# Patient Record
Sex: Male | Born: 1955 | Race: White | Hispanic: No | Marital: Single
Health system: Southern US, Academic
[De-identification: ages and names within clinical notes are randomized; demographics above are authoritative.]

## PROBLEM LIST (undated history)

## (undated) ENCOUNTER — Ambulatory Visit

## (undated) ENCOUNTER — Encounter: Attending: Family | Primary: Family

## (undated) ENCOUNTER — Encounter

## (undated) ENCOUNTER — Encounter
Attending: Student in an Organized Health Care Education/Training Program | Primary: Student in an Organized Health Care Education/Training Program

## (undated) ENCOUNTER — Ambulatory Visit
Payer: MEDICARE | Attending: Student in an Organized Health Care Education/Training Program | Primary: Student in an Organized Health Care Education/Training Program

## (undated) ENCOUNTER — Ambulatory Visit: Payer: MEDICARE

## (undated) ENCOUNTER — Telehealth

## (undated) ENCOUNTER — Encounter: Attending: Infectious Disease | Primary: Infectious Disease

## (undated) ENCOUNTER — Encounter: Payer: MEDICARE | Attending: Family | Primary: Family

## (undated) ENCOUNTER — Ambulatory Visit: Payer: MEDICARE | Attending: Psychiatry | Primary: Psychiatry

## (undated) ENCOUNTER — Ambulatory Visit: Payer: MEDICARE | Attending: Infectious Disease | Primary: Infectious Disease

## (undated) ENCOUNTER — Ambulatory Visit: Payer: MEDICARE | Attending: Family | Primary: Family

## (undated) ENCOUNTER — Encounter: Attending: Psychiatry | Primary: Psychiatry

## (undated) ENCOUNTER — Telehealth: Attending: Family | Primary: Family

## (undated) ENCOUNTER — Encounter: Payer: MEDICARE | Attending: Infectious Disease | Primary: Infectious Disease

## (undated) ENCOUNTER — Encounter
Payer: MEDICARE | Attending: Student in an Organized Health Care Education/Training Program | Primary: Student in an Organized Health Care Education/Training Program

## (undated) ENCOUNTER — Telehealth: Attending: Registered" | Primary: Registered"

## (undated) ENCOUNTER — Non-Acute Institutional Stay: Payer: MEDICARE

## (undated) ENCOUNTER — Encounter: Attending: Registered" | Primary: Registered"

## (undated) DIAGNOSIS — F32A Depression, unspecified: Secondary | ICD-10-CM

## (undated) DIAGNOSIS — B20 Human immunodeficiency virus [HIV] disease: Secondary | ICD-10-CM

## (undated) DIAGNOSIS — E785 Hyperlipidemia, unspecified: Secondary | ICD-10-CM

## (undated) DIAGNOSIS — K769 Liver disease, unspecified: Secondary | ICD-10-CM

## (undated) DIAGNOSIS — F319 Bipolar disorder, unspecified: Secondary | ICD-10-CM

## (undated) DIAGNOSIS — B182 Chronic viral hepatitis C: Secondary | ICD-10-CM

## (undated) DIAGNOSIS — J449 Chronic obstructive pulmonary disease, unspecified: Secondary | ICD-10-CM

## (undated) DIAGNOSIS — F329 Major depressive disorder, single episode, unspecified: Secondary | ICD-10-CM

## (undated) DIAGNOSIS — Z21 Asymptomatic human immunodeficiency virus [HIV] infection status: Secondary | ICD-10-CM

## (undated) HISTORY — PX: BACK SURGERY: SHX140

---
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
## 2005-03-19 ENCOUNTER — Other Ambulatory Visit: Payer: Self-pay

## 2005-03-19 ENCOUNTER — Inpatient Hospital Stay: Payer: Self-pay | Admitting: Internal Medicine

## 2011-12-06 ENCOUNTER — Emergency Department: Payer: Self-pay | Admitting: *Deleted

## 2011-12-06 LAB — COMPREHENSIVE METABOLIC PANEL
Bilirubin,Total: 0.6 mg/dL (ref 0.2–1.0)
Calcium, Total: 9.3 mg/dL (ref 8.5–10.1)
Chloride: 103 mmol/L (ref 98–107)
EGFR (Non-African Amer.): 55 — ABNORMAL LOW
Osmolality: 278 (ref 275–301)
Potassium: 3.7 mmol/L (ref 3.5–5.1)
SGOT(AST): 96 U/L — ABNORMAL HIGH (ref 15–37)
Total Protein: 8.5 g/dL — ABNORMAL HIGH (ref 6.4–8.2)

## 2011-12-06 LAB — CBC
HCT: 42.4 % (ref 40.0–52.0)
HGB: 14.2 g/dL (ref 13.0–18.0)
MCH: 32.7 pg (ref 26.0–34.0)
MCHC: 33.6 g/dL (ref 32.0–36.0)
Platelet: 158 10*3/uL (ref 150–440)
RBC: 4.36 10*6/uL — ABNORMAL LOW (ref 4.40–5.90)
RDW: 13.6 % (ref 11.5–14.5)

## 2011-12-11 LAB — CULTURE, BLOOD (SINGLE)

## 2012-11-27 ENCOUNTER — Emergency Department: Payer: Self-pay | Admitting: Emergency Medicine

## 2012-11-27 LAB — CBC
MCH: 32.6 pg (ref 26.0–34.0)
MCV: 99 fL (ref 80–100)
Platelet: 162 10*3/uL (ref 150–440)
RDW: 14 % (ref 11.5–14.5)
WBC: 5.9 10*3/uL (ref 3.8–10.6)

## 2012-11-27 LAB — BASIC METABOLIC PANEL
Co2: 24 mmol/L (ref 21–32)
Creatinine: 1.23 mg/dL (ref 0.60–1.30)
Glucose: 105 mg/dL — ABNORMAL HIGH (ref 65–99)
Osmolality: 274 (ref 275–301)

## 2013-11-29 ENCOUNTER — Emergency Department: Payer: Self-pay | Admitting: Emergency Medicine

## 2013-11-29 LAB — COMPREHENSIVE METABOLIC PANEL
ALT: 101 U/L — AB (ref 12–78)
Albumin: 3.6 g/dL (ref 3.4–5.0)
Alkaline Phosphatase: 112 U/L
Anion Gap: 5 — ABNORMAL LOW (ref 7–16)
BILIRUBIN TOTAL: 0.7 mg/dL (ref 0.2–1.0)
BUN: 9 mg/dL (ref 7–18)
CALCIUM: 9.5 mg/dL (ref 8.5–10.1)
Chloride: 102 mmol/L (ref 98–107)
Co2: 29 mmol/L (ref 21–32)
Creatinine: 1.43 mg/dL — ABNORMAL HIGH (ref 0.60–1.30)
EGFR (African American): >60
GFR CALC NON AF AMER: 53 — AB
Glucose: 105 mg/dL — ABNORMAL HIGH (ref 65–99)
Osmolality: 271 (ref 275–301)
POTASSIUM: 4.3 mmol/L (ref 3.5–5.1)
SGOT(AST): 134 U/L — ABNORMAL HIGH (ref 15–37)
Sodium: 136 mmol/L (ref 136–145)
Total Protein: 8.2 g/dL (ref 6.4–8.2)

## 2013-11-29 LAB — CBC
HCT: 44.8 % (ref 40.0–52.0)
HGB: 15.3 g/dL (ref 13.0–18.0)
MCH: 33.9 pg (ref 26.0–34.0)
MCHC: 34.1 g/dL (ref 32.0–36.0)
MCV: 99 fL (ref 80–100)
PLATELETS: 120 10*3/uL — AB (ref 150–440)
RBC: 4.5 10*6/uL (ref 4.40–5.90)
RDW: 13.7 % (ref 11.5–14.5)
WBC: 4.2 10*3/uL (ref 3.8–10.6)

## 2013-11-29 LAB — TROPONIN I

## 2013-11-29 LAB — PRO B NATRIURETIC PEPTIDE: B-TYPE NATIURETIC PEPTID: 109 pg/mL (ref 0–125)

## 2015-01-10 ENCOUNTER — Emergency Department: Payer: Self-pay | Admitting: Internal Medicine

## 2015-01-10 LAB — CBC WITH DIFFERENTIAL/PLATELET
BASOS ABS: 0.1 10*3/uL (ref 0.0–0.1)
Basophil %: 1 %
Eosinophil #: 0.3 10*3/uL (ref 0.0–0.7)
Eosinophil %: 3.5 %
HCT: 42 % (ref 40.0–52.0)
HGB: 14.1 g/dL (ref 13.0–18.0)
LYMPHS ABS: 1.7 10*3/uL (ref 1.0–3.6)
Lymphocyte %: 21 %
MCH: 34.8 pg — AB (ref 26.0–34.0)
MCHC: 33.7 g/dL (ref 32.0–36.0)
MCV: 103 fL — ABNORMAL HIGH (ref 80–100)
MONO ABS: 0.7 x10 3/mm (ref 0.2–1.0)
Monocyte %: 8.6 %
NEUTROS ABS: 5.3 10*3/uL (ref 1.4–6.5)
Neutrophil %: 65.9 %
PLATELETS: 120 10*3/uL — AB (ref 150–440)
RBC: 4.06 10*6/uL — ABNORMAL LOW (ref 4.40–5.90)
RDW: 13.8 % (ref 11.5–14.5)
WBC: 8.1 10*3/uL (ref 3.8–10.6)

## 2015-01-10 LAB — BASIC METABOLIC PANEL
Anion Gap: 7 (ref 7–16)
BUN: 13 mg/dL
CO2: 24 mmol/L
Calcium, Total: 9.1 mg/dL
Chloride: 104 mmol/L
Creatinine: 1.13 mg/dL
EGFR (African American): >60
Glucose: 104 mg/dL — ABNORMAL HIGH
Potassium: 4.1 mmol/L
Sodium: 135 mmol/L

## 2015-01-10 LAB — PROTIME-INR
INR: 1.2
Prothrombin Time: 15 secs

## 2015-01-10 LAB — APTT: Activated PTT: 31.5 secs (ref 23.6–35.9)

## 2016-01-11 ENCOUNTER — Emergency Department: Payer: Medicare Other

## 2016-01-11 ENCOUNTER — Encounter: Payer: Self-pay | Admitting: Internal Medicine

## 2016-01-11 ENCOUNTER — Inpatient Hospital Stay
Admission: EM | Admit: 2016-01-11 | Discharge: 2016-01-13 | DRG: 190 | Disposition: A | Discharge status: Home / Self Care | Payer: Medicare Other | Attending: Internal Medicine | Admitting: Internal Medicine

## 2016-01-11 DIAGNOSIS — B20 Human immunodeficiency virus [HIV] disease: Secondary | ICD-10-CM | POA: Diagnosis present

## 2016-01-11 DIAGNOSIS — A084 Viral intestinal infection, unspecified: Secondary | ICD-10-CM | POA: Diagnosis present

## 2016-01-11 DIAGNOSIS — Z716 Tobacco abuse counseling: Secondary | ICD-10-CM

## 2016-01-11 DIAGNOSIS — B182 Chronic viral hepatitis C: Secondary | ICD-10-CM | POA: Diagnosis present

## 2016-01-11 DIAGNOSIS — E785 Hyperlipidemia, unspecified: Secondary | ICD-10-CM | POA: Diagnosis present

## 2016-01-11 DIAGNOSIS — Z23 Encounter for immunization: Secondary | ICD-10-CM | POA: Diagnosis not present

## 2016-01-11 DIAGNOSIS — E871 Hypo-osmolality and hyponatremia: Secondary | ICD-10-CM | POA: Diagnosis present

## 2016-01-11 DIAGNOSIS — F17210 Nicotine dependence, cigarettes, uncomplicated: Secondary | ICD-10-CM | POA: Diagnosis present

## 2016-01-11 DIAGNOSIS — R0902 Hypoxemia: Secondary | ICD-10-CM | POA: Diagnosis present

## 2016-01-11 DIAGNOSIS — Z882 Allergy status to sulfonamides status: Secondary | ICD-10-CM

## 2016-01-11 DIAGNOSIS — J441 Chronic obstructive pulmonary disease with (acute) exacerbation: Principal | ICD-10-CM | POA: Diagnosis present

## 2016-01-11 DIAGNOSIS — Z9889 Other specified postprocedural states: Secondary | ICD-10-CM

## 2016-01-11 DIAGNOSIS — G9340 Encephalopathy, unspecified: Secondary | ICD-10-CM | POA: Diagnosis present

## 2016-01-11 DIAGNOSIS — F1010 Alcohol abuse, uncomplicated: Secondary | ICD-10-CM | POA: Diagnosis present

## 2016-01-11 DIAGNOSIS — Z888 Allergy status to other drugs, medicaments and biological substances status: Secondary | ICD-10-CM

## 2016-01-11 DIAGNOSIS — J449 Chronic obstructive pulmonary disease, unspecified: Secondary | ICD-10-CM

## 2016-01-11 DIAGNOSIS — A0811 Acute gastroenteropathy due to Norwalk agent: Secondary | ICD-10-CM

## 2016-01-11 HISTORY — DX: Human immunodeficiency virus (HIV) disease: B20

## 2016-01-11 HISTORY — DX: Bipolar disorder, unspecified: F31.9

## 2016-01-11 HISTORY — DX: Asymptomatic human immunodeficiency virus (hiv) infection status: Z21

## 2016-01-11 HISTORY — DX: Chronic obstructive pulmonary disease, unspecified: J44.9

## 2016-01-11 HISTORY — DX: Depression, unspecified: F32.A

## 2016-01-11 HISTORY — DX: Liver disease, unspecified: K76.9

## 2016-01-11 HISTORY — DX: Major depressive disorder, single episode, unspecified: F32.9

## 2016-01-11 HISTORY — DX: Chronic viral hepatitis C: B18.2

## 2016-01-11 HISTORY — DX: Hyperlipidemia, unspecified: E78.5

## 2016-01-11 LAB — URINE DRUG SCREEN, QUALITATIVE (ARMC ONLY)
Amphetamines, Ur Screen: NOT DETECTED
BARBITURATES, UR SCREEN: NOT DETECTED
BENZODIAZEPINE, UR SCRN: NOT DETECTED
COCAINE METABOLITE, UR ~~LOC~~: NOT DETECTED
Cannabinoid 50 Ng, Ur ~~LOC~~: POSITIVE — AB
MDMA (Ecstasy)Ur Screen: NOT DETECTED
METHADONE SCREEN, URINE: POSITIVE — AB
Opiate, Ur Screen: NOT DETECTED
Phencyclidine (PCP) Ur S: NOT DETECTED
TRICYCLIC, UR SCREEN: NOT DETECTED

## 2016-01-11 LAB — CBC WITH DIFFERENTIAL/PLATELET
BASOS ABS: 0 10*3/uL (ref 0–0.1)
Basophils Relative: 0 %
EOS PCT: 1 %
Eosinophils Absolute: 0.1 10*3/uL (ref 0–0.7)
HCT: 39.5 % — ABNORMAL LOW (ref 40.0–52.0)
Hemoglobin: 13.3 g/dL (ref 13.0–18.0)
LYMPHS PCT: 11 %
Lymphs Abs: 1.1 10*3/uL (ref 1.0–3.6)
MCH: 34.1 pg — AB (ref 26.0–34.0)
MCHC: 33.5 g/dL (ref 32.0–36.0)
MCV: 101.6 fL — AB (ref 80.0–100.0)
MONO ABS: 1 10*3/uL (ref 0.2–1.0)
MONOS PCT: 10 %
Neutro Abs: 7.9 10*3/uL — ABNORMAL HIGH (ref 1.4–6.5)
Neutrophils Relative %: 78 %
PLATELETS: 119 10*3/uL — AB (ref 150–440)
RBC: 3.89 MIL/uL — ABNORMAL LOW (ref 4.40–5.90)
RDW: 12.9 % (ref 11.5–14.5)
WBC: 10.2 10*3/uL (ref 3.8–10.6)

## 2016-01-11 LAB — BASIC METABOLIC PANEL
ANION GAP: 8 (ref 5–15)
BUN: 14 mg/dL (ref 6–20)
CALCIUM: 9.4 mg/dL (ref 8.9–10.3)
CO2: 24 mmol/L (ref 22–32)
CREATININE: 0.98 mg/dL (ref 0.61–1.24)
Chloride: 102 mmol/L (ref 101–111)
GFR calc Af Amer: >60 mL/min (ref >60)
GLUCOSE: 147 mg/dL — AB (ref 65–99)
Potassium: 4.1 mmol/L (ref 3.5–5.1)
Sodium: 134 mmol/L — ABNORMAL LOW (ref 135–145)

## 2016-01-11 LAB — TROPONIN I: Troponin I: 0.04 ng/mL — ABNORMAL HIGH (ref <0.031)

## 2016-01-11 LAB — ETHANOL

## 2016-01-11 LAB — BLOOD GAS, VENOUS
ACID-BASE EXCESS: 2.7 mmol/L (ref 0.0–3.0)
BICARBONATE: 29.7 meq/L — AB (ref 21.0–28.0)
FIO2: 0.21
O2 SAT: 87.3 %
PATIENT TEMPERATURE: 37
PCO2 VEN: 55 mmHg (ref 44.0–60.0)
PO2 VEN: 57 mmHg — AB (ref 31.0–45.0)
pH, Ven: 7.34 (ref 7.320–7.430)

## 2016-01-11 LAB — BRAIN NATRIURETIC PEPTIDE: B Natriuretic Peptide: 247 pg/mL — ABNORMAL HIGH (ref 0.0–100.0)

## 2016-01-11 MED ORDER — SODIUM CHLORIDE 0.9% FLUSH: 3.0000 mL | INTRAVENOUS | Status: DC | PRN

## 2016-01-11 MED ORDER — SODIUM CHLORIDE 0.9 % IV SOLN: 250.0000 mL | INTRAVENOUS | Status: DC | PRN

## 2016-01-11 MED ORDER — THIAMINE HCL 100 MG/ML IJ SOLN: 100.0000 mg | Freq: Every day | INTRAMUSCULAR | Status: DC

## 2016-01-11 MED ORDER — ACETAMINOPHEN 325 MG PO TABS: 650.0000 mg | ORAL_TABLET | Freq: Four times a day (QID) | ORAL | Status: DC | PRN

## 2016-01-11 MED ORDER — ONDANSETRON HCL 4 MG/2ML IJ SOLN
4.0000 mg | Freq: Once | INTRAMUSCULAR | Status: AC
  Administered 2016-01-11: 4 mg via INTRAVENOUS
  Filled 2016-01-11: qty 2

## 2016-01-11 MED ORDER — IPRATROPIUM-ALBUTEROL 0.5-2.5 (3) MG/3ML IN SOLN
3.0000 mL | Freq: Four times a day (QID) | RESPIRATORY_TRACT | Status: DC
Start: 2016-01-11 — End: 2016-01-11

## 2016-01-11 MED ORDER — SODIUM CHLORIDE 0.9 % IV SOLN
Freq: Once | INTRAVENOUS | Status: AC
  Administered 2016-01-11: 17:00:00 via INTRAVENOUS

## 2016-01-11 MED ORDER — TIOTROPIUM BROMIDE MONOHYDRATE 18 MCG IN CAPS
18.0000 ug | ORAL_CAPSULE | Freq: Every day | RESPIRATORY_TRACT | Status: DC
  Administered 2016-01-12 – 2016-01-13 (×2): 18 ug via RESPIRATORY_TRACT
  Filled 2016-01-11: qty 5

## 2016-01-11 MED ORDER — ENOXAPARIN SODIUM 40 MG/0.4ML ~~LOC~~ SOLN
40.0000 mg | Freq: Every day | SUBCUTANEOUS | Status: DC
  Administered 2016-01-11 – 2016-01-12 (×2): 40 mg via SUBCUTANEOUS
  Filled 2016-01-11 (×2): qty 0.4

## 2016-01-11 MED ORDER — METHYLPREDNISOLONE SODIUM SUCC 125 MG IJ SOLR
125.0000 mg | Freq: Once | INTRAMUSCULAR | Status: AC
  Administered 2016-01-11: 125 mg via INTRAVENOUS
  Filled 2016-01-11: qty 2

## 2016-01-11 MED ORDER — IPRATROPIUM-ALBUTEROL 0.5-2.5 (3) MG/3ML IN SOLN
3.0000 mL | Freq: Four times a day (QID) | RESPIRATORY_TRACT | Status: DC
  Administered 2016-01-12 (×4): 3 mL via RESPIRATORY_TRACT
  Filled 2016-01-11 (×4): qty 3

## 2016-01-11 MED ORDER — VITAMIN B-1 100 MG PO TABS
100.0000 mg | ORAL_TABLET | Freq: Every day | ORAL | Status: DC
  Administered 2016-01-11 – 2016-01-13 (×3): 100 mg via ORAL
  Filled 2016-01-11 (×3): qty 1

## 2016-01-11 MED ORDER — ALBUTEROL SULFATE (2.5 MG/3ML) 0.083% IN NEBU: 2.5000 mg | INHALATION_SOLUTION | RESPIRATORY_TRACT | Status: DC | PRN

## 2016-01-11 MED ORDER — LORAZEPAM 2 MG/ML IJ SOLN: 1.0000 mg | Freq: Four times a day (QID) | INTRAMUSCULAR | Status: DC | PRN

## 2016-01-11 MED ORDER — GUAIFENESIN-DM 100-10 MG/5ML PO SYRP: 5.0000 mL | ORAL_SOLUTION | ORAL | Status: DC | PRN

## 2016-01-11 MED ORDER — LORAZEPAM 1 MG PO TABS: 1.0000 mg | ORAL_TABLET | Freq: Four times a day (QID) | ORAL | Status: DC | PRN

## 2016-01-11 MED ORDER — ONDANSETRON HCL 4 MG/2ML IJ SOLN: 4.0000 mg | Freq: Four times a day (QID) | INTRAMUSCULAR | Status: DC | PRN

## 2016-01-11 MED ORDER — ADULT MULTIVITAMIN W/MINERALS CH
1.0000 | ORAL_TABLET | Freq: Every day | ORAL | Status: DC
  Administered 2016-01-11 – 2016-01-13 (×3): 1 via ORAL
  Filled 2016-01-11 (×3): qty 1

## 2016-01-11 MED ORDER — MOMETASONE FURO-FORMOTEROL FUM 100-5 MCG/ACT IN AERO
2.0000 | INHALATION_SPRAY | Freq: Two times a day (BID) | RESPIRATORY_TRACT | Status: DC
  Administered 2016-01-12 – 2016-01-13 (×4): 2 via RESPIRATORY_TRACT
  Filled 2016-01-11: qty 8.8

## 2016-01-11 MED ORDER — FOLIC ACID 1 MG PO TABS
1.0000 mg | ORAL_TABLET | Freq: Every day | ORAL | Status: DC
  Administered 2016-01-11 – 2016-01-13 (×3): 1 mg via ORAL
  Filled 2016-01-11 (×3): qty 1

## 2016-01-11 MED ORDER — IPRATROPIUM-ALBUTEROL 0.5-2.5 (3) MG/3ML IN SOLN
3.0000 mL | Freq: Once | RESPIRATORY_TRACT | Status: AC
  Administered 2016-01-11: 3 mL via RESPIRATORY_TRACT
  Filled 2016-01-11: qty 3

## 2016-01-11 MED ORDER — ONDANSETRON HCL 4 MG PO TABS: 4.0000 mg | ORAL_TABLET | Freq: Four times a day (QID) | ORAL | Status: DC | PRN

## 2016-01-11 MED ORDER — PNEUMOCOCCAL VAC POLYVALENT 25 MCG/0.5ML IJ INJ
0.5000 mL | INJECTION | INTRAMUSCULAR | Status: AC
  Administered 2016-01-12: 0.5 mL via INTRAMUSCULAR
  Filled 2016-01-11: qty 0.5

## 2016-01-11 MED ORDER — SODIUM CHLORIDE 0.9% FLUSH
3.0000 mL | Freq: Two times a day (BID) | INTRAVENOUS | Status: DC
  Administered 2016-01-11 – 2016-01-13 (×4): 3 mL via INTRAVENOUS

## 2016-01-11 MED ORDER — METHYLPREDNISOLONE SODIUM SUCC 125 MG IJ SOLR
60.0000 mg | Freq: Four times a day (QID) | INTRAMUSCULAR | Status: DC
  Administered 2016-01-12 (×2): 60 mg via INTRAVENOUS
  Filled 2016-01-11 (×3): qty 2

## 2016-01-11 MED ORDER — ACETAMINOPHEN 650 MG RE SUPP: 650.0000 mg | Freq: Four times a day (QID) | RECTAL | Status: DC | PRN

## 2016-01-11 NOTE — H&P (Signed)
[facility] [place] at [facility]   PATIENT NAME: Gregory Robinson Corsetti    MR#:  [id number]  DATE OF BIRTH:  04/15/1956  DATE OF ADMISSION:  01/11/2016  PRIMARY CARE PHYSICIAN: No primary care provider on file.   REQUESTING/REFERRING PHYSICIAN: Emily FilbertJonathan E Williams, MD  CHIEF COMPLAINT:   Chief Complaint  Patient presents with  . Diarrhea  . Emesis   Nausea, vomiting, diarrhea and shortness of breath 3 days. HISTORY OF PRESENT ILLNESS:  Gregory Robinson Doughten  is a 60 y.o. male with a known history of COPD, actually liver disease and hyperlipidemia. The patient came to the ED due to nausea, vomiting and diarrhea. He also complains of cough and shortness of breath. But he denies any fever or chills. He was noticed hypoxic with oxygen level at 80s. He is treated with nebulizer and oxygen in ED. Chest x-ray didn't show any infiltrate. He denies any melena or bloody stool. The patient looks confused. His sister said that it is not his baseline.  PAST MEDICAL HISTORY:   Past Medical History  Diagnosis Date  . COPD (chronic obstructive pulmonary disease) (HCC)   . HIV (human immunodeficiency virus infection) (HCC)   . Liver disease   . Hyperlipidemia   . Depression   . Bipolar affective disorder (HCC)   . Chronic hepatitis C (HCC)     PAST SURGICAL HISTORY:   Past Surgical History  Procedure Laterality Date  . Back surgery      SOCIAL HISTORY:   Social History  Substance Use Topics  . Smoking status: Current Every Day Smoker -- 1.00 packs/day for 30 years  . Smokeless tobacco: Not on file  . Alcohol Use: Yes    FAMILY HISTORY:   Family History  Problem Relation Age of Onset  . Adopted: Yes    DRUG ALLERGIES:   Allergies  Allergen Reactions  . Sulfa Antibiotics     Reaction: unknown    REVIEW OF SYSTEMS:  CONSTITUTIONAL: No fever, fatigue or weakness.  EYES: No blurred or double vision.  EARS, NOSE, AND THROAT: No tinnitus or ear pain.  RESPIRATORY:  No cough, but has shortness of breath, wheezing but no hemoptysis.  CARDIOVASCULAR: No chest pain, orthopnea, edema.  GASTROINTESTINAL: has nausea, vomiting, diarrhea but no abdominal pain.  GENITOURINARY: No dysuria, hematuria.  ENDOCRINE: No polyuria, nocturia,  HEMATOLOGY: No anemia, easy bruising or bleeding SKIN: No rash or lesion. MUSCULOSKELETAL: No joint pain or arthritis.   NEUROLOGIC: No tingling, numbness, weakness.  PSYCHIATRY: No anxiety or depression.   MEDICATIONS AT HOME:   Prior to Admission medications   Not on File      VITAL SIGNS:  Blood pressure 168/79, pulse 97, temperature 99.1 F (37.3 C), temperature source Oral, resp. rate 20, height 6\' 5"  (1.956 m), weight 111.131 kg (245 lb), SpO2 93 %.  PHYSICAL EXAMINATION:  GENERAL:  60 y.o.-year-old patient lying in the bed with no acute distress.  EYES: Pupils equal, round, reactive to light and accommodation. No scleral icterus. Extraocular muscles intact.  HEENT: Head atraumatic, normocephalic. Oropharynx and nasopharynx clear.  NECK:  Supple, no jugular venous distention. No thyroid enlargement, no tenderness.  LUNGS: Normal breath sounds bilaterally, no wheezing, rales,rhonchi or crepitation. No use of accessory muscles of respiration.  CARDIOVASCULAR: S1, S2 normal. No murmurs, rubs, or gallops.  ABDOMEN: Soft, nontender, nondistended. Bowel sounds present. No organomegaly or mass.  EXTREMITIES: No pedal edema, cyanosis, or clubbing.  NEUROLOGIC: Cranial nerves II through XII are intact.  Muscle strength 5/5 in all extremities. Sensation intact. Gait not checked.  PSYCHIATRIC: The patient is alert and oriented x 2.  SKIN: No obvious rash, lesion, or ulcer.   LABORATORY PANEL:   CBC  Recent Labs Lab 01/11/16 1627  WBC 10.2  HGB 13.3  HCT 39.5*  PLT 119*   ------------------------------------------------------------------------------------------------------------------  Chemistries   Recent  Labs Lab 01/11/16 1627  NA 134*  K 4.1  CL 102  CO2 24  GLUCOSE 147*  BUN 14  CREATININE 0.98  CALCIUM 9.4   ------------------------------------------------------------------------------------------------------------------  Cardiac Enzymes  Recent Labs Lab 01/11/16 1627  TROPONINI 0.04*   ------------------------------------------------------------------------------------------------------------------  RADIOLOGY:  Dg Chest 2 View  01/11/2016  CLINICAL DATA:  Dyspnea. EXAM: CHEST  2 VIEW COMPARISON:  November 29, 2013. FINDINGS: The heart size and mediastinal contours are within normal limits. No pneumothorax or pleural effusion is noted. Stable granuloma is noted in right upper lobe. No acute pulmonary disease is noted. The visualized skeletal structures are unremarkable. IMPRESSION: No active cardiopulmonary disease. Electronically Signed   By: Lupita Raider, M.D.   On: 01/11/2016 17:44    EKG:   Orders placed or performed during the hospital encounter of 01/11/16  . ED EKG  . ED EKG    IMPRESSION AND PLAN:   COPD exacerbation with hypoxia. Start Medrol, DuoNeb, continue oxygen by cannula, Spiriva and dulera.  Nausea, vomiting and diarrhea, possible acute gastroenteritis. Give IV fluid support and check stool test.  Altered mental status, acute encephalopathy. Unclear etiology. Aspiration and fall precaution. Check drug screen and alcohol level.  Hyponatremia. Continue normal saline, follow-up BMP.  Alcohol abuse. Start CIWA protocol. Tobacco abuse. Smoking cessation was counseled for 3 minutes, give nicotine patch.   All the records are reviewed and case discussed with ED provider. Management plans discussed with the patient, family and they are in agreement.  CODE STATUS: Full code  TOTAL TIME TAKING CARE OF THIS PATIENT: 57 minutes.    Shaune Pollack M.D on 01/11/2016 at 8:16 PM  Between 7am to 6pm - Pager - [phone]  After 6pm go to [email or web address]  - password [email or web address]  [facility]  Office  [phone]  CC: Primary care physician; No primary care provider on file.

## 2016-01-11 NOTE — ED Provider Notes (Signed)
[facility] Emergency Department Provider Note     Time seen: ----------------------------------------- 4:39 PM on 01/11/2016 -----------------------------------------    I have reviewed the triage vital signs and the nursing notes.   HISTORY  Chief Complaint Diarrhea and Emesis    HPI Gregory Robinson is a 60 y.o. male who arrives by EMS from home for complaints of nausea vomiting and diarrhea. He was noted to be hypoxic on arrival was placed on oxygen. Patient has not noted any significant shortness of breath, states he may have had respiratory problems in the past from HIV. He denies any fevers, chills, chest pain or other complaints at this time.   No past medical history on file.  There are no active problems to display for this patient.   No past surgical history on file.  Allergies Review of patient's allergies indicates not on file.  Social History Social History  Substance Use Topics  . Smoking status: Not on file  . Smokeless tobacco: Not on file  . Alcohol Use: Not on file    Review of Systems Constitutional: Negative for fever. Eyes: Negative for visual changes. ENT: Negative for sore throat. Cardiovascular: Negative for chest pain. Respiratory: Negative for shortness of breath. Gastrointestinal: Negative for abdominal pain, positive for nausea vomiting and diarrhea Genitourinary: Negative for dysuria. Musculoskeletal: Negative for back pain. Skin: Negative for rash. Neurological: Negative for headaches, focal weakness or numbness.  10-point ROS otherwise negative.  ____________________________________________   PHYSICAL EXAM:  VITAL SIGNS: ED Triage Vitals  Enc Vitals Group     BP 01/11/16 1624 110/89 mmHg     Pulse Rate 01/11/16 1624 101     Resp 01/11/16 1624 18     Temp 01/11/16 1624 99.1 F (37.3 C)     Temp Source 01/11/16 1624 Oral     SpO2 01/11/16 1624 90 %     Weight 01/11/16 1624 245 lb (111.131 kg)   Height 01/11/16 1624  (1.956 m)     Head Cir --      Peak Flow --      Pain Score --      Pain Loc --      Pain Edu? --      Excl. in GC? --     Constitutional: Alert and oriented. Well appearing and in no distress. Eyes: Conjunctivae are normal. PERRL. Normal extraocular movements. ENT   Head: Normocephalic and atraumatic.   Nose: No congestion/rhinnorhea.   Mouth/Throat: Mucous membranes are moist.   Neck: No stridor. Cardiovascular: Normal rate, regular rhythm. Normal and symmetric distal pulses are present in all extremities. No murmurs, rubs, or gallops. Respiratory: Normal respiratory effort without tachypnea nor retractions. Breath sounds are clear and equal bilaterally. No wheezes/rales/rhonchi. Gastrointestinal: Soft and nontender. No distention. No abdominal bruits.  Musculoskeletal: Nontender with normal range of motion in all extremities. No joint effusions.  No lower extremity tenderness nor edema. Neurologic:  Normal speech and language. No gross focal neurologic deficits are appreciated.  Skin:  Skin is warm, dry and intact. No rash noted. Psychiatric: Mood and affect are normal. Speech and behavior are normal. Patient exhibits appropriate insight and judgment. ____________________________________________  ED COURSE:  Pertinent labs & imaging results that were available during my care of the patient were reviewed by me and considered in my medical decision making (see chart for details). Patient is in no acute distress, will check basic labs and reevaluate. Patient received fluids antiemetics.  EKG: Interpreted by me, normal sinus rhythm  rate of 91 bpm, normal PR interval, normal QRS, normal QT interval. Left axis deviation, left anterior fascicular block ____________________________________________    LABS (pertinent positives/negatives)  Labs Reviewed  CBC WITH DIFFERENTIAL/PLATELET - Abnormal; Notable for the following:    RBC 3.89 (*)    HCT  39.5 (*)    MCV 101.6 (*)    MCH 34.1 (*)    Platelets 119 (*)    Neutro Abs 7.9 (*)    All other components within normal limits  BASIC METABOLIC PANEL - Abnormal; Notable for the following:    Sodium 134 (*)    Glucose, Bld 147 (*)    All other components within normal limits  BRAIN NATRIURETIC PEPTIDE - Abnormal; Notable for the following:    B Natriuretic Peptide 247.0 (*)    All other components within normal limits  TROPONIN I - Abnormal; Notable for the following:    Troponin I 0.04 (*)    All other components within normal limits  BLOOD GAS, VENOUS - Abnormal; Notable for the following:    pO2, Ven 57.0 (*)    Bicarbonate 29.7 (*)    All other components within normal limits    RADIOLOGY Images were viewed by me  Chest x-ray Is unremarkable ____________________________________________  FINAL ASSESSMENT AND PLAN  Norovirus, hypoxia likely secondary to COPD, elevated troponin  Plan: Patient with labs and imaging as dictated above. Patient with persistent hypoxemia off oxygen which is new for him. Unclear etiology. I don't think he has PCP pneumonia, he's not had any respiratory symptoms. We'll give him do an abscess and Solu-Medrol. His troponin is slightly elevated and this will need to be rechecked. Possible demand related. He has received IV fluids and antiemetics. Currently he is stable for admission.   Emily FilbertWilliams, Jonathan E, MD   Emily FilbertJonathan E Williams, MD 01/11/16 [phone]

## 2016-01-11 NOTE — ED Notes (Signed)
Troponin 0.04. MD notified.  

## 2016-01-11 NOTE — ED Notes (Signed)
Pt arrived via EMS from home for complaints of nausea, vomiting and diarrhea. EMS reports 83% on RA. EMS applied 2L O2, SpO2 up to 90%.

## 2016-01-12 DIAGNOSIS — J441 Chronic obstructive pulmonary disease with (acute) exacerbation: Secondary | ICD-10-CM | POA: Diagnosis not present

## 2016-01-12 LAB — BASIC METABOLIC PANEL
Anion gap: 4 — ABNORMAL LOW (ref 5–15)
BUN: 15 mg/dL (ref 6–20)
CO2: 29 mmol/L (ref 22–32)
CREATININE: 0.98 mg/dL (ref 0.61–1.24)
Calcium: 9 mg/dL (ref 8.9–10.3)
Chloride: 101 mmol/L (ref 101–111)
GFR calc non Af Amer: >60 mL/min (ref >60)
Glucose, Bld: 177 mg/dL — ABNORMAL HIGH (ref 65–99)
Potassium: 4.1 mmol/L (ref 3.5–5.1)
Sodium: 134 mmol/L — ABNORMAL LOW (ref 135–145)

## 2016-01-12 LAB — CBC
HCT: 36.2 % — ABNORMAL LOW (ref 40.0–52.0)
Hemoglobin: 12.3 g/dL — ABNORMAL LOW (ref 13.0–18.0)
MCH: 34.2 pg — AB (ref 26.0–34.0)
MCHC: 34.1 g/dL (ref 32.0–36.0)
MCV: 100.4 fL — ABNORMAL HIGH (ref 80.0–100.0)
PLATELETS: 98 10*3/uL — AB (ref 150–440)
RBC: 3.61 MIL/uL — AB (ref 4.40–5.90)
RDW: 13.2 % (ref 11.5–14.5)
WBC: 5.8 10*3/uL (ref 3.8–10.6)

## 2016-01-12 MED ORDER — PREDNISONE 10 MG (21) PO TBPK: 10.0000 mg | ORAL_TABLET | Freq: Every day | ORAL | Status: DC

## 2016-01-12 MED ORDER — IPRATROPIUM-ALBUTEROL 0.5-2.5 (3) MG/3ML IN SOLN
3.0000 mL | Freq: Three times a day (TID) | RESPIRATORY_TRACT | Status: DC
  Administered 2016-01-13: 3 mL via RESPIRATORY_TRACT
  Filled 2016-01-12: qty 3

## 2016-01-12 MED ORDER — PREDNISONE 20 MG PO TABS
40.0000 mg | ORAL_TABLET | Freq: Every day | ORAL | Status: DC
  Administered 2016-01-13: 08:00:00 40 mg via ORAL
  Filled 2016-01-12: qty 2

## 2016-01-12 MED ORDER — TIOTROPIUM BROMIDE MONOHYDRATE 18 MCG IN CAPS: 18.0000 ug | ORAL_CAPSULE | Freq: Every day | RESPIRATORY_TRACT | Status: DC

## 2016-01-12 MED ORDER — ZOLPIDEM TARTRATE 5 MG PO TABS
10.0000 mg | ORAL_TABLET | Freq: Every evening | ORAL | Status: DC | PRN
  Administered 2016-01-12: 23:00:00 10 mg via ORAL
  Filled 2016-01-12: qty 2

## 2016-01-12 MED ORDER — MOMETASONE FURO-FORMOTEROL FUM 100-5 MCG/ACT IN AERO: 2.0000 | INHALATION_SPRAY | Freq: Two times a day (BID) | RESPIRATORY_TRACT | Status: DC

## 2016-01-12 NOTE — Discharge Summary (Signed)
[facility] [place] at [facility]   PATIENT NAME: Gregory Robinson    MR#:  [id number]  DATE OF BIRTH:  11-Oct-1956  DATE OF ADMISSION:  01/11/2016 ADMITTING PHYSICIAN: Shaune Pollack, MD  DATE OF DISCHARGE: No discharge date for patient encounter.  PRIMARY CARE PHYSICIAN: No primary care provider on file.    ADMISSION DIAGNOSIS:  COPD exacerbation Nausea vomiting diarrhea  DISCHARGE DIAGNOSIS:  Principal Problem:   COPD exacerbation (HCC)  viral gastroenteritis  SECONDARY DIAGNOSIS:   Past Medical History  Diagnosis Date  . COPD (chronic obstructive pulmonary disease) (HCC)   . HIV (human immunodeficiency virus infection) (HCC)   . Liver disease   . Hyperlipidemia   . Depression   . Bipolar affective disorder (HCC)   . Chronic hepatitis C [facility])     HOSPITAL COURSE:  Gregory Robinson  is a 60 y.o. male admitted 01/11/2016 with chief complaint nausea vomiting diarrhea and shortness of breath. Please see H&P performed by Dr. Imogene Burn for further information. Originally patient required oxygen on arrival. However after receiving breathing treatments steroids since symptoms have remarkably improved. He is currently ambulating around the room without oxygen. Has had no further symptoms including nausea vomiting diarrhea or breathing issues. Stable and improved  DISCHARGE CONDITIONS:   Improved/stable   CONSULTS OBTAINED:     DRUG ALLERGIES:   Allergies  Allergen Reactions  . Sulfa Antibiotics     Reaction: unknown    DISCHARGE MEDICATIONS:   Current Discharge Medication List    START taking these medications   Details  mometasone-formoterol (DULERA) 100-5 MCG/ACT AERO Inhale 2 puffs into the lungs 2 (two) times daily. Qty: 8.8 g, Refills: 0    predniSONE (STERAPRED UNI-PAK 21 TAB) 10 MG (21) TBPK tablet Take 1 tablet (10 mg total) by mouth daily.  oral 1 day, then  oral for 2 days, then  oral 2 days, then stop Qty: 10 tablet, Refills: 0     tiotropium (SPIRIVA) 18 MCG inhalation capsule Place 1 capsule (18 mcg total) into inhaler and inhale daily. Qty: 30 capsule, Refills: 12         DISCHARGE INSTRUCTIONS:    DIET:  Regular diet  DISCHARGE CONDITION:  Stable  ACTIVITY:  Activity as tolerated  OXYGEN:  Home Oxygen: No.   Oxygen Delivery: room air  DISCHARGE LOCATION:  home   If you experience worsening of your admission symptoms, develop shortness of breath, life threatening emergency, suicidal or homicidal thoughts you must seek medical attention immediately by calling 911 or calling your MD immediately  if symptoms less severe.  You Must read complete instructions/literature along with all the possible adverse reactions/side effects for all the Medicines you take and that have been prescribed to you. Take any new Medicines after you have completely understood and accpet all the possible adverse reactions/side effects.   Please note  You were cared for by a hospitalist during your hospital stay. If you have any questions about your discharge medications or the care you received while you were in the hospital after you are discharged, you can call the unit and asked to speak with the hospitalist on call if the hospitalist that took care of you is not available. Once you are discharged, your primary care physician will handle any further medical issues. Please note that NO REFILLS for any discharge medications will be authorized once you are discharged, as it is imperative that you return to your primary care physician (or establish a relationship  with a primary care physician if you do not have one) for your aftercare needs so that they can reassess your need for medications and monitor your lab values.    On the day of Discharge:   VITAL SIGNS:  Blood pressure 139/69, pulse 89, temperature 98.8 F (37.1 C), temperature source Oral, resp. rate 18, height 6\' 5"  (1.956 m), weight 112.311 kg (247 lb 9.6 oz),  SpO2 94 %.  I/O:   Intake/Output Summary (Last 24 hours) at 01/12/16 1004 Last data filed at 01/11/16 2300  Gross per 24 hour  Intake    120 ml  Output    400 ml  Net   -280 ml    PHYSICAL EXAMINATION:  GENERAL:  60 y.o.-year-old patient lying in the bed with no acute distress.  EYES: Pupils equal, round, reactive to light and accommodation. No scleral icterus. Extraocular muscles intact.  HEENT: Head atraumatic, normocephalic. Oropharynx and nasopharynx clear.  NECK:  Supple, no jugular venous distention. No thyroid enlargement, no tenderness.  LUNGS: Normal breath sounds bilaterally, no wheezing, rales,rhonchi or crepitation. No use of accessory muscles of respiration.  CARDIOVASCULAR: S1, S2 normal. No murmurs, rubs, or gallops.  ABDOMEN: Soft, non-tender, non-distended. Bowel sounds present. No organomegaly or mass.  EXTREMITIES: No pedal edema, cyanosis, or clubbing.  NEUROLOGIC: Cranial nerves II through XII are intact. Muscle strength 5/5 in all extremities. Sensation intact. Gait not checked.  PSYCHIATRIC: The patient is alert and oriented x 3.  SKIN: No obvious rash, lesion, or ulcer.   DATA REVIEW:   CBC  Recent Labs Lab 01/12/16 0444  WBC 5.8  HGB 12.3*  HCT 36.2*  PLT 98*    Chemistries   Recent Labs Lab 01/12/16 0444  NA 134*  K 4.1  CL 101  CO2 29  GLUCOSE 177*  BUN 15  CREATININE 0.98  CALCIUM 9.0    Cardiac Enzymes  Recent Labs Lab 01/11/16 1627  TROPONINI 0.04*    Microbiology Results  Results for orders placed or performed in visit on 12/06/11  Culture, blood (single)     Status: None   Collection Time: 12/06/11 12:57 AM  Result Value Ref Range Status   Micro Text Report   Final       COMMENT                   NO GROWTH AEROBICALLY/ANAEROBICALLY IN 5 DAYS   ANTIBIOTIC                                                      Culture, blood (single)     Status: None   Collection Time: 12/06/11 12:57 AM  Result Value Ref Range  Status   Micro Text Report   Final       COMMENT                   NO GROWTH AEROBICALLY/ANAEROBICALLY IN 5 DAYS   ANTIBIOTIC                                                        RADIOLOGY:  Dg Chest 2 View  01/11/2016  CLINICAL DATA:  Dyspnea. EXAM: CHEST  2 VIEW COMPARISON:  November 29, 2013. FINDINGS: The heart size and mediastinal contours are within normal limits. No pneumothorax or pleural effusion is noted. Stable granuloma is noted in right upper lobe. No acute pulmonary disease is noted. The visualized skeletal structures are unremarkable. IMPRESSION: No active cardiopulmonary disease. Electronically Signed   By: Lupita Raider, M.D.   On: 01/11/2016 17:44     Management plans discussed with the patient, family and they are in agreement.  CODE STATUS:     Code Status Orders        Start     Ordered   01/11/16 2131  Full code   Continuous     01/11/16 2130    Code Status History    Date Active Date Inactive Code Status Order ID Comments User Context   This patient has a current code status but no historical code status.      TOTAL TIME TAKING CARE OF THIS PATIENT: 28 minutes.    Hower,  Mardi Mainland.D on 01/12/2016 at 10:04 AM  Between 7am to 6pm - Pager - [phone]  After 6pm go to [email or web address] - password [email or web address] [facility]  [place] [facility]  Office  [phone]  CC: Primary care physician; No primary care provider on file.

## 2016-01-12 NOTE — Progress Notes (Signed)
Tobacco abuse counseling: Time spent greater than 10 minutes, Patient smokes approximately one pack daily but about quitting before we talked about various options including nicotine supplementation, medications, at this time patient prefers to attempt using electronic cigarettes-stated that this method is not officially proven however it does provide decrease exposure to toxins it is better than his current situation but not as good as complete abstinence

## 2016-01-12 NOTE — Progress Notes (Signed)
Patient desaturate to 85% room air at rest... Will hold on discharge today

## 2016-01-12 NOTE — Care Management Important Message (Signed)
Important Message  Patient Details  Name: Gregory Rochesterric Adriance MRN: [id number] Date of Birth: 08/18/1956   Medicare Important Message Given:  Yes    Gwenette GreetBrenda S Kurstyn Larios, RN 01/12/2016, 11:20 AM

## 2016-01-12 NOTE — Plan of Care (Signed)
Problem: Discharge Progression Outcomes Goal: O2 sats > or equal 90% or at baseline Outcome: Not Progressing Around noon O2 sats in the mid 80's on room air. 3L O2 per [place] reapplied, O2 sats up to 93%. MD aware.

## 2016-01-12 NOTE — Care Management (Signed)
Admitted to [facility] with the diagnosis of COPD. Lives with friend, Gregory Robinson [phone])Takes care of all basic and instrumental activities of daily living himself, drives. Home oxygen through [employer] 20 years ago. Uses no aids for ambulation. Retired from [employer] 2003. No falls. No seizure activity. Good appetite. Either friend, Gregory Robinson or sister will transport. Gwenette GreetBrenda S Kaile Bixler RN MSN CCM Care Management [phone]

## 2016-01-12 NOTE — Progress Notes (Signed)
[facility] - [place] at [facility]   PATIENT NAME: Gregory Robinson    MR#:  [id number]  DATE OF BIRTH:  05/24/1956  SUBJECTIVE:  No complaints this morning no further episodes of diarrhea  REVIEW OF SYSTEMS:  CONSTITUTIONAL: No fever, fatigue or weakness.  EYES: No blurred or double vision.  EARS, NOSE, AND THROAT: No tinnitus or ear pain.  RESPIRATORY: No cough, shortness of breath, wheezing or hemoptysis.  CARDIOVASCULAR: No chest pain, orthopnea, edema.  GASTROINTESTINAL: No nausea, vomiting, diarrhea or abdominal pain.  GENITOURINARY: No dysuria, hematuria.  ENDOCRINE: No polyuria, nocturia,  HEMATOLOGY: No anemia, easy bruising or bleeding SKIN: No rash or lesion. MUSCULOSKELETAL: No joint pain or arthritis.   NEUROLOGIC: No tingling, numbness, weakness.  PSYCHIATRY: No anxiety or depression.   DRUG ALLERGIES:   Allergies  Allergen Reactions  . Sulfa Antibiotics     Reaction: unknown    VITALS:  Blood pressure 155/63, pulse 85, temperature 99.1 F (37.3 C), temperature source Oral, resp. rate 18, height 6\' 5"  (1.956 m), weight 112.311 kg (247 lb 9.6 oz), SpO2 88 %.  PHYSICAL EXAMINATION:  VITAL SIGNS: Filed Vitals:   01/12/16 0441 01/12/16 1326  BP: 139/69 155/63  Pulse: 89 85  Temp: 98.8 F (37.1 C) 99.1 F (37.3 C)  Resp: 18 18   GENERAL:59 y.o.male currently in no acute distress.  HEAD: Normocephalic, atraumatic.  EYES: Pupils equal, round, reactive to light. Extraocular muscles intact. No scleral icterus.  MOUTH: Moist mucosal membrane. Dentition intact. No abscess noted.  EAR, NOSE, THROAT: Clear without exudates. No external lesions.  NECK: Supple. No thyromegaly. No nodules. No JVD.  PULMONARY: Clear to ascultation, without wheeze rails or rhonci. No use of accessory muscles, Good respiratory effort. good air entry bilaterally CHEST: Nontender to palpation.  CARDIOVASCULAR: S1 and S2. Regular rate and rhythm. No murmurs, rubs, or  gallops. No edema. Pedal pulses 2+ bilaterally.  GASTROINTESTINAL: Soft, nontender, nondistended. No masses. Positive bowel sounds. No hepatosplenomegaly.  MUSCULOSKELETAL: No swelling, clubbing, or edema. Range of motion full in all extremities.  NEUROLOGIC: Cranial nerves II through XII are intact. No gross focal neurological deficits. Sensation intact. Reflexes intact.  SKIN: No ulceration, lesions, rashes, or cyanosis. Skin warm and dry. Turgor intact.  PSYCHIATRIC: Mood, affect within normal limits. The patient is awake, alert and oriented x 3. Insight, judgment intact.      LABORATORY PANEL:   CBC  Recent Labs Lab 01/12/16 0444  WBC 5.8  HGB 12.3*  HCT 36.2*  PLT 98*   ------------------------------------------------------------------------------------------------------------------  Chemistries   Recent Labs Lab 01/12/16 0444  NA 134*  K 4.1  CL 101  CO2 29  GLUCOSE 177*  BUN 15  CREATININE 0.98  CALCIUM 9.0   ------------------------------------------------------------------------------------------------------------------  Cardiac Enzymes  Recent Labs Lab 01/11/16 1627  TROPONINI 0.04*   ------------------------------------------------------------------------------------------------------------------  RADIOLOGY:  Dg Chest 2 View  01/11/2016  CLINICAL DATA:  Dyspnea. EXAM: CHEST  2 VIEW COMPARISON:  November 29, 2013. FINDINGS: The heart size and mediastinal contours are within normal limits. No pneumothorax or pleural effusion is noted. Stable granuloma is noted in right upper lobe. No acute pulmonary disease is noted. The visualized skeletal structures are unremarkable. IMPRESSION: No active cardiopulmonary disease. Electronically Signed   By: Lupita RaiderJames  Green Jr, M.D.   On: 01/11/2016 17:44    EKG:   Orders placed or performed during the hospital encounter of 01/11/16  . ED EKG  . ED EKG    ASSESSMENT AND  PLAN:   60 year old Caucasian gentleman  admitted with nausea vomiting diarrhea shortness of breath  1. COPD exacerbation: Continue with steroids and supplemental oxygen as required patient doing remarkably well this morning however on a recheck of oxygen desaturated to mid 80s on room air will continue with steroids breathing treatments 2. Nausea vomiting diarrhea: Symptoms resolved 3. Hyponatremia: IV fluid hydration     All the records are reviewed and case discussed with Care Management/Social Workerr. Management plans discussed with the patient, family and they are in agreement.  CODE STATUS: Full  TOTAL TIME TAKING CARE OF THIS PATIENT: 28 minutes.   POSSIBLE D/C IN 1 DAYS, DEPENDING ON CLINICAL CONDITION.   Hazelynn Mckenny,  Mardi Mainland.D on 01/12/2016 at 3:42 PM  Between 7am to 6pm - Pager - [phone]  After 6pm: House Pager: - [phone]  [facility]  Office  [phone]  CC: Primary care physician; No primary care provider on file.

## 2016-01-13 NOTE — Progress Notes (Signed)
Received MD order to discharge patient to home, reviewed home medications, prescriptions and discharge instructions with patient and patient verbalized understanding discharged to home in wheelchair with sister by nursing

## 2016-01-13 NOTE — Discharge Summary (Signed)
[facility] - [place] at [facility]   PATIENT NAME: Gregory Robinson    MR#:  [id number]  DATE OF BIRTH:  1956-02-15  DATE OF ADMISSION:  01/11/2016 ADMITTING PHYSICIAN: Shaune Pollack, MD  DATE OF DISCHARGE: No discharge date for patient encounter.  PRIMARY CARE PHYSICIAN: No primary care provider on file.    ADMISSION DIAGNOSIS:  COPD exacerbation Nausea vomiting diarrhea  DISCHARGE DIAGNOSIS:  Principal Problem:   COPD exacerbation (HCC)  viral gastroenteritis  SECONDARY DIAGNOSIS:   Past Medical History  Diagnosis Date  . COPD (chronic obstructive pulmonary disease) (HCC)   . HIV (human immunodeficiency virus infection) (HCC)   . Liver disease   . Hyperlipidemia   . Depression   . Bipolar affective disorder (HCC)   . Chronic hepatitis C [facility])     HOSPITAL COURSE:  Gregory Robinson  is a 60 y.o. male admitted 01/11/2016 with chief complaint nausea vomiting diarrhea and shortness of breath. Please see H&P performed by Dr. Imogene Burn for further information. Originally patient required oxygen on arrival. However after receiving breathing treatments steroids since symptoms have remarkably improved. He is currently ambulating around the room without oxygen. Has had no further symptoms including nausea vomiting diarrhea or breathing issues. Stable and improved  DISCHARGE CONDITIONS:   Improved/stable   CONSULTS OBTAINED:     DRUG ALLERGIES:   Allergies  Allergen Reactions  . Sulfa Antibiotics     Reaction: unknown    DISCHARGE MEDICATIONS:   Current Discharge Medication List    START taking these medications   Details  mometasone-formoterol (DULERA) 100-5 MCG/ACT AERO Inhale 2 puffs into the lungs 2 (two) times daily. Qty: 8.8 g, Refills: 0    predniSONE (STERAPRED UNI-PAK 21 TAB) 10 MG (21) TBPK tablet Take 1 tablet (10 mg total) by mouth daily.  oral 1 day, then  oral for 2 days, then  oral 2 days, then stop Qty: 10 tablet, Refills: 0     tiotropium (SPIRIVA) 18 MCG inhalation capsule Place 1 capsule (18 mcg total) into inhaler and inhale daily. Qty: 30 capsule, Refills: 12         DISCHARGE INSTRUCTIONS:    DIET:  Regular diet  DISCHARGE CONDITION:  Stable  ACTIVITY:  Activity as tolerated  OXYGEN:  Home Oxygen: No.   Oxygen Delivery: room air  DISCHARGE LOCATION:  home   If you experience worsening of your admission symptoms, develop shortness of breath, life threatening emergency, suicidal or homicidal thoughts you must seek medical attention immediately by calling 911 or calling your MD immediately  if symptoms less severe.  You Must read complete instructions/literature along with all the possible adverse reactions/side effects for all the Medicines you take and that have been prescribed to you. Take any new Medicines after you have completely understood and accpet all the possible adverse reactions/side effects.   Please note  You were cared for by a hospitalist during your hospital stay. If you have any questions about your discharge medications or the care you received while you were in the hospital after you are discharged, you can call the unit and asked to speak with the hospitalist on call if the hospitalist that took care of you is not available. Once you are discharged, your primary care physician will handle any further medical issues. Please note that NO REFILLS for any discharge medications will be authorized once you are discharged, as it is imperative that you return to your primary care physician (or establish a relationship  with a primary care physician if you do not have one) for your aftercare needs so that they can reassess your need for medications and monitor your lab values.    On the day of Discharge:   VITAL SIGNS:  Blood pressure 151/61, pulse 100, temperature 98.4 F (36.9 C), temperature source Oral, resp. rate 20, height 6\' 5"  (1.956 m), weight 112.311 kg (247 lb 9.6 oz),  SpO2 94 %.  I/O:    Intake/Output Summary (Last 24 hours) at 01/13/16 1146 Last data filed at 01/13/16 0116  Gross per 24 hour  Intake      0 ml  Output   1300 ml  Net  -1300 ml    PHYSICAL EXAMINATION:  GENERAL:  60 y.o.-year-old patient lying in the bed with no acute distress.  EYES: Pupils equal, round, reactive to light and accommodation. No scleral icterus. Extraocular muscles intact.  HEENT: Head atraumatic, normocephalic. Oropharynx and nasopharynx clear.  NECK:  Supple, no jugular venous distention. No thyroid enlargement, no tenderness.  LUNGS: Normal breath sounds bilaterally, no wheezing, rales,rhonchi or crepitation. No use of accessory muscles of respiration.  CARDIOVASCULAR: S1, S2 normal. No murmurs, rubs, or gallops.  ABDOMEN: Soft, non-tender, non-distended. Bowel sounds present. No organomegaly or mass.  EXTREMITIES: No pedal edema, cyanosis, or clubbing.  NEUROLOGIC: Cranial nerves II through XII are intact. Muscle strength 5/5 in all extremities. Sensation intact. Gait not checked.  PSYCHIATRIC: The patient is alert and oriented x 3.  SKIN: No obvious rash, lesion, or ulcer.   DATA REVIEW:   CBC  Recent Labs Lab 01/12/16 0444  WBC 5.8  HGB 12.3*  HCT 36.2*  PLT 98*    Chemistries   Recent Labs Lab 01/12/16 0444  NA 134*  K 4.1  CL 101  CO2 29  GLUCOSE 177*  BUN 15  CREATININE 0.98  CALCIUM 9.0    Cardiac Enzymes  Recent Labs Lab 01/11/16 1627  TROPONINI 0.04*    Microbiology Results  Results for orders placed or performed in visit on 12/06/11  Culture, blood (single)     Status: None   Collection Time: 12/06/11 12:57 AM  Result Value Ref Range Status   Micro Text Report   Final       COMMENT                   NO GROWTH AEROBICALLY/ANAEROBICALLY IN 5 DAYS   ANTIBIOTIC                                                      Culture, blood (single)     Status: None   Collection Time: 12/06/11 12:57 AM  Result Value Ref Range  Status   Micro Text Report   Final       COMMENT                   NO GROWTH AEROBICALLY/ANAEROBICALLY IN 5 DAYS   ANTIBIOTIC                                                        RADIOLOGY:  Dg Chest 2 View  01/11/2016  CLINICAL DATA:  Dyspnea. EXAM: CHEST  2 VIEW COMPARISON:  November 29, 2013. FINDINGS: The heart size and mediastinal contours are within normal limits. No pneumothorax or pleural effusion is noted. Stable granuloma is noted in right upper lobe. No acute pulmonary disease is noted. The visualized skeletal structures are unremarkable. IMPRESSION: No active cardiopulmonary disease. Electronically Signed   By: Lupita Raider, M.D.   On: 01/11/2016 17:44     Management plans discussed with the patient, family and they are in agreement.  CODE STATUS:     Code Status Orders        Start     Ordered   01/11/16 2131  Full code   Continuous     01/11/16 2130    Code Status History    Date Active Date Inactive Code Status Order ID Comments User Context   This patient has a current code status but no historical code status.      TOTAL TIME TAKING CARE OF THIS PATIENT: 28 minutes.    Angel Hobdy,  Mardi Mainland.D on 01/13/2016 at 11:46 AM  Between 7am to 6pm - Pager - [phone]  After 6pm go to [email or web address] - password [email or web address] [facility]  [place] [facility]  Office  [phone]  CC: Primary care physician; No primary care provider on file.

## 2016-03-16 NOTE — Unmapped (Signed)
Received two notifications from [facility] regarding dolutegravir and lamotrigine. Notifications stated that there was concern that the patient was non-adherent to these medications based on prescription refill history.     Lamotrigine was last filled on 02/26/16 and this has been filled regularly per [facility] pharmacy.    Patient recently changed from integrase inhibitors on 01/22/16 from [facility]ent[facility] to Tivicay to simplify regimen to daily administration. Prescription refill history has been accurate with fill dates and patient reports adherence. HIV VL will be checked at next office visit on 03/30/16.    No acute issues in regards to non-adherence to these medications.     Andrew Oconnell. Junius Creamer, PharmD, BCPS, AAHIVP, CPP  PGY2 Infectious Diseases Specialty Pharmacy Resident  Pager: [phone]

## 2017-05-02 ENCOUNTER — Ambulatory Visit
Admission: RE | Admit: 2017-05-02 | Discharge: 2017-05-02 | Disposition: A | Discharge status: Home / Self Care | Payer: MEDICARE | Admitting: Urology

## 2017-05-02 DIAGNOSIS — N401 Enlarged prostate with lower urinary tract symptoms: Principal | ICD-10-CM

## 2017-05-02 DIAGNOSIS — B20 Human immunodeficiency virus [HIV] disease: Secondary | ICD-10-CM

## 2017-05-02 DIAGNOSIS — Z125 Encounter for screening for malignant neoplasm of prostate: Secondary | ICD-10-CM

## 2017-05-02 MED ORDER — TAMSULOSIN 0.4 MG CAPSULE
ORAL_CAPSULE | Freq: Every day | ORAL | 3 refills | 0 days | Status: CP
Start: 2017-05-02 — End: 2018-05-02

## 2017-07-10 ENCOUNTER — Ambulatory Visit
Admission: RE | Admit: 2017-07-10 | Discharge: 2017-07-10 | Disposition: A | Discharge status: Home / Self Care | Payer: MEDICARE

## 2017-07-10 ENCOUNTER — Ambulatory Visit
Admission: RE | Admit: 2017-07-10 | Discharge: 2017-07-10 | Disposition: A | Discharge status: Home / Self Care | Payer: MEDICARE | Attending: Family | Admitting: Family

## 2017-07-10 DIAGNOSIS — R1310 Dysphagia, unspecified: Secondary | ICD-10-CM

## 2017-07-10 DIAGNOSIS — Z129 Encounter for screening for malignant neoplasm, site unspecified: Secondary | ICD-10-CM

## 2017-07-10 DIAGNOSIS — B20 Human immunodeficiency virus [HIV] disease: Secondary | ICD-10-CM

## 2017-07-10 DIAGNOSIS — N401 Enlarged prostate with lower urinary tract symptoms: Secondary | ICD-10-CM

## 2017-07-10 DIAGNOSIS — H53029 Refractive amblyopia, unspecified eye: Secondary | ICD-10-CM

## 2017-07-10 DIAGNOSIS — E7800 Pure hypercholesterolemia, unspecified: Secondary | ICD-10-CM

## 2017-07-10 DIAGNOSIS — B182 Chronic viral hepatitis C: Secondary | ICD-10-CM

## 2017-07-10 DIAGNOSIS — L853 Xerosis cutis: Secondary | ICD-10-CM

## 2017-07-10 DIAGNOSIS — H44119 Panuveitis, unspecified eye: Secondary | ICD-10-CM

## 2017-07-10 DIAGNOSIS — K7460 Unspecified cirrhosis of liver: Principal | ICD-10-CM

## 2017-07-10 DIAGNOSIS — N189 Chronic kidney disease, unspecified: Secondary | ICD-10-CM

## 2017-07-10 DIAGNOSIS — M25512 Pain in left shoulder: Secondary | ICD-10-CM

## 2017-07-10 DIAGNOSIS — Z720 Tobacco use: Secondary | ICD-10-CM

## 2017-07-10 DIAGNOSIS — F3170 Bipolar disorder, currently in remission, most recent episode unspecified: Secondary | ICD-10-CM

## 2017-07-10 DIAGNOSIS — R0602 Shortness of breath: Secondary | ICD-10-CM

## 2017-07-10 DIAGNOSIS — G8929 Other chronic pain: Secondary | ICD-10-CM

## 2017-07-10 DIAGNOSIS — N521 Erectile dysfunction due to diseases classified elsewhere: Secondary | ICD-10-CM

## 2017-07-10 DIAGNOSIS — J42 Unspecified chronic bronchitis: Secondary | ICD-10-CM

## 2017-07-10 DIAGNOSIS — F5101 Primary insomnia: Secondary | ICD-10-CM

## 2017-07-10 DIAGNOSIS — I77810 Thoracic aortic ectasia: Secondary | ICD-10-CM

## 2017-07-18 ENCOUNTER — Ambulatory Visit
Admission: RE | Admit: 2017-07-18 | Discharge: 2017-07-18 | Disposition: A | Discharge status: Home / Self Care | Payer: MEDICARE | Attending: Family | Admitting: Family

## 2017-07-18 DIAGNOSIS — B182 Chronic viral hepatitis C: Principal | ICD-10-CM

## 2017-07-18 DIAGNOSIS — K7460 Unspecified cirrhosis of liver: Secondary | ICD-10-CM

## 2017-07-18 DIAGNOSIS — F329 Major depressive disorder, single episode, unspecified: Secondary | ICD-10-CM

## 2017-08-02 ENCOUNTER — Ambulatory Visit: Admission: RE | Admit: 2017-08-02 | Discharge: 2017-08-02 | Payer: MEDICARE | Attending: Clinical | Admitting: Clinical

## 2017-08-02 ENCOUNTER — Ambulatory Visit: Admission: RE | Admit: 2017-08-02 | Discharge: 2017-08-02 | Payer: MEDICARE

## 2017-08-02 DIAGNOSIS — F4320 Adjustment disorder, unspecified: Secondary | ICD-10-CM

## 2017-08-02 DIAGNOSIS — Z634 Disappearance and death of family member: Secondary | ICD-10-CM

## 2017-08-02 DIAGNOSIS — F329 Major depressive disorder, single episode, unspecified: Principal | ICD-10-CM

## 2017-08-24 ENCOUNTER — Ambulatory Visit: Admission: RE | Admit: 2017-08-24 | Discharge: 2017-08-24

## 2017-08-24 DIAGNOSIS — Z006 Encounter for examination for normal comparison and control in clinical research program: Principal | ICD-10-CM

## 2017-08-30 MED ORDER — TRAZODONE 150 MG TABLET
ORAL_TABLET | 6 refills | 0 days | Status: CP
Start: 2017-08-30 — End: 2018-02-13

## 2017-09-19 MED ORDER — LAMOTRIGINE 200 MG TABLET
ORAL_TABLET | 5 refills | 0 days | Status: CP
Start: 2017-09-19 — End: 2018-02-13

## 2017-10-24 ENCOUNTER — Encounter
Admit: 2017-10-24 | Discharge: 2017-10-25 | Payer: MEDICARE | Attending: Infectious Disease | Primary: Infectious Disease

## 2017-10-24 DIAGNOSIS — B182 Chronic viral hepatitis C: Secondary | ICD-10-CM

## 2017-10-24 DIAGNOSIS — B20 Human immunodeficiency virus [HIV] disease: Principal | ICD-10-CM

## 2017-11-23 MED ORDER — LOSARTAN 25 MG TABLET
ORAL_TABLET | 11 refills | 0 days | Status: CP
Start: 2017-11-23 — End: 2018-12-12

## 2017-12-11 MED ORDER — EMTRICITABINE 200 MG-TENOFOVIR ALAFENAMIDE FUMARATE 25 MG TABLET
ORAL_TABLET | Freq: Every day | ORAL | 11 refills | 0.00000 days | Status: CP
Start: 2017-12-11 — End: 2018-12-12

## 2017-12-11 MED ORDER — DOLUTEGRAVIR 50 MG TABLET
ORAL_TABLET | Freq: Every day | ORAL | 11 refills | 0.00000 days | Status: CP
Start: 2017-12-11 — End: 2018-12-12

## 2017-12-26 MED ORDER — ATORVASTATIN 10 MG TABLET
ORAL_TABLET | 6 refills | 0 days | Status: CP
Start: 2017-12-26 — End: 2018-08-29

## 2018-01-23 ENCOUNTER — Encounter: Admit: 2018-01-23 | Discharge: 2018-01-23 | Payer: MEDICARE

## 2018-01-23 ENCOUNTER — Encounter: Admit: 2018-01-23 | Discharge: 2018-01-23 | Payer: MEDICARE | Attending: Family | Primary: Family

## 2018-01-23 DIAGNOSIS — K7460 Unspecified cirrhosis of liver: Principal | ICD-10-CM

## 2018-01-23 DIAGNOSIS — Z13820 Encounter for screening for osteoporosis: Secondary | ICD-10-CM

## 2018-01-23 DIAGNOSIS — M899 Disorder of bone, unspecified: Secondary | ICD-10-CM

## 2018-01-24 MED ORDER — ERGOCALCIFEROL (VITAMIN D2) 1,250 MCG (50,000 UNIT) CAPSULE
ORAL_CAPSULE | ORAL | 2 refills | 0 days | Status: CP
Start: 2018-01-24 — End: 2019-03-18

## 2018-02-13 MED ORDER — LAMOTRIGINE 200 MG TABLET
ORAL_TABLET | 5 refills | 0 days | Status: CP
Start: 2018-02-13 — End: 2018-08-15

## 2018-02-13 MED ORDER — TRAZODONE 150 MG TABLET
ORAL_TABLET | 6 refills | 0 days | Status: CP
Start: 2018-02-13 — End: 2018-09-03

## 2018-03-13 ENCOUNTER — Encounter
Admit: 2018-03-13 | Discharge: 2018-03-13 | Payer: MEDICARE | Attending: Infectious Disease | Primary: Infectious Disease

## 2018-03-13 DIAGNOSIS — J42 Unspecified chronic bronchitis: Secondary | ICD-10-CM

## 2018-03-13 DIAGNOSIS — B20 Human immunodeficiency virus [HIV] disease: Principal | ICD-10-CM

## 2018-03-13 DIAGNOSIS — B182 Chronic viral hepatitis C: Secondary | ICD-10-CM

## 2018-06-25 ENCOUNTER — Encounter: Admit: 2018-06-25 | Discharge: 2018-06-26 | Payer: MEDICARE

## 2018-06-25 DIAGNOSIS — J069 Acute upper respiratory infection, unspecified: Principal | ICD-10-CM

## 2018-06-25 DIAGNOSIS — J0100 Acute maxillary sinusitis, unspecified: Secondary | ICD-10-CM

## 2018-06-25 DIAGNOSIS — B20 Human immunodeficiency virus [HIV] disease: Secondary | ICD-10-CM

## 2018-06-25 MED ORDER — AMOXICILLIN 875 MG-POTASSIUM CLAVULANATE 125 MG TABLET
ORAL_TABLET | Freq: Two times a day (BID) | ORAL | 0 refills | 0 days | Status: CP
Start: 2018-06-25 — End: 2018-07-02

## 2018-07-31 ENCOUNTER — Ambulatory Visit: Admit: 2018-07-31 | Discharge: 2018-07-31 | Payer: MEDICARE

## 2018-07-31 ENCOUNTER — Encounter: Admit: 2018-07-31 | Discharge: 2018-07-31 | Payer: MEDICARE

## 2018-07-31 ENCOUNTER — Ambulatory Visit: Admit: 2018-07-31 | Discharge: 2018-07-31 | Payer: MEDICARE | Attending: Family | Primary: Family

## 2018-07-31 DIAGNOSIS — Z13820 Encounter for screening for osteoporosis: Secondary | ICD-10-CM

## 2018-07-31 DIAGNOSIS — R1310 Dysphagia, unspecified: Secondary | ICD-10-CM

## 2018-07-31 DIAGNOSIS — M899 Disorder of bone, unspecified: Principal | ICD-10-CM

## 2018-07-31 DIAGNOSIS — K222 Esophageal obstruction: Principal | ICD-10-CM

## 2018-07-31 DIAGNOSIS — Z23 Encounter for immunization: Secondary | ICD-10-CM

## 2018-07-31 DIAGNOSIS — K7460 Unspecified cirrhosis of liver: Secondary | ICD-10-CM

## 2018-07-31 DIAGNOSIS — Z8619 Personal history of other infectious and parasitic diseases: Secondary | ICD-10-CM

## 2018-08-16 MED ORDER — LAMOTRIGINE 200 MG TABLET
5 refills | 0 days | Status: CP
Start: 2018-08-16 — End: 2018-12-12

## 2018-08-29 MED ORDER — ATORVASTATIN 10 MG TABLET
5 refills | 0 days | Status: CP
Start: 2018-08-29 — End: 2019-01-31

## 2018-09-04 MED ORDER — TRAZODONE 150 MG TABLET
ORAL_TABLET | 6 refills | 0 days | Status: CP
Start: 2018-09-04 — End: 2019-03-17

## 2018-10-12 ENCOUNTER — Ambulatory Visit: Admit: 2018-10-12 | Discharge: 2018-10-12 | Payer: MEDICARE

## 2018-10-12 ENCOUNTER — Encounter
Admit: 2018-10-12 | Discharge: 2018-10-12 | Payer: MEDICARE | Attending: Certified Registered" | Primary: Certified Registered"

## 2018-10-12 DIAGNOSIS — R1310 Dysphagia, unspecified: Principal | ICD-10-CM

## 2018-10-23 ENCOUNTER — Ambulatory Visit
Admit: 2018-10-23 | Discharge: 2018-10-24 | Payer: MEDICARE | Attending: Infectious Disease | Primary: Infectious Disease

## 2018-10-23 DIAGNOSIS — B182 Chronic viral hepatitis C: Secondary | ICD-10-CM

## 2018-10-23 DIAGNOSIS — J209 Acute bronchitis, unspecified: Secondary | ICD-10-CM

## 2018-10-23 DIAGNOSIS — F5101 Primary insomnia: Secondary | ICD-10-CM

## 2018-10-23 DIAGNOSIS — Z720 Tobacco use: Secondary | ICD-10-CM

## 2018-10-23 DIAGNOSIS — B20 Human immunodeficiency virus [HIV] disease: Secondary | ICD-10-CM

## 2018-10-23 MED ORDER — AZITHROMYCIN 250 MG TABLET
ORAL_TABLET | Freq: Every day | ORAL | 0 refills | 0 days | Status: CP
Start: 2018-10-23 — End: 2019-03-18

## 2018-12-12 MED ORDER — DESCOVY 200 MG-25 MG TABLET: tablet | 11 refills | 0 days | Status: AC

## 2018-12-12 MED ORDER — LOSARTAN 25 MG TABLET
ORAL_TABLET | Freq: Every day | ORAL | 11 refills | 0 days | Status: CP
Start: 2018-12-12 — End: 2019-06-10

## 2018-12-12 MED ORDER — LAMOTRIGINE 200 MG TABLET
Freq: Every day | ORAL | 5 refills | 0 days | Status: CP
Start: 2018-12-12 — End: 2019-05-15

## 2018-12-12 MED ORDER — TIVICAY 50 MG TABLET
Freq: Every day | ORAL | 0 refills | 0.00000 days | Status: CP
Start: 2018-12-12 — End: 2019-03-18

## 2018-12-12 MED ORDER — TIVICAY 50 MG TABLET: 50 mg | tablet | Freq: Every day | 11 refills | 0 days | Status: AC

## 2018-12-12 MED ORDER — DESCOVY 200 MG-25 MG TABLET
ORAL_TABLET | Freq: Every day | ORAL | 11 refills | 0.00000 days | Status: CP
Start: 2018-12-12 — End: 2019-03-18

## 2019-01-31 MED ORDER — ATORVASTATIN 10 MG TABLET
5 refills | 0 days | Status: CP
Start: 2019-01-31 — End: 2020-01-31

## 2019-02-18 ENCOUNTER — Telehealth: Admit: 2019-02-18 | Discharge: 2019-02-18 | Payer: MEDICARE | Attending: Family | Primary: Family

## 2019-02-18 ENCOUNTER — Ambulatory Visit: Admit: 2019-02-18 | Discharge: 2019-02-18 | Payer: MEDICARE

## 2019-02-18 DIAGNOSIS — K7460 Unspecified cirrhosis of liver: Principal | ICD-10-CM

## 2019-03-17 MED ORDER — TRAZODONE 150 MG TABLET
ORAL_TABLET | 6 refills | 0 days | Status: CP
Start: 2019-03-17 — End: ?

## 2019-03-18 ENCOUNTER — Encounter: Admit: 2019-03-18 | Discharge: 2019-03-19 | Payer: MEDICARE | Attending: Family | Primary: Family

## 2019-03-18 DIAGNOSIS — Z8619 Personal history of other infectious and parasitic diseases: Secondary | ICD-10-CM

## 2019-03-18 DIAGNOSIS — K7460 Unspecified cirrhosis of liver: Principal | ICD-10-CM

## 2019-03-18 DIAGNOSIS — E559 Vitamin D deficiency, unspecified: Secondary | ICD-10-CM

## 2019-03-19 ENCOUNTER — Encounter: Admit: 2019-03-19 | Discharge: 2019-03-19 | Payer: MEDICARE

## 2019-03-19 DIAGNOSIS — M549 Dorsalgia, unspecified: Principal | ICD-10-CM

## 2019-03-19 DIAGNOSIS — Z8619 Personal history of other infectious and parasitic diseases: Secondary | ICD-10-CM

## 2019-03-19 DIAGNOSIS — K7460 Unspecified cirrhosis of liver: Principal | ICD-10-CM

## 2019-03-19 DIAGNOSIS — E559 Vitamin D deficiency, unspecified: Secondary | ICD-10-CM

## 2019-03-19 MED ORDER — DICLOFENAC 1 % TOPICAL GEL
Freq: Four times a day (QID) | TOPICAL | 0 refills | 0 days | Status: CP | PRN
Start: 2019-03-19 — End: 2019-03-26

## 2019-04-11 ENCOUNTER — Ambulatory Visit: Admit: 2019-04-11 | Discharge: 2019-04-12 | Payer: MEDICARE

## 2019-04-11 ENCOUNTER — Encounter: Admit: 2019-04-11 | Discharge: 2019-04-12 | Payer: MEDICARE

## 2019-04-11 DIAGNOSIS — S22080S Wedge compression fracture of T11-T12 vertebra, sequela: Secondary | ICD-10-CM

## 2019-04-11 DIAGNOSIS — M549 Dorsalgia, unspecified: Principal | ICD-10-CM

## 2019-04-11 DIAGNOSIS — M5136 Other intervertebral disc degeneration, lumbar region: Secondary | ICD-10-CM

## 2019-04-11 MED ORDER — METHYLPREDNISOLONE 4 MG TABLETS IN A DOSE PACK
PACK | 0 refills | 0 days | Status: CP
Start: 2019-04-11 — End: ?

## 2019-05-15 MED ORDER — LAMOTRIGINE 200 MG TABLET
5 refills | 0 days | Status: CP
Start: 2019-05-15 — End: ?

## 2019-09-09 DIAGNOSIS — E7800 Pure hypercholesterolemia, unspecified: Principal | ICD-10-CM

## 2019-09-17 MED ORDER — ATORVASTATIN 10 MG TABLET
5 refills | 0 days | Status: CP
Start: 2019-09-17 — End: ?

## 2019-09-23 DIAGNOSIS — B20 Human immunodeficiency virus [HIV] disease: Principal | ICD-10-CM

## 2019-09-23 DIAGNOSIS — E7800 Pure hypercholesterolemia, unspecified: Principal | ICD-10-CM

## 2019-09-24 MED ORDER — LOSARTAN 25 MG TABLET
ORAL_TABLET | Freq: Every day | ORAL | 11 refills | 90 days | Status: CP
Start: 2019-09-24 — End: 2020-03-22

## 2019-09-24 MED ORDER — TRAZODONE 150 MG TABLET
ORAL_TABLET | 6 refills | 0 days | Status: CP
Start: 2019-09-24 — End: ?

## 2019-09-24 MED ORDER — ATORVASTATIN 10 MG TABLET
Freq: Every day | ORAL | 5 refills | 30.00000 days | Status: CP
Start: 2019-09-24 — End: ?

## 2019-09-24 MED ORDER — DESCOVY 200 MG-25 MG TABLET
ORAL_TABLET | Freq: Every day | ORAL | 11 refills | 30.00000 days | Status: CP
Start: 2019-09-24 — End: ?

## 2019-09-24 MED ORDER — TIVICAY 50 MG TABLET
ORAL_TABLET | Freq: Every day | ORAL | 11 refills | 30 days | Status: CP
Start: 2019-09-24 — End: ?

## 2019-10-13 ENCOUNTER — Other Ambulatory Visit: Payer: Self-pay

## 2019-10-13 ENCOUNTER — Emergency Department: Payer: Medicare Other

## 2019-10-13 ENCOUNTER — Emergency Department
Admission: EM | Admit: 2019-10-13 | Discharge: 2019-10-13 | Disposition: A | Discharge status: Home / Self Care | Payer: Medicare Other | Attending: Emergency Medicine | Admitting: Emergency Medicine

## 2019-10-13 ENCOUNTER — Encounter: Payer: Self-pay | Admitting: Emergency Medicine

## 2019-10-13 DIAGNOSIS — J069 Acute upper respiratory infection, unspecified: Secondary | ICD-10-CM | POA: Insufficient documentation

## 2019-10-13 DIAGNOSIS — B20 Human immunodeficiency virus [HIV] disease: Secondary | ICD-10-CM | POA: Diagnosis not present

## 2019-10-13 DIAGNOSIS — F17210 Nicotine dependence, cigarettes, uncomplicated: Secondary | ICD-10-CM | POA: Diagnosis not present

## 2019-10-13 DIAGNOSIS — J449 Chronic obstructive pulmonary disease, unspecified: Secondary | ICD-10-CM | POA: Insufficient documentation

## 2019-10-13 DIAGNOSIS — Z20822 Contact with and (suspected) exposure to covid-19: Secondary | ICD-10-CM

## 2019-10-13 DIAGNOSIS — Z20828 Contact with and (suspected) exposure to other viral communicable diseases: Secondary | ICD-10-CM | POA: Diagnosis not present

## 2019-10-13 DIAGNOSIS — R05 Cough: Secondary | ICD-10-CM | POA: Insufficient documentation

## 2019-10-13 DIAGNOSIS — R0981 Nasal congestion: Secondary | ICD-10-CM | POA: Diagnosis present

## 2019-10-13 LAB — BASIC METABOLIC PANEL
Anion gap: 9 (ref 5–15)
BUN: 13 mg/dL (ref 8–23)
CO2: 22 mmol/L (ref 22–32)
Calcium: 8.6 mg/dL — ABNORMAL LOW (ref 8.9–10.3)
Chloride: 108 mmol/L (ref 98–111)
Creatinine, Ser: 0.93 mg/dL (ref 0.61–1.24)
GFR calc Af Amer: >60 mL/min (ref >60)
GFR calc non Af Amer: >60 mL/min (ref >60)
Glucose, Bld: 119 mg/dL — ABNORMAL HIGH (ref 70–99)
Potassium: 3.3 mmol/L — ABNORMAL LOW (ref 3.5–5.1)
Sodium: 139 mmol/L (ref 135–145)

## 2019-10-13 LAB — CBC
HCT: 39.2 % (ref 39.0–52.0)
Hemoglobin: 13.8 g/dL (ref 13.0–17.0)
MCH: 34.9 pg — ABNORMAL HIGH (ref 26.0–34.0)
MCHC: 35.2 g/dL (ref 30.0–36.0)
MCV: 99.2 fL (ref 80.0–100.0)
Platelets: 152 10*3/uL (ref 150–400)
RBC: 3.95 MIL/uL — ABNORMAL LOW (ref 4.22–5.81)
RDW: 12 % (ref 11.5–15.5)
WBC: 5 10*3/uL (ref 4.0–10.5)
nRBC: 0 % (ref 0.0–0.2)

## 2019-10-13 LAB — SARS CORONAVIRUS 2 (TAT 6-24 HRS): SARS Coronavirus 2: NEGATIVE

## 2019-10-13 LAB — POC SARS CORONAVIRUS 2 AG: SARS Coronavirus 2 Ag: NEGATIVE

## 2019-10-13 MED ORDER — POTASSIUM CHLORIDE CRYS ER 20 MEQ PO TBCR
40.0000 meq | EXTENDED_RELEASE_TABLET | Freq: Once | ORAL | Status: AC
  Administered 2019-10-13: 14:00:00 40 meq via ORAL
  Filled 2019-10-13: qty 2

## 2019-10-13 MED ORDER — ACETAMINOPHEN-CODEINE 120-12 MG/5ML PO SUSP: 5.0000 mL | Freq: Four times a day (QID) | ORAL | 0 refills | Status: AC | PRN

## 2019-10-13 MED ORDER — IPRATROPIUM-ALBUTEROL 0.5-2.5 (3) MG/3ML IN SOLN
3.0000 mL | Freq: Once | RESPIRATORY_TRACT | Status: AC
  Administered 2019-10-13: 3 mL via RESPIRATORY_TRACT
  Filled 2019-10-13: qty 3

## 2019-10-13 MED ORDER — PREDNISONE 10 MG PO TABS: ORAL_TABLET | ORAL | 0 refills | Status: DC

## 2019-10-13 MED ORDER — SODIUM CHLORIDE 0.9 % IV SOLN
1.0000 g | Freq: Once | INTRAVENOUS | Status: AC
  Administered 2019-10-13: 1 g via INTRAVENOUS
  Filled 2019-10-13: qty 10

## 2019-10-13 MED ORDER — METHYLPREDNISOLONE SODIUM SUCC 125 MG IJ SOLR
80.0000 mg | Freq: Once | INTRAMUSCULAR | Status: AC
Start: 2019-10-13 — End: 2019-10-13
  Administered 2019-10-13: 12:00:00 80 mg via INTRAVENOUS
  Filled 2019-10-13: qty 2

## 2019-10-13 MED ORDER — AZITHROMYCIN 250 MG PO TABS: ORAL_TABLET | ORAL | 0 refills | Status: DC

## 2019-10-13 NOTE — ED Provider Notes (Signed)
[facility] Emergency Department Provider Note  ____________________________________________  Time seen: Approximately 11:46 AM  I have reviewed the triage vital signs and the nursing notes.   HISTORY  Chief Complaint URI    HPI Gregory Robinson is a 63 y.o. male that presents to the emergency department for evaluation of nasal congestion and productive cough with yellow sputum for 5 days.  Patient states that symptoms started as nasal congestion and is now moving into his chest.  Patient has a history of COPD and does have an Advair inhaler.  Patient has a history of HIV and is on medications.  He has a follow-up appointment with his infectious disease doctor week.  He has had chills but no definite fever.  No shortness of breath, chest pain.   Past Medical History:  Diagnosis Date  . Bipolar affective disorder (HCC)   . Chronic hepatitis C (HCC)   . COPD (chronic obstructive pulmonary disease) (HCC)   . Depression   . HIV (human immunodeficiency virus infection) (HCC)   . Hyperlipidemia   . Liver disease     Patient Active Problem List   Diagnosis Date Noted  . COPD exacerbation (HCC) 01/11/2016    Past Surgical History:  Procedure Laterality Date  . BACK SURGERY      Prior to Admission medications   Medication Sig Start Date End Date Taking? Authorizing Provider  acetaminophen-codeine 120-12 MG/5ML suspension Take 5 mLs by mouth every 6 (six) hours as needed for pain. 10/13/19 10/12/20  [place] DerryWagner, Cimone Fahey, PA-C  azithromycin (ZITHROMAX Z-PAK) 250 MG tablet Take 2 tablets (500 mg) on  Day 1,  followed by 1 tablet (250 mg) once daily on Days 2 through 5. 10/13/19   [place] DerryWagner, Elleigh Cassetta, PA-C  mometasone-formoterol (DULERA) 100-5 MCG/ACT AERO Inhale 2 puffs into the lungs 2 (two) times daily. 01/12/16   Hower, Cletis Athensavid K, MD  predniSONE (DELTASONE) 10 MG tablet Take 6 tablets on day 1, take 5 tablets on day 2, take 4 tablets on day 3, take 3 tablets on day 4, take 2  tablets on day 5, take 1 tablet on day 6 10/13/19   [place] DerryWagner, Jacoby Ritsema, PA-C  tiotropium (SPIRIVA) 18 MCG inhalation capsule Place 1 capsule (18 mcg total) into inhaler and inhale daily. 01/12/16   Hower, Cletis Athensavid K, MD    Allergies Sulfa antibiotics  Family History  Adopted: Yes    Social History Social History   Tobacco Use  . Smoking status: Current Every Day Smoker    Packs/day: 1.00    Years: 30.00    Pack years: 30.00  . Smokeless tobacco: Never Used  Substance Use Topics  . Alcohol use: Yes    Alcohol/week: 2.0 standard drinks    Types: 2 Shots of liquor per week  . Drug use: No     Review of Systems  Constitutional: No fever Eyes: No visual changes. No discharge. ENT: Positive for congestion and rhinorrhea. Cardiovascular: No chest pain. Respiratory: Positive for cough. No SOB. Gastrointestinal: No abdominal pain.  No nausea, no vomiting.  No diarrhea.  No constipation. Musculoskeletal: Negative for musculoskeletal pain. Skin: Negative for rash, abrasions, lacerations, ecchymosis. Neurological: Negative for headaches.   ____________________________________________   PHYSICAL EXAM:  VITAL SIGNS: ED Triage Vitals  Enc Vitals Group     BP 10/13/19 1131 (!) 166/75     Pulse Rate 10/13/19 1131 99     Resp 10/13/19 1131 18     Temp 10/13/19 1131 98.4 F (36.9 C)  Temp Source 10/13/19 1131 Oral     SpO2 10/13/19 1131 95 %     Weight 10/13/19 1100 247 lb 9.2 oz (112.3 kg)     Height --      Head Circumference --      Peak Flow --      Pain Score 10/13/19 1100 0     Pain Loc --      Pain Edu? --      Excl. in GC? --      Constitutional: Alert and oriented. Well appearing and in no acute distress. Eyes: Conjunctivae are normal. PERRL. EOMI. No discharge. Head: Atraumatic. ENT: No frontal and maxillary sinus tenderness.      Ears: Tympanic membranes pearly gray with good landmarks. No discharge.      Nose: Mild congestion/rhinnorhea.      Mouth/Throat:  Mucous membranes are moist. Oropharynx non-erythematous. Tonsils not enlarged. No exudates. Uvula midline. Neck: No stridor.   Hematological/Lymphatic/Immunilogical: No cervical lymphadenopathy. Cardiovascular: Normal rate, regular rhythm.  Good peripheral circulation. Respiratory: Normal respiratory effort without tachypnea or retractions. Lungs CTAB. Good air entry to the bases with no decreased or absent breath sounds. Gastrointestinal: Bowel sounds 4 quadrants. Soft and nontender to palpation. No guarding or rigidity. No palpable masses. No distention. Musculoskeletal: Full range of motion to all extremities. No gross deformities appreciated. Neurologic:  Normal speech and language. No gross focal neurologic deficits are appreciated.  Skin:  Skin is warm, dry and intact. No rash noted. Psychiatric: Mood and affect are normal. Speech and behavior are normal. Patient exhibits appropriate insight and judgement.   ____________________________________________   LABS (all labs ordered are listed, but only abnormal results are displayed)  Labs Reviewed  CBC - Abnormal; Notable for the following components:      Result Value   RBC 3.95 (*)    MCH 34.9 (*)    All other components within normal limits  BASIC METABOLIC PANEL - Abnormal; Notable for the following components:   Potassium 3.3 (*)    Glucose, Bld 119 (*)    Calcium 8.6 (*)    All other components within normal limits  SARS CORONAVIRUS 2 (TAT 6-24 HRS)  POC SARS CORONAVIRUS 2 AG -  ED  POC SARS CORONAVIRUS 2 AG   ____________________________________________  EKG   ____________________________________________  RADIOLOGY Lexine Baton, personally viewed and evaluated these images (plain radiographs) as part of my medical decision making, as well as reviewing the written report by the radiologist.  DG Chest Portable 1 View  Result Date: 10/13/2019 CLINICAL DATA:  Cough, COPD, HIV EXAM: PORTABLE CHEST 1 VIEW  COMPARISON:  Portable exam 1134 hours compared to 01/11/2016 FINDINGS: Normal heart size, mediastinal contours, and pulmonary vascularity. Atherosclerotic calcification aorta. Emphysematous and bronchitic changes consistent with COPD. Scarring RIGHT base. No definite infiltrate, pleural effusion or pneumothorax. Calcified granuloma RIGHT lung. Question vague nodular density 10 mm LEFT upper lobe on one view is not seen on second view likely a summation artifact. IMPRESSION: COPD changes without acute infiltrate. Electronically Signed   By: Ulyses Southward M.D.   On: 10/13/2019 11:51    ____________________________________________    PROCEDURES  Procedure(s) performed:    Procedures    Medications  methylPREDNISolone sodium succinate (SOLU-MEDROL) 125 mg/2 mL injection 80 mg (80 mg Intravenous Given 10/13/19 1219)  cefTRIAXone (ROCEPHIN) 1 g in sodium chloride 0.9 % 100 mL IVPB (0 g Intravenous Stopped 10/13/19 1340)  ipratropium-albuterol (DUONEB) 0.5-2.5 (3) MG/3ML nebulizer solution 3 mL (3 mLs  Nebulization Given 10/13/19 1225)  ipratropium-albuterol (DUONEB) 0.5-2.5 (3) MG/3ML nebulizer solution 3 mL (3 mLs Nebulization Given 10/13/19 1343)  potassium chloride SA (KLOR-CON) CR tablet 40 mEq (40 mEq Oral Given 10/13/19 1343)     ____________________________________________   INITIAL IMPRESSION / ASSESSMENT AND PLAN / ED COURSE  Pertinent labs & imaging results that were available during my care of the patient were reviewed by me and considered in my medical decision making (see chart for details).  Review of the [place] CSRS was performed in accordance of the [place] prior to dispensing any controlled drugs.     Patient's diagnosis is consistent with URI with cough and congestion. Vital signs and exam are reassuring.  Chest x-ray negative for acute cardiopulmonary processes.  POC Covid test is negative.  PCR Covid test is pending.  Patient was given a dose of potassium for mildly low  potassium of 3.3.  CBC and CMP are reassuring.  Patient overall feels well.  He does not wish to stay in the hospital.  He feels good and would like to go home.  Patient appears well and is staying well hydrated. Patient feels comfortable going home. Patient will be discharged home with prescriptions for azithromycin and prednisone. Patient is to follow up with primary care and infectious disease as needed or otherwise directed. Patient is given ED precautions to return to the ED for any worsening or new symptoms.  Gregory Robinson was evaluated in Emergency Department on 10/13/2019 for the symptoms described in the history of present illness. He was evaluated in the context of the global COVID-19 pandemic, which necessitated consideration that the patient might be at risk for infection with the SARS-CoV-2 virus that causes COVID-19. Institutional protocols and algorithms that pertain to the evaluation of patients at risk for COVID-19 are in a state of rapid change based on information released by regulatory bodies including the CDC and federal and state organizations. These policies and algorithms were followed during the patient's care in the ED.   __________________________________  FINAL CLINICAL IMPRESSION(S) / ED DIAGNOSES  Final diagnoses:  URI with cough and congestion  Encounter for screening laboratory testing for COVID-19 virus      NEW MEDICATIONS STARTED DURING THIS VISIT:  ED Discharge Orders         Ordered    azithromycin (ZITHROMAX Z-PAK) 250 MG tablet     10/13/19 1409    predniSONE (DELTASONE) 10 MG tablet     10/13/19 1409    acetaminophen-codeine 120-12 MG/5ML suspension  Every 6 hours PRN     10/13/19 1409              This chart was dictated using voice recognition software/Dragon. Despite best efforts to proofread, errors can occur which can change the meaning. Any change was purely unintentional.    Laban Emperor, PA-C 10/13/19 1551    Duffy Bruce,  MD 10/15/19 1036

## 2019-10-13 NOTE — ED Triage Notes (Signed)
C/O cough and sinus congestion x 3 days.  AAOx3.  Skin warm and dry.  No SOB/ DOE.  NAD

## 2019-10-13 NOTE — Discharge Instructions (Addendum)
There is no pneumonia on your chest xray. Your covid results will be available in 1-2 days. You can begin antibiotics tonight. Begin steroids tomorrow. Continue using your inhalers. Call primary care or infectious disease for a followup appointment in 2 days. Return to the emergency department for worsening symptoms.

## 2019-10-22 ENCOUNTER — Ambulatory Visit
Admit: 2019-10-22 | Discharge: 2019-10-23 | Payer: MEDICARE | Attending: Infectious Disease | Primary: Infectious Disease

## 2019-10-22 DIAGNOSIS — B20 Human immunodeficiency virus [HIV] disease: Principal | ICD-10-CM

## 2019-10-22 DIAGNOSIS — B182 Chronic viral hepatitis C: Principal | ICD-10-CM

## 2019-11-05 MED ORDER — TRAZODONE 150 MG TABLET
ORAL_TABLET | Freq: Every evening | ORAL | 6 refills | 30.00000 days | Status: CP
Start: 2019-11-05 — End: ?

## 2019-11-05 MED ORDER — LAMOTRIGINE 200 MG TABLET
Freq: Every day | ORAL | 5 refills | 30.00000 days | Status: CP
Start: 2019-11-05 — End: ?

## 2019-11-25 ENCOUNTER — Encounter: Admit: 2019-11-25 | Discharge: 2019-11-26 | Payer: MEDICARE | Attending: Family | Primary: Family

## 2019-11-25 DIAGNOSIS — Z9189 Other specified personal risk factors, not elsewhere classified: Secondary | ICD-10-CM

## 2019-11-25 DIAGNOSIS — I8500 Esophageal varices without bleeding: Principal | ICD-10-CM

## 2019-11-25 DIAGNOSIS — Z8619 Personal history of other infectious and parasitic diseases: Principal | ICD-10-CM

## 2019-11-25 DIAGNOSIS — Z122 Encounter for screening for malignant neoplasm of respiratory organs: Principal | ICD-10-CM

## 2019-11-25 DIAGNOSIS — R911 Solitary pulmonary nodule: Principal | ICD-10-CM

## 2019-11-25 DIAGNOSIS — Z1321 Encounter for screening for nutritional disorder: Principal | ICD-10-CM

## 2019-11-25 DIAGNOSIS — Z720 Tobacco use: Principal | ICD-10-CM

## 2019-11-25 DIAGNOSIS — K7460 Unspecified cirrhosis of liver: Principal | ICD-10-CM

## 2019-12-02 ENCOUNTER — Encounter: Admit: 2019-12-02 | Discharge: 2019-12-03 | Payer: MEDICARE

## 2019-12-12 ENCOUNTER — Encounter: Admit: 2019-12-12 | Discharge: 2019-12-13 | Payer: MEDICARE

## 2020-01-06 DIAGNOSIS — B20 Human immunodeficiency virus [HIV] disease: Principal | ICD-10-CM

## 2020-01-06 MED ORDER — DESCOVY 200 MG-25 MG TABLET
ORAL_TABLET | 0 refills | 0 days | Status: CP
Start: 2020-01-06 — End: ?

## 2020-01-07 ENCOUNTER — Encounter: Admit: 2020-01-07 | Discharge: 2020-01-08 | Payer: MEDICARE

## 2020-01-29 ENCOUNTER — Encounter: Admit: 2020-01-29 | Discharge: 2020-01-30 | Payer: MEDICARE | Attending: Family | Primary: Family

## 2020-01-31 ENCOUNTER — Encounter: Admit: 2020-01-31 | Discharge: 2020-01-31 | Payer: MEDICARE

## 2020-02-10 DIAGNOSIS — B20 Human immunodeficiency virus [HIV] disease: Principal | ICD-10-CM

## 2020-02-14 MED ORDER — DESCOVY 200 MG-25 MG TABLET
ORAL_TABLET | 0 refills | 0 days | Status: CP
Start: 2020-02-14 — End: ?

## 2020-02-25 DIAGNOSIS — E7800 Pure hypercholesterolemia, unspecified: Principal | ICD-10-CM

## 2020-02-25 MED ORDER — ATORVASTATIN 10 MG TABLET
ORAL_TABLET | 3 refills | 0 days | Status: CP
Start: 2020-02-25 — End: ?

## 2020-03-03 MED ORDER — TRAZODONE 150 MG TABLET
ORAL_TABLET | 11 refills | 0 days | Status: CP
Start: 2020-03-03 — End: ?

## 2020-03-16 MED ORDER — LAMOTRIGINE 200 MG TABLET
ORAL_TABLET | 3 refills | 0 days | Status: CP
Start: 2020-03-16 — End: ?

## 2020-03-23 DIAGNOSIS — B20 Human immunodeficiency virus [HIV] disease: Principal | ICD-10-CM

## 2020-03-25 ENCOUNTER — Encounter: Admit: 2020-03-25 | Discharge: 2020-03-26 | Payer: MEDICARE

## 2020-03-25 DIAGNOSIS — Z006 Encounter for examination for normal comparison and control in clinical research program: Principal | ICD-10-CM

## 2020-03-25 MED ORDER — DESCOVY 200 MG-25 MG TABLET
ORAL_TABLET | 0 refills | 0 days | Status: CP
Start: 2020-03-25 — End: ?

## 2020-04-22 DIAGNOSIS — B20 Human immunodeficiency virus [HIV] disease: Principal | ICD-10-CM

## 2020-04-23 MED ORDER — DESCOVY 200 MG-25 MG TABLET
ORAL_TABLET | 0 refills | 0 days | Status: CP
Start: 2020-04-23 — End: ?

## 2020-05-26 ENCOUNTER — Encounter
Admit: 2020-05-26 | Discharge: 2020-05-27 | Payer: MEDICARE | Attending: Infectious Disease | Primary: Infectious Disease

## 2020-05-26 DIAGNOSIS — B20 Human immunodeficiency virus [HIV] disease: Principal | ICD-10-CM

## 2020-05-26 DIAGNOSIS — J310 Chronic rhinitis: Principal | ICD-10-CM

## 2020-05-26 DIAGNOSIS — B182 Chronic viral hepatitis C: Principal | ICD-10-CM

## 2020-05-26 MED ORDER — DESCOVY 200 MG-25 MG TABLET
ORAL_TABLET | Freq: Every day | ORAL | 11 refills | 30 days | Status: CP
Start: 2020-05-26 — End: ?

## 2020-05-26 MED ORDER — TIVICAY 50 MG TABLET
ORAL_TABLET | Freq: Every day | ORAL | 11 refills | 30.00000 days | Status: CP
Start: 2020-05-26 — End: ?

## 2020-06-23 ENCOUNTER — Ambulatory Visit: Admit: 2020-06-23 | Discharge: 2020-06-24 | Payer: MEDICARE

## 2020-06-23 ENCOUNTER — Ambulatory Visit
Admit: 2020-06-23 | Discharge: 2020-06-24 | Payer: MEDICARE | Attending: Student in an Organized Health Care Education/Training Program | Primary: Student in an Organized Health Care Education/Training Program

## 2020-06-23 DIAGNOSIS — J310 Chronic rhinitis: Principal | ICD-10-CM

## 2020-06-23 DIAGNOSIS — B20 Human immunodeficiency virus [HIV] disease: Principal | ICD-10-CM

## 2020-07-06 ENCOUNTER — Encounter: Admit: 2020-07-06 | Discharge: 2020-07-07 | Payer: MEDICARE

## 2020-07-06 DIAGNOSIS — Z720 Tobacco use: Principal | ICD-10-CM

## 2020-07-06 DIAGNOSIS — Z87891 Personal history of nicotine dependence: Principal | ICD-10-CM

## 2020-07-06 DIAGNOSIS — J449 Chronic obstructive pulmonary disease, unspecified: Principal | ICD-10-CM

## 2020-07-06 MED ORDER — STIOLTO RESPIMAT 2.5 MCG-2.5 MCG/ACTUATION SOLUTION FOR INHALATION
Freq: Every day | RESPIRATORY_TRACT | 3 refills | 14.00000 days | Status: CP
Start: 2020-07-06 — End: ?

## 2020-07-06 MED ORDER — ALBUTEROL SULFATE HFA 90 MCG/ACTUATION AEROSOL INHALER
Freq: Four times a day (QID) | RESPIRATORY_TRACT | 3 refills | 0.00000 days | Status: CP | PRN
Start: 2020-07-06 — End: ?

## 2020-07-15 DIAGNOSIS — B20 Human immunodeficiency virus [HIV] disease: Principal | ICD-10-CM

## 2020-07-15 DIAGNOSIS — J449 Chronic obstructive pulmonary disease, unspecified: Principal | ICD-10-CM

## 2020-07-16 DIAGNOSIS — J449 Chronic obstructive pulmonary disease, unspecified: Principal | ICD-10-CM

## 2020-07-16 MED ORDER — ALBUTEROL SULFATE HFA 90 MCG/ACTUATION AEROSOL INHALER
Freq: Four times a day (QID) | RESPIRATORY_TRACT | 3 refills | 0 days | Status: CP | PRN
Start: 2020-07-16 — End: ?

## 2020-07-16 MED ORDER — STIOLTO RESPIMAT 2.5 MCG-2.5 MCG/ACTUATION SOLUTION FOR INHALATION
Freq: Every day | RESPIRATORY_TRACT | 3 refills | 14.00000 days | Status: CP
Start: 2020-07-16 — End: ?

## 2020-07-17 MED ORDER — DESCOVY 200 MG-25 MG TABLET
ORAL_TABLET | Freq: Every day | ORAL | 11 refills | 30.00000 days | Status: CP
Start: 2020-07-17 — End: 2021-07-17

## 2020-07-17 MED ORDER — TIVICAY 50 MG TABLET
ORAL_TABLET | Freq: Every day | ORAL | 11 refills | 30.00000 days | Status: CP
Start: 2020-07-17 — End: ?

## 2020-07-21 DIAGNOSIS — J449 Chronic obstructive pulmonary disease, unspecified: Principal | ICD-10-CM

## 2020-07-30 IMAGING — DX DG CHEST 1V PORT
2 series · 2 of 2 positions shown · non-contrast
Comparison: Portable exam 0013 hours compared to 01/11/2016

CLINICAL DATA: Cough, COPD, HIV

EXAM:
PORTABLE CHEST 1 VIEW

[chest ap (1 of 2)]
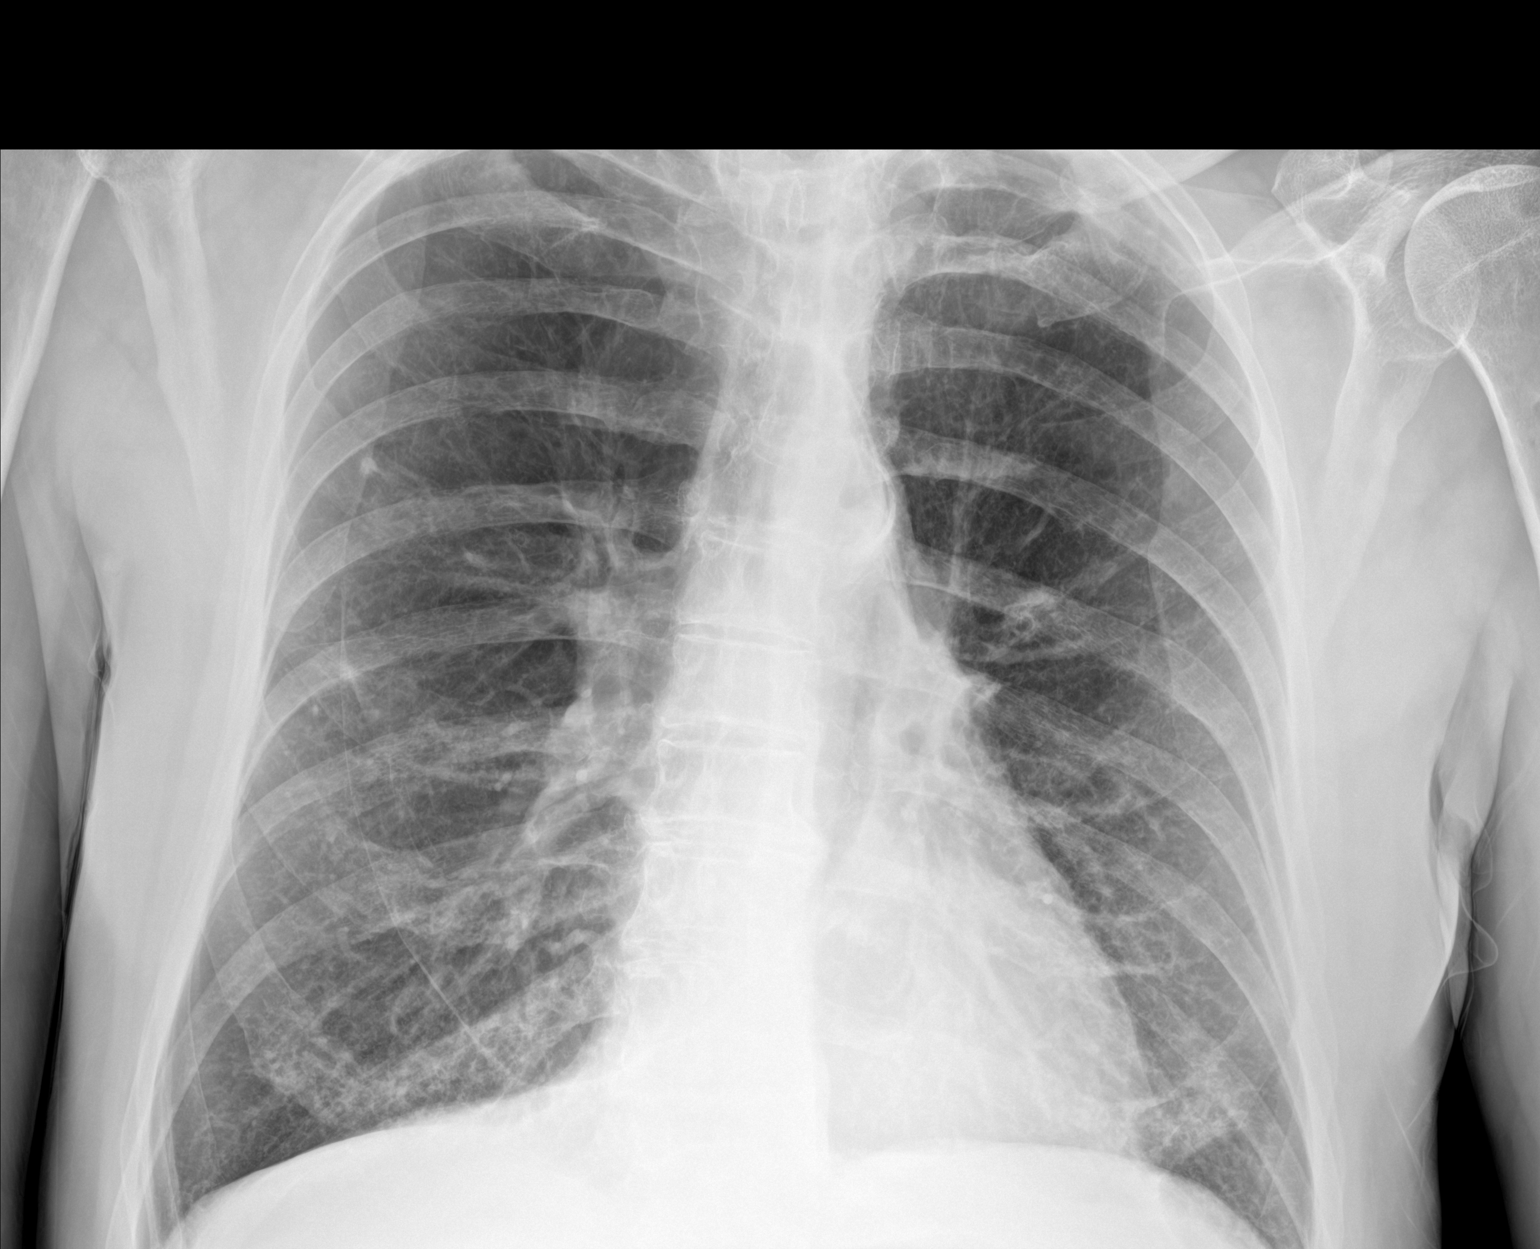

[chest ap (2 of 2)]
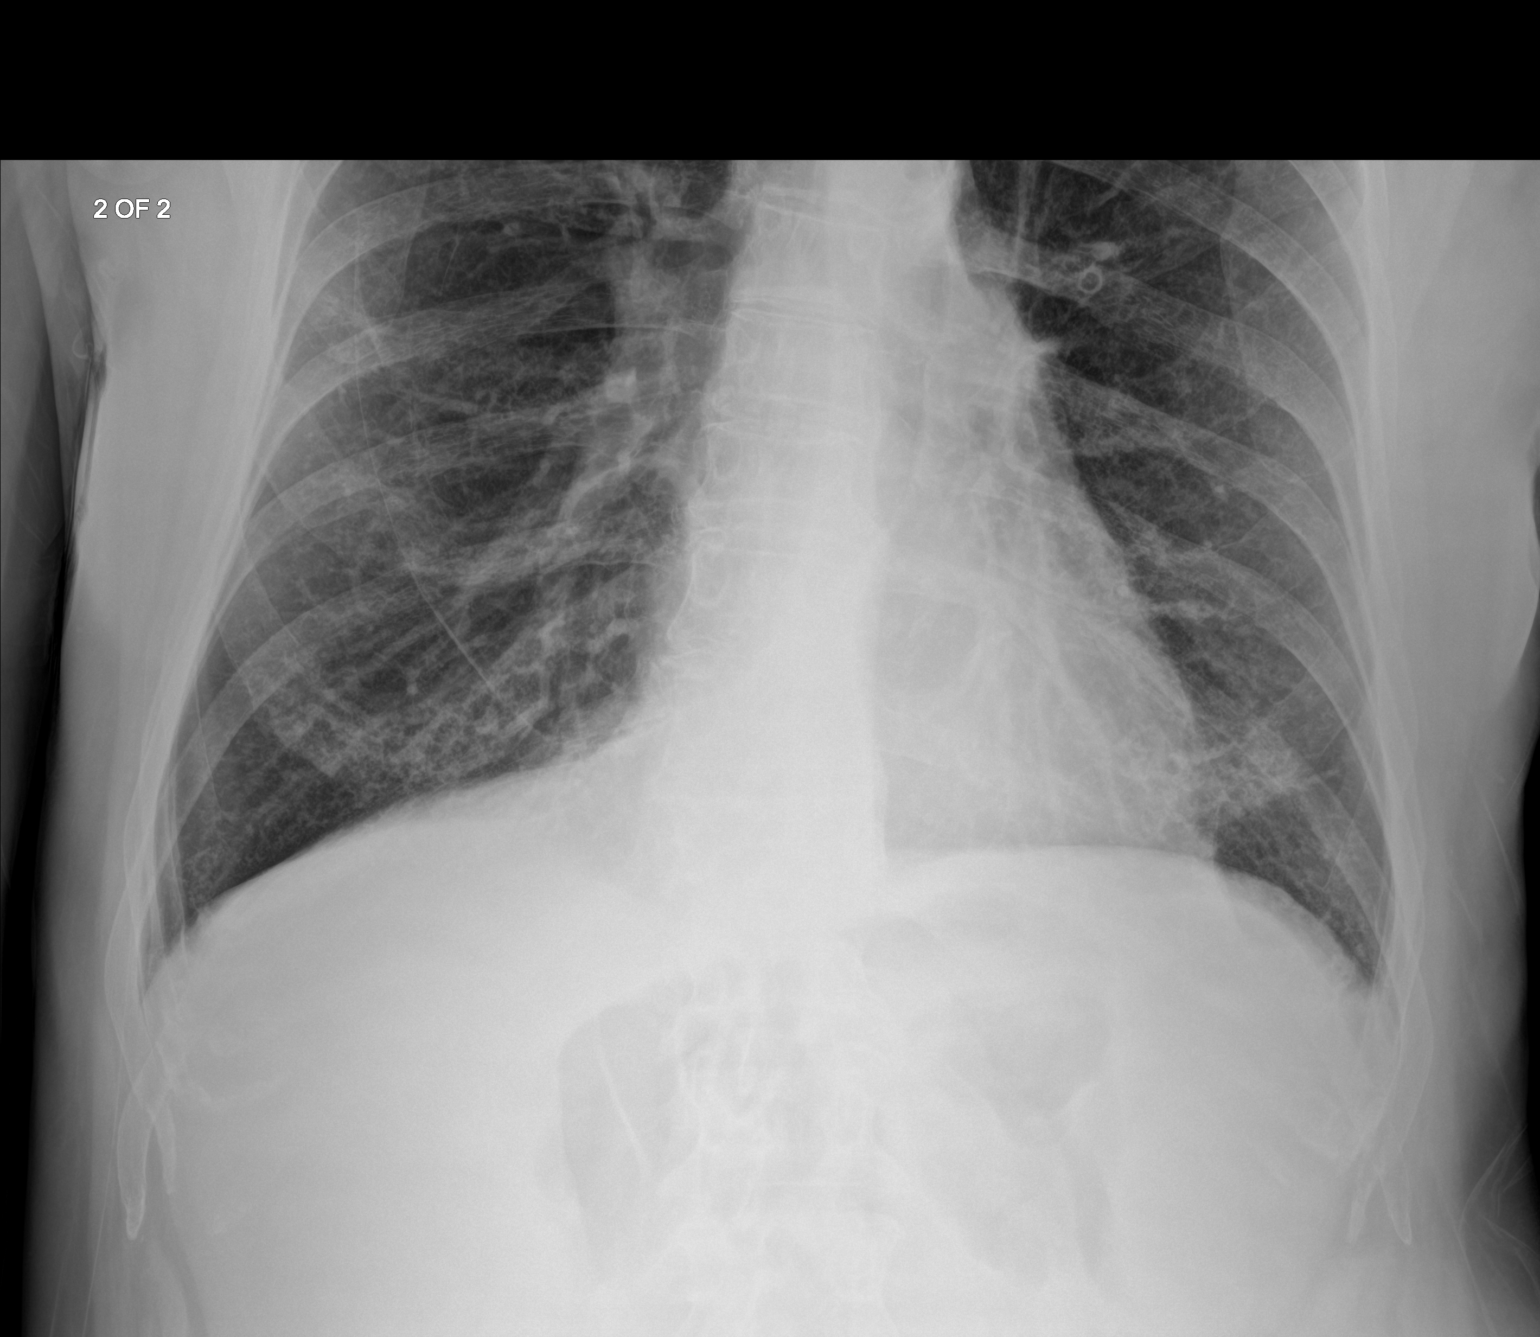

[2 of 2 positions shown; findings below may reference images not displayed]

FINDINGS: Normal heart size, mediastinal contours, and pulmonary vascularity.

Atherosclerotic calcification aorta.

Emphysematous and bronchitic changes consistent with COPD.

Scarring RIGHT base.

No definite infiltrate, pleural effusion or pneumothorax.

Calcified granuloma RIGHT lung.

Question vague nodular density 10 mm LEFT upper lobe on one view is
not seen on second view likely a summation artifact.
IMPRESSION: COPD changes without acute infiltrate.

## 2020-08-19 DIAGNOSIS — J449 Chronic obstructive pulmonary disease, unspecified: Principal | ICD-10-CM

## 2020-08-27 MED ORDER — STIOLTO RESPIMAT 2.5 MCG-2.5 MCG/ACTUATION SOLUTION FOR INHALATION
Freq: Every day | RESPIRATORY_TRACT | 11 refills | 14.00000 days | Status: CP
Start: 2020-08-27 — End: ?

## 2020-10-08 MED ORDER — LOSARTAN 25 MG TABLET
ORAL_TABLET | 3 refills | 0 days | Status: CP
Start: 2020-10-08 — End: ?

## 2020-10-26 ENCOUNTER — Ambulatory Visit: Admit: 2020-10-26 | Discharge: 2020-10-26 | Payer: MEDICARE

## 2020-10-26 DIAGNOSIS — Z129 Encounter for screening for malignant neoplasm, site unspecified: Principal | ICD-10-CM

## 2020-10-26 DIAGNOSIS — Z21 Asymptomatic human immunodeficiency virus [HIV] infection status: Principal | ICD-10-CM

## 2020-10-26 DIAGNOSIS — J449 Chronic obstructive pulmonary disease, unspecified: Principal | ICD-10-CM

## 2020-10-26 DIAGNOSIS — Z720 Tobacco use: Principal | ICD-10-CM

## 2020-10-26 DIAGNOSIS — E7800 Pure hypercholesterolemia, unspecified: Principal | ICD-10-CM

## 2020-10-26 DIAGNOSIS — Z79899 Other long term (current) drug therapy: Principal | ICD-10-CM

## 2020-10-26 DIAGNOSIS — N189 Chronic kidney disease, unspecified: Principal | ICD-10-CM

## 2020-10-26 DIAGNOSIS — J42 Unspecified chronic bronchitis: Secondary | ICD-10-CM | POA: Diagnosis not present

## 2020-10-26 DIAGNOSIS — Z882 Allergy status to sulfonamides status: Secondary | ICD-10-CM | POA: Diagnosis not present

## 2020-10-26 DIAGNOSIS — F17210 Nicotine dependence, cigarettes, uncomplicated: Secondary | ICD-10-CM | POA: Diagnosis not present

## 2020-10-26 DIAGNOSIS — Z6825 Body mass index (BMI) 25.0-25.9, adult: Secondary | ICD-10-CM | POA: Diagnosis not present

## 2020-12-15 ENCOUNTER — Ambulatory Visit: Admit: 2020-12-15 | Discharge: 2020-12-16 | Payer: MEDICARE

## 2020-12-15 DIAGNOSIS — F17210 Nicotine dependence, cigarettes, uncomplicated: Secondary | ICD-10-CM | POA: Diagnosis not present

## 2020-12-15 DIAGNOSIS — Z122 Encounter for screening for malignant neoplasm of respiratory organs: Secondary | ICD-10-CM | POA: Diagnosis not present

## 2020-12-15 DIAGNOSIS — J439 Emphysema, unspecified: Secondary | ICD-10-CM | POA: Diagnosis not present

## 2020-12-15 DIAGNOSIS — Z87891 Personal history of nicotine dependence: Secondary | ICD-10-CM | POA: Diagnosis not present

## 2020-12-15 DIAGNOSIS — R918 Other nonspecific abnormal finding of lung field: Secondary | ICD-10-CM | POA: Diagnosis not present

## 2020-12-15 DIAGNOSIS — J322 Chronic ethmoidal sinusitis: Secondary | ICD-10-CM | POA: Diagnosis not present

## 2020-12-15 DIAGNOSIS — J310 Chronic rhinitis: Secondary | ICD-10-CM | POA: Diagnosis not present

## 2021-01-18 DIAGNOSIS — Z20822 Contact with and (suspected) exposure to covid-19: Secondary | ICD-10-CM | POA: Diagnosis not present

## 2021-01-20 ENCOUNTER — Ambulatory Visit: Admit: 2021-01-20 | Discharge: 2021-01-21 | Payer: MEDICARE

## 2021-01-20 ENCOUNTER — Ambulatory Visit: Admit: 2021-01-20 | Discharge: 2021-01-21 | Payer: MEDICARE | Attending: Family | Primary: Family

## 2021-01-20 DIAGNOSIS — J069 Acute upper respiratory infection, unspecified: Principal | ICD-10-CM

## 2021-01-20 DIAGNOSIS — Z113 Encounter for screening for infections with a predominantly sexual mode of transmission: Secondary | ICD-10-CM | POA: Diagnosis not present

## 2021-01-20 DIAGNOSIS — Z6824 Body mass index (BMI) 24.0-24.9, adult: Secondary | ICD-10-CM | POA: Diagnosis not present

## 2021-01-20 DIAGNOSIS — Z882 Allergy status to sulfonamides status: Secondary | ICD-10-CM | POA: Diagnosis not present

## 2021-01-20 DIAGNOSIS — R6883 Chills (without fever): Secondary | ICD-10-CM | POA: Diagnosis not present

## 2021-01-20 DIAGNOSIS — E7800 Pure hypercholesterolemia, unspecified: Secondary | ICD-10-CM | POA: Diagnosis not present

## 2021-01-20 DIAGNOSIS — R059 Cough, unspecified: Secondary | ICD-10-CM | POA: Diagnosis not present

## 2021-01-20 DIAGNOSIS — B20 Human immunodeficiency virus [HIV] disease: Secondary | ICD-10-CM | POA: Diagnosis not present

## 2021-01-20 DIAGNOSIS — Z20822 Contact with and (suspected) exposure to covid-19: Secondary | ICD-10-CM | POA: Diagnosis not present

## 2021-01-20 DIAGNOSIS — F17210 Nicotine dependence, cigarettes, uncomplicated: Secondary | ICD-10-CM | POA: Diagnosis not present

## 2021-01-20 DIAGNOSIS — N189 Chronic kidney disease, unspecified: Secondary | ICD-10-CM | POA: Diagnosis not present

## 2021-01-20 DIAGNOSIS — J439 Emphysema, unspecified: Secondary | ICD-10-CM | POA: Diagnosis not present

## 2021-01-20 DIAGNOSIS — B1920 Unspecified viral hepatitis C without hepatic coma: Secondary | ICD-10-CM | POA: Diagnosis not present

## 2021-01-20 DIAGNOSIS — R1110 Vomiting, unspecified: Secondary | ICD-10-CM | POA: Diagnosis not present

## 2021-01-20 DIAGNOSIS — B370 Candidal stomatitis: Secondary | ICD-10-CM | POA: Diagnosis not present

## 2021-01-20 NOTE — Unmapped (Signed)
Referral Services Note     Duration of Intervention: 10 minutes    REASON/TYPE OF CONTACT: Phone    ASSESSMENT: Pt referred by provider Varney Daily for grief and depression.    INTERVENTION:  SW intern met w/ pt. Pt reported that several close people in his life had passed away in the last few years and had lost most of his close friends. States that he still has roommate and several friends but nobody that he feels very close to. Wants to connect with people but states he is not very outgoing and struggles to do so.    Pt expressed interest in virtual support group. SW intern sent him consent via MyChart -- after pt consents, will send him link to join support group. In addition, SW intern offered to put him on list of pts interested in grief support group - pt accepted. Added to interest list. SW intern also offered to link pt to individual counselor. Pt wants to think about it -- does not want to be connected at this time.    PLAN: If pt consents to support group, send link.  Pt to reach out if he wants to be connected to individual counselor.    Roswell Nickel, MSW Intern  Phone: [phone]

## 2021-01-20 NOTE — Unmapped (Unsigned)
Assessment/Plan:      Andrew Oconnell, a 65 y.o. male seen today for urgent evaluation of upper respiratory symptoms.    Plan:    Upper respiratory infection symptoms - acute  Sore throat, cough, chills, night sweats, decreased appetite, has really dry mouth x 4 weeks.  Has been off ART for about a month  CoVid test through [facility] on 01/18/21 (saw results on patient's phone) negative   ?? Obtain respiratory pathogen panel, strep test  ?? Obtain chest x-ray today      HIV - chronic  Engaged in care with Dr. Joan Flores ART via Medicare.   Lab Results   Component Value Date    ACD4 168 (L) 01/20/2021    CD4 21 (L) 01/20/2021    HIVCP <40 copies/mL 03/25/2020    HIVRS Not Detected 10/22/2019     ?? Restart Descovy and Tivicay. Has been off ART due to inactivation of copay cards secondary to insurance change. Then did not reach out to get help with re-accessing meds. Sent RX to [facility].  ?? Meeting with Benefits today.  ?? Checking CD4, HIV RNA, & safety labs (full return).  ?? Discussed specific strategies to improve ARV adherence.  UPDATE: CD4 count 168 on 01/20/21. Patient will need to start OI prophylaxis. Patient has a mild allergy to Bactrim (rash) and has been on Dapsone for some time in the past without adverse effects. Adding on G6PD to labs from yesterday, though looks like this was checked in April 12, 2002 as well, but I cannot locate results. RX for Dapsone 100mg  PO daily sent to [facility] as well.      Mental Health  Recently feeling lonely, he's not sure this has to do with the winter as well.  His sister passed away in 04-12-2016, then her husband passed 12-Apr-2018, and recently he has some close people to him pass away as well. And I'm still here.  He believes this contributed to him not trying to restart ART.  ?? Introduced idea of HIV support group currently being lead by [employer], LCSW. It is virtual and may be helpful in keeping him connected to a community.  ?? He is not as open to participating at local senior center.  ?? Has a male roommate who he's known since high school. He likes having her there but she is also going through her own personal issues.      Sexual health & secondary prevention  Has not had sexual contact for some time.  Lab Results   Component Value Date    RPR Nonreactive 10/22/2019     ?? GC/CT NAATs -- obtained today  ?? RPR -- for screening obtained today    Immunization History   Administered Date(s) Administered   ??? COVID-19 VACCINE,MRNA(MODERNA)(PF)(IM) 12/12/2019, 01/07/2020, 09/03/2020   ??? Hepatitis B, Adult 04/17/2012, 07/17/2012, 01/29/2013   ??? INFLUENZA TIV (TRI) PF (IM) 08/21/2007, 07/29/2008, 09/29/2009, 07/06/2010, 10/04/2011, 07/17/2012, 08/20/2013   ??? Influenza Vaccine Quad (IIV4 PF) 41mo+ injectable 07/29/2014, 08/20/2015, 06/28/2016, 08/02/2017, 07/31/2018, 10/22/2019, 07/06/2020   ??? Influenza Virus Vaccine, unspecified formulation 07/29/2014, 08/20/2015, 06/28/2016, 08/02/2017   ??? Novel Influenza-h1n1-09, All Formulations 11/04/2008   ??? PNEUMOCOCCAL POLYSACCHARIDE 23 11/25/2003, 02/17/2009, 01/12/2016   ??? Pneumococcal Conjugate 13-Valent 10/23/2012   ??? TdaP 02/22/2006, 06/19/2013     ?? Immunizations ordered today: none today    Counseling services took more than 50% of today's visit time.  Counseled today regarding ART restart, symptoms.    Disposition  Return to clinic 01/31/21 with Dr. Althea Charon for close follow up or sooner if needed.    Varney Daily, FNP-BC  [facility]  [address] [place], [place] [address]    Phone: [phone]   Fax: [phone]          Subjective:      Chief Complaint   Urgent visit for upper respiratory infection.    HPI in addition to A/P:  Urgent visit for Andrew Oconnell, a 65 y.o. male   ?? Has been depressed  ?? Over the last month, sore throat, cough, thought it might have been his esophageal symptoms similar to what he had years ago. Intermittently, has sweats, chills, no energy, has not had fever, sputum is clear discharge/[place], no blood.  ?? Decreased appetitie over last month, food is not good      Past Medical History:   Diagnosis Date   ??? Chronic renal insufficiency 06/12/2013   ??? COPD (chronic obstructive pulmonary disease) (CMS-HCC) 01/29/2013   ??? Emphysema of lung (CMS-HCC)    ??? Hepatitis C    ??? Human immunodeficiency virus (HIV) disease (CMS-HCC) 01/29/2013   ??? Hypercholesteremia 01/29/2013   ??? Reflux    ??? Vomiting     after eating       Medications and Allergies   Reviewed and updated today. See bottom of this visit's encounter summary for details.  Current Outpatient Medications on File Prior to Visit   Medication Sig   ??? albuterol HFA 90 mcg/actuation inhaler Inhale 1 puff every six (6) hours as needed.   ??? atorvastatin (LIPITOR) 10 MG tablet TAKE 1 TABLET BY MOUTH  DAILY   ??? dolutegravir (TIVICAY) 50 mg TABLET Take 1 tablet (50 mg total) by mouth once daily.   ??? emtricitabine-tenofovir alafen (DESCOVY) 200-25 mg tablet Take 1 tablet by mouth daily.   ??? lamoTRIgine (LAMICTAL) 200 MG tablet TAKE 1 TABLET BY MOUTH  DAILY   ??? losartan (COZAAR) 25 MG tablet TAKE 1 TABLET BY MOUTH  DAILY   ??? naproxen sodium (ALEVE) 220 MG tablet Take 220 mg by mouth daily as needed for pain.   ??? tiotropium-olodateroL (STIOLTO RESPIMAT) 2.5-2.5 mcg/actuation Mist Inhale 2 puffs daily.   ??? traZODone (DESYREL) 150 MG tablet TAKE 1 TABLET BY MOUTH AT  NIGHT   ??? fluticasone (FLONASE) 50 mcg/actuation nasal spray 1 spray by Each Nare route daily.   ??? TRIAMCINOLONE ACETONIDE (TRIAMCINOLONE 0.05 % IN MODIFIED EUCERIN) Crea Apply 1 application topically Two (2) times a day.     No current facility-administered medications on file prior to visit.       Allergies   Allergen Reactions   ??? Pollen Extracts      Congestion     ??? Sulfa (Sulfonamide Antibiotics) Rash       Social History  Social History     Tobacco Use   ??? Smoking status: Current Every Day Smoker     Packs/day: 0.50     Years: 43.00     Pack years: 21.50     Types: Cigarettes     Start date: 01/29/1970   ??? Smokeless tobacco: Never Used   ??? Tobacco comment: 10 cigarettes/day   Substance Use Topics   ??? Alcohol use: Yes     Alcohol/week: 0.0 standard drinks     Comment: sunday ~ 2 mixed drinks (vodka)        Review of Systems  As per HPI. Remainder of 10 systems  reviewed, negative.        Objective:      BP 123/84 (BP Site: L Arm)  - Pulse 113  - Temp 37 ??C (98.6 ??F) (Oral)  - Wt 94.3 kg (208 lb)  - BMI 24.67 kg/m??     Const mildly ill-appearing and attentive, alert, appropriate   Eyes sclerae anicteric, noninjected OU   ENT some redness to tongue and mucosa but no thrush patches noted in mouth or in throat   Lymph no cervical or supraclavicular LAD   CV RRR. No murmurs. No rub or gallop. S1/S2.   Resp CTAB ant/post, normal work of breathing   GI Soft. NTND. NABS.   GU deferred   Rectal deferred   Skin no petechiae, ecchymoses or obvious rashes on clothed exam   MSK normal ROM throughout   Neuro grossly intact   Psych Appropriate affect. Eye contact good. Linear thoughts. Fluent speech.     Laboratory Data  Reviewed in Epic today, using Synopsis and Chart Review filters.    Lab Results   Component Value Date    CREATININE 0.90 01/20/2021    HEPCAB Positive (A) 01/10/2015    HCVRNA Not Detected 07/31/2018    HCVRNAIU 16 03/19/2015    HCVIU [id number] 07/29/2014    CHOL 123 03/25/2020    HDL 66 03/25/2020    LDL 41 03/25/2020    NONHDL 103 10/04/2011    TRIG 79 03/25/2020    PSA 0.16 05/02/2017    A1C 5.0 03/25/2020    FINALDX  10/12/2018     A: Stomach, biopsy  - Gastric fundic and antral mucosa with chronic superficial gastritis  - No Helicobacter pylori identified on H&E stain    This electronic signature is attestation that the pathologist personally reviewed the submitted material(s) and the final diagnosis reflects that evaluation. that evaluation.                  _____________________________________________________________________

## 2021-01-21 LAB — HIV RNA, QUANTITATIVE, PCR
HIV RNA LOG(10): 4.79 {Log_copies}/mL — ABNORMAL HIGH (ref <0.00)
HIV RNA QNT RSLT: DETECTED — AB
HIV RNA: 61184 {copies}/mL — ABNORMAL HIGH (ref <0)

## 2021-01-21 LAB — SYPHILIS SCREEN: SYPHILIS RPR SCREEN: NONREACTIVE

## 2021-01-21 MED ORDER — DESCOVY 200 MG-25 MG TABLET
ORAL_TABLET | Freq: Every day | ORAL | 5 refills | 30.00000 days | Status: CP
Start: 2021-01-21 — End: ?
  Filled 2021-01-21: qty 30, 30d supply, fill #0

## 2021-01-21 MED ORDER — DAPSONE 100 MG TABLET
ORAL_TABLET | Freq: Every day | ORAL | 2 refills | 30.00000 days | Status: CP
Start: 2021-01-21 — End: 2021-04-21
  Filled 2021-01-21: qty 30, 30d supply, fill #0

## 2021-01-21 MED ORDER — TIVICAY 50 MG TABLET
ORAL_TABLET | Freq: Every day | ORAL | 5 refills | 30 days | Status: CP
Start: 2021-01-21 — End: ?
  Filled 2021-01-21: qty 30, 30d supply, fill #0

## 2021-01-21 NOTE — Unmapped (Signed)
[facility] Specialty Medication Onboarding    Specialty Medication: Descovy  Prior Authorization: Not Required   Financial Assistance: SPAP is pending per clinic  Final Copay/Day Supply: $549.52 / 30    Insurance Restrictions: None     [facility] Specialty Medication Onboarding    Specialty Medication: Tivicay  Prior Authorization: Not Required   Financial Assistance: SPAP is pending per clinic  Final Copay/Day Supply: $680.57 / 30    Insurance Restrictions: None     Notes to Pharmacist: dapsone $6    The triage team has completed the benefits investigation and has determined that the patient is able to fill this medication at [facility]. Please contact the patient to complete the onboarding or follow up with the prescribing physician as needed.

## 2021-01-21 NOTE — Unmapped (Signed)
[facility]   Patient Onboarding/Medication Counseling    Mr.Andrew Oconnell is a 65 y.o. male with HIV who I am counseling today on continuation of therapy.  I am speaking to the patient.    Was a Nurse, learning disability used for this call? No    Verified patient's date of birth / HIPAA.    Specialty medication(s) to be sent: Infectious Disease: Descovy and Tivicay      Non-specialty medications/supplies to be sent: dapsone      Medications not needed at this time: n/a         Tivicay (dolutegravir)    The patient declined counseling on medication administration, missed dose instructions, goals of therapy, side effects and monitoring parameters, warnings and precautions, drug/food interactions and storage, handling precautions, and disposal because they have taken the medication previously. The information in the declined sections below are for informational purposes only and was not discussed with patient.       Medication & Administration     Dosage: Take one tablet (50mg ) by mouth once daily    Administration: Take with or without food    Adherence/Missed dose instructions: take missed dose as soon as you remember. If it is close to the time of your next dose, skip the dose and resume with your next scheduled dose.    Goals of Therapy     to keep HIV levels at a non-detectable level on lab tests.    Side Effects & Monitoring Parameters     Common Side Effects:   ??? Headache  ??? Feeling tired or weak  ??? Trouble sleeping      The following side effects should be reported to the provider:  ??? Signs of an allergic reaction, like rash; hives; itching; red, swollen, blistered, or peeling skin with or without fever; wheezing; tightness in the chest or throat; trouble breathing, swallowing, or talking; unusual hoarseness; or swelling of the mouth, face, lips, tongue, or throat  ??? Signs of liver problems like dark urine, feeling tired, not hungry, upset stomach or stomach pain, light-colored stools, throwing up, or yellow skin or eyes  ??? Fever  ??? Muscle or joint pain  ??? Mouth sores  ??? Eye irritation.   ??? Shortness of breath  ??? Feeling very tired or weak  ??? Signs of infection like fever, sore throat, weakness, cough, or shortness of breath.      Contraindications, Warnings, & Precautions   ??? Hepatotoxicity  ??? Hypersensitivity reactions: Rash, constitutional findings, and organ dysfunction (eg, liver injury) have been reported.  ??? Immune reconstitution syndrome: Patients may develop immune reconstitution syndrome resulting in the occurrence of an inflammatory response to an indolent or residual opportunistic infection during initial HIV treatment or activation of autoimmune disorders (eg, Graves disease, polymyositis, Guillain-Barr?? syndrome) later in therapy  ??? Use caution in patients with renal or hepatic impairment    Drug/Food Interactions   ??? Medication list reviewed in Epic. The patient was instructed to inform the care team before taking any new medications or supplements. No drug interactions identified.   ??? Tivicay should be taken 2 hours before or 6 hours after taking cation-containing antacids or laxatives, sucralfate, oral supplements containing iron or calcium, or buffered medications.  ??? Tivicay and supplements containing calcium or iron can be taken together with food.    Storage, Handling Precautions, & Disposal     For the 10 mg tablet: Store in the original container. Do not take out the antimoisture cube or  packet.    For all products:   ??? Store at room temperature in a dry place. Do not store in a bathroom.  ??? Keep lid tightly closed.  ??? Keep all drugs in a safe place  ??? Keep all drugs out of the reach of children and pets.   ??? Throw away unused or expired drugs. Do not flush down a toilet or pour down a drain unless you are told to do so. Check with your pharmacist if you have questions about the best way to throw out drugs. There may be drug take-back programs in your area.      Descovy (emtricitabine and tenofovir alafenamide)    The patient declined counseling on medication administration, missed dose instructions, goals of therapy, side effects and monitoring parameters, warnings and precautions, drug/food interactions and storage, handling precautions, and disposal because they have taken the medication previously. The information in the declined sections below are for informational purposes only and was not discussed with patient.       Medication & Administration     Dosage: Take 1 tablet by mouth daily    Administration: Take with or without food    Adherence/Missed dose instructions: take missed dose as soon as you remember. If it is close to the time of your next dose, skip the dose and resume with your next scheduled dose.    Goals of Therapy     Keep HIV levels non-detectable on lab tests    Side Effects & Monitoring Parameters   Common Side Effects:  ??? Upset stomach  ??? Diarrhea    The following side effects should be reported to the provider:  ?? Signs of an allergic reaction, like rash; hives; itching; red, swollen, blistered, or peeling skin with or without fever; wheezing; tightness in the chest or throat; trouble breathing, swallowing, or talking; unusual hoarseness; or swelling of the mouth, face, lips, tongue, or throat.   ?? Signs of kidney problems like unable to pass urine, change in how much urine is passed, blood in the urine, or a big weight gain.  ?? Signs of liver problems like dark urine, feeling tired, not hungry, upset stomach or stomach pain, light-colored stools, throwing up, or yellow skin or eyes.   ?? Signs of too much lactic acid in the blood (lactic acidosis) like fast breathing, fast heartbeat, a heartbeat that does not feel normal, very bad upset stomach or throwing up, feeling very sleepy, shortness of breath, feeling very tired or weak, very bad dizziness, feeling cold, or muscle pain or cramps  ?? Weight gain    Monitoring Parameters:   - Serum creatinine  - Urine glucose  - Urine protein (prior to or when initiating therapy and as clinically indicated during therapy);  - Serum phosphorus (in patients with chronic kidney disease)  - Hepatic function tests  - Testing for hepatitis B virus (HBV) is recommended prior to or when initiating antiretroviral therapy.  - Patients with HIV and HBV coinfection should be monitored for several months following therapy discontinuation.  - CD4 count  - HIV RNA plasma levels     Contraindications, Warnings, & Precautions     ?? Signs and symptoms of immune reconstitution syndrome  ?? Signs and symptoms of lactic acidosis  ?? Hepatomegaly  ?? Steatosis  ?? Renal toxicity    Drug/Food Interactions     ??? Medication list reviewed in Epic. The patient was instructed to inform the care team before taking any new medications or  supplements. No drug interactions identified.     Storage, Handling Precautions, & Disposal     ?? Store this medication at room temperature.   ?? Store in the original container   ?? Keep lid tightly closed.   ?? Store in a dry place. Do not store in a bathroom.   ?? Keep all drugs in a safe place. Keep all drugs out of the reach of children and pets.   ?? Throw away unused or expired drugs. Do not flush down a toilet or pour down a drain unless you are told to do so. Check with your pharmacist if you have questions about the best way to throw out drugs. There may be drug take-back programs in your area        Current Medications (including OTC/herbals), Comorbidities and Allergies     Current Outpatient Medications   Medication Sig Dispense Refill   ??? albuterol HFA 90 mcg/actuation inhaler Inhale 1 puff every six (6) hours as needed. 8 g 3   ??? atorvastatin (LIPITOR) 10 MG tablet TAKE 1 TABLET BY MOUTH  DAILY 90 tablet 3   ??? dapsone 100 MG tablet Take 1 tablet (100 mg total) by mouth daily. 30 tablet 2   ??? dolutegravir (TIVICAY) 50 mg TABLET Take 1 tablet (50 mg total) by mouth daily. 30 tablet 5   ??? emtricitabine-tenofovir alafen (DESCOVY) 200-25 mg tablet Take 1 tablet by mouth daily. 30 tablet 5   ??? fluticasone (FLONASE) 50 mcg/actuation nasal spray 1 spray by Each Nare route daily. 16 g 0   ??? lamoTRIgine (LAMICTAL) 200 MG tablet TAKE 1 TABLET BY MOUTH  DAILY 90 tablet 3   ??? losartan (COZAAR) 25 MG tablet TAKE 1 TABLET BY MOUTH  DAILY 90 tablet 3   ??? naproxen sodium (ALEVE) 220 MG tablet Take 220 mg by mouth daily as needed for pain.     ??? tiotropium-olodateroL (STIOLTO RESPIMAT) 2.5-2.5 mcg/actuation Mist Inhale 2 puffs daily. 4 g 11   ??? traZODone (DESYREL) 150 MG tablet TAKE 1 TABLET BY MOUTH AT  NIGHT 30 tablet 11     No current facility-administered medications for this visit.       Allergies   Allergen Reactions   ??? Pollen Extracts      Congestion     ??? Sulfa (Sulfonamide Antibiotics) Rash       Patient Active Problem List   Diagnosis   ??? Bipolar affective disorder (CMS-HCC)   ??? Chronic hepatitis C (CMS-HCC)   ??? Panuveitis   ??? Refractive amblyopia   ??? Human immunodeficiency virus (HIV) disease (CMS-HCC)   ??? COPD (chronic obstructive pulmonary disease) (CMS-HCC)   ??? Hypercholesteremia   ??? Chronic renal insufficiency   ??? Cancer screening   ??? Tobacco abuse   ??? Dry skin   ??? Dysphagia   ??? Aortic root dilation (CMS-HCC)   ??? Left shoulder pain   ??? Insomnia   ??? 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 Benign prostatic hyperplasia with lower urinary tract symptoms   ??? Acute bronchitis       Reviewed and up to date in Epic.    Appropriateness of Therapy     Acute infections noted within Epic:  No active infections  Patient reported infection: None    Are the medications and dosages appropriate based on diagnosis and infection status? Yes    Prescription has been clinically reviewed: Yes      Baseline Quality of Life  Assessment      How many days over the past month did your HIV  keep you from your normal activities? For example, brushing your teeth or getting up in the morning. 0    Financial Information     Medication Assistance provided: None Required    Anticipated copay of $549.52 / 30 days for Descovy and $680.57 / 30 days for Tivicay  reviewed with patient.  He has agreed to pay at [employer] $10.00 per month to keep his account open.    Verified delivery address.    Delivery Information     Scheduled delivery date: 01/22/21    Expected start date: continuation of existing therapy    Medication will be delivered via UPS to the prescription address in [facility].  This shipment will not require a signature.      Explained the services we provide at [facility] and that each month we would call to set up refills.  Stressed importance of returning phone calls so that we could ensure they receive their medications in time each month.  Informed patient that we should be setting up refills 7-10 days prior to when they will run out of medication.  A pharmacist will reach out to perform a clinical assessment periodically.  Informed patient that a welcome packet, containing information about our pharmacy and other support services, a Notice of Privacy Practices, and a drug information handout will be sent.      Patient verbalized understanding of the above information as well as how to contact the pharmacy at [phone] option 4 with any questions/concerns.  The pharmacy is open Monday through Friday 8:30am-4:30pm.  A pharmacist is available 24/7 via pager to answer any clinical questions they may have.    Patient Specific Needs     - Does the patient have any physical, cognitive, or cultural barriers? No    - Patient prefers to have medications discussed with  Patient     - Is the patient or caregiver able to read and understand education materials at a high school level or above? Yes    - Patient's primary language is  English     - Is the patient high risk? No    - Does the patient require a Care Management Plan? No     - Does the patient require physician intervention or other additional services (i.e. nutrition, smoking cessation, social work)? No      [name]  [facility] Specialty Pharmacist

## 2021-01-22 LAB — GLUCOSE 6 PHOSPHATE DEHYDROGENASE: GLUCOSE-6-PHOSPHATE DEHYDROGENASE QUAL: 7.7 U/g{Hb} (ref 5.3–10.3)

## 2021-01-22 NOTE — Unmapped (Signed)
Patient completed HMAP & [employer] application. Eligible for RW B&C grant services and Caps on Charges. IPL= 281%, FPL= 281%. Expires 01/19/2022    RW Eligibility Form informing patient about RW services and Caps on charges was sent to patient via Mail    HMAP/SPAP application: Application was submitted via secure email today to [employer] [facility]) by Clydene Pugh    [name]  ID Clinic Benefits Counselor  Time of Intervention-49mins

## 2021-02-01 NOTE — Unmapped (Signed)
Contacted patient to inform him of his approved application. Reached out to [place] to make sure patient could continue filling at [facility]. Also informed patient to look out for his [employer] card.    [name]  ID Clinic Benefits Counselor  Time of Intervention-62mins

## 2021-02-02 ENCOUNTER — Ambulatory Visit
Admit: 2021-02-02 | Discharge: 2021-02-03 | Payer: MEDICARE | Attending: Infectious Disease | Primary: Infectious Disease

## 2021-02-02 DIAGNOSIS — B20 Human immunodeficiency virus [HIV] disease: Principal | ICD-10-CM

## 2021-02-02 DIAGNOSIS — F32A Depression, unspecified depression type: Principal | ICD-10-CM

## 2021-02-02 DIAGNOSIS — B59 Pneumocystosis: Secondary | ICD-10-CM | POA: Diagnosis not present

## 2021-02-02 DIAGNOSIS — Z6825 Body mass index (BMI) 25.0-25.9, adult: Secondary | ICD-10-CM | POA: Diagnosis not present

## 2021-02-02 MED ORDER — ATOVAQUONE 750 MG/5 ML ORAL SUSPENSION
Freq: Two times a day (BID) | ORAL | 11 refills | 30 days | Status: CP
Start: 2021-02-02 — End: 2021-02-23

## 2021-02-02 NOTE — Unmapped (Signed)
Your three most recent CD4 T-cell counts are below.   Here are a few things to keep in mind when looking at your numbers:   ??? For most people, we're checking CD4 counts once or twice a year.  ??? It's normal for your CD4 count to be different from visit to visit. Please let us know if you have any questions.      Lab Results   Component Value Date    Absolute CD4 Count 168 (L) 01/20/2021    Absolute CD4 Count 433 (A) 03/25/2020    Absolute CD4 Count 593 10/22/2019    Absolute CD4 Count 543 10/23/2018    Absolute CD4 Count 630 06/28/2016    Absolute CD4 Count 710 12/01/2015     Lab Results   Component Value Date    HIV RNA 61,184 (H) 01/20/2021    HIV RNA <40 copies/mL 03/25/2020    HIV RNA <40 11/29/2016    HIV RNA <40 12/01/2015        GENERAL INSTRUCTIONS   -- clinic phone number is [phone]  -- return to clinic in 3 months   -- if need sooner appointment, please contact clinic   -- if need urgent assistance, please call 911 or go to ED.

## 2021-02-02 NOTE — Unmapped (Signed)
Was off meds from January-April (restarted April 7th). CD4 low at 168.  Says has been taking his tivicay and descovy and dapsone since 4/7  No need to repeat labs right now  Will bring back in 3-4 weeks to assess.

## 2021-02-02 NOTE — Unmapped (Signed)
Return Patient Note    Andrew Oconnell is a 65 y.o. male (DOB: Dec 18, 1955) who is seen in follow-up     Assessment/Plan:     Human immunodeficiency virus (HIV) disease (CMS-HCC)  Was off meds from January-April (restarted April 7th). CD4 low at 168.  Says has been taking his tivicay and descovy and dapsone since 4/7  No need to repeat labs right now  Will bring back in 3-4 weeks to assess.    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  Given O2 sat is 93% and none from recent visit earlier in month, to be safe, will start atovoquone bid x 21 days to treat potential PJP. Will hold dapsone during treatment.  ALso contacted his primary pulmonologist to see if wants to see sooner than routine follow up - reassuring that recent CT scan chest for cancer in March was neg for acute illness and CXR from 4/7 was neg. Will follow up in 3-4 weeks or sooner if worsens    Depression  Patient feels pretty lonely and that all of his friends and family have died (which is basically the case).  He states he really got depressed over holidays and through this year but over easter was first time he felt some joy again and at a full meal. Denies SI/HI even passive and states is willing to start taking his meds again which was something he wasn't doing when feeling really down. Also will consider coming to support group and engaging with social work. Does contract for safety      COVID Education:  - Discussed the current COVID pandemic and strategies to avoid infection and what to do if the patient becomes symptomatic.  - Discussed use of masks, vaccines, social distancing, symptoms, etc.      Prevention Education     -Discussed U=U and Stressed importance of adherence and viral suppression       -Discussed disclosure of HIV status with sex partners, PrEP, condom use and STIs      Sexual health  See bottom of note for most recent test dates and results.  ?? GC/CT NAATs -- not needed to be checked today  ?? RPR -- not needed to be checked today  ?? Anal pap -- needed but deferred to future visit      Educational and counseling services took 20 minutes of today's visit time.    Total visit lasted 30 minutes     Follow Up:  Return to clinic in 4 weeks or sooner if needed.     Return in about 4 weeks (around 03/02/2021), or please schedule with Corazon.  Future Appointments   Date Time Provider Department Center   03/02/2021 10:30 AM Andrew Single, FNP [facility]           Subjective:      Andrew Oconnell is a 65 y.o. male (DOB: 1956/06/17)  who is seen in follow-up regarding Follow-up  .     HPI:     Patient is here for routine HIV follow-up appointment.  Today reports persistent SOB/DOE- he says it is worse when he does anything outside like arcound the grass on in pollen. Using inhalers more than directed by pulm.  Feels a little better in terms of mood than when came to walk in on 4/6  Did restart HIV meds and OI prophylaxis on 4/7    See above for full review by problem    HIV   no missed doses   no medication side effects  No intercurrent illnesses or hospitalizations.   Has not seen any other medical providers. - sans recent visit to walk in clinic       Review of Systems:     Pertinent items are noted in HPI.      Current Meds:  Current Outpatient Medications   Medication Sig Dispense Refill   ??? albuterol HFA 90 mcg/actuation inhaler Inhale 1 puff every six (6) hours as needed. 8 g 3   ??? atorvastatin (LIPITOR) 10 MG tablet TAKE 1 TABLET BY MOUTH  DAILY 90 tablet 3   ??? dolutegravir (TIVICAY) 50 mg TABLET Take 1 tablet (50 mg total) by mouth daily. 30 tablet 5   ??? emtricitabine-tenofovir alafen (DESCOVY) 200-25 mg tablet Take 1 tablet by mouth daily. 30 tablet 5   ??? lamoTRIgine (LAMICTAL) 200 MG tablet TAKE 1 TABLET BY MOUTH  DAILY 90 tablet 3   ??? losartan (COZAAR) 25 MG tablet TAKE 1 TABLET BY MOUTH  DAILY 90 tablet 3   ??? naproxen sodium (ALEVE) 220 MG tablet Take 220 mg by mouth daily as needed for pain.     ??? tiotropium-olodateroL (STIOLTO RESPIMAT) 2.5-2.5 mcg/actuation Mist Inhale 2 puffs daily. 4 g 11   ??? traZODone (DESYREL) 150 MG tablet TAKE 1 TABLET BY MOUTH AT  NIGHT 30 tablet 11   ??? atovaquone (MEPRON) 750 mg/5 mL suspension Take 5 mL (750 mg total) by mouth Two (2) times a day for 21 days. 300 mL 11   ??? fluticasone (FLONASE) 50 mcg/actuation nasal Oconnell 1 Oconnell by Each Nare route daily. 16 g 0     No current facility-administered medications for this visit.       Allergies: Pollen extracts and Sulfa (sulfonamide antibiotics)    History: I reviewed [name]'s medical and surgical history and updated as appropriate.    Past Medical History:   Diagnosis Date   ??? Chronic renal insufficiency 06/12/2013   ??? COPD (chronic obstructive pulmonary disease) (CMS-HCC) 01/29/2013   ??? Emphysema of lung (CMS-HCC)    ??? Hepatitis C    ??? Human immunodeficiency virus (HIV) disease (CMS-HCC) 01/29/2013   ??? Hypercholesteremia 01/29/2013   ??? Reflux    ??? Vomiting     after eating       Past Surgical History:   Procedure Laterality Date   ??? BACK SURGERY N/A 1990   ??? LIGATION / DIVISION SAPHENOUS VEIN Bilateral     90s   ??? PR COLSC FLX W/RMVL OF TUMOR POLYP LESION SNARE TQ N/A 05/08/2015    Procedure: COLONOSCOPY FLEX; W/REMOV TUMOR/LES BY SNARE;  Surgeon: Andrew Spray, MD;  [facility];  Service: Gastroenterology   ??? PR ESOPHAGEAL MOTILITY STUDY, MANOMETRY N/A 06/04/2014    Procedure: ESOPHAGEAL MOTILITY STUDY W/INT & REP;  Surgeon: Nurse-Based Giproc;  Location: [facility];  Service: Gastroenterology   ??? PR REPAIR BICEPS LONG TENDON Left 03/11/2016    Procedure: TENODESIS LONG TENDON BICEPS;  Surgeon: Andrew Rand, MD;  Location: ASC OR [facility];  Service: Orthopedics   ??? PR SHLDR ARTHROSCOP,SURG,W/ROTAT CUFF REPR Left 03/11/2016    Procedure: ARTHROSCOPY, SHOULDER, SURGICAL; WITH ROTATOR CUFF REPAIR;  Surgeon: Andrew Rand, MD;  Location: ASC OR [facility];  Service: Orthopedics   ??? PR SURGICAL ARTHROSCOPY SHOULDER  XTNSV DBRDMT 3+ Left 03/11/2016    Procedure: ARTHROSCOPY SHOULDER SURG; DEBRID EXTEN;  Surgeon: Andrew Rand, MD;  Location: ASC OR [facility];  Service: Orthopedics   ??? PR UPPER GI ENDOSCOPY,BIOPSY N/A 05/20/2014    Procedure: UGI ENDOSCOPY; WITH BIOPSY, Oconnell OR MULTIPLE;  Surgeon: Donneta Romberg, MD;  Location: [facility];  Service: Gastroenterology   ??? PR UPPER GI ENDOSCOPY,BIOPSY N/A 10/12/2018    Procedure: UGI ENDOSCOPY; WITH BIOPSY, Oconnell OR MULTIPLE;  Surgeon: Janyth Pupa, MD;  Location: [facility];  Service: Gastroenterology   ??? PR UPPER GI ENDOSCOPY,DIAGNOSIS N/A 11/06/2015    Procedure: UGI ENDO, INCLUDE ESOPHAGUS, STOMACH, & DUODENUM &/OR JEJUNUM; DX W/WO COLLECTION SPECIMN, BY BRUSH OR WASH;  Surgeon: Rona Ravens, MD;  Location: [facility];  Service: Gastroenterology   ??? PR UPPER GI ENDOSCOPY,DIAGNOSIS N/A 01/31/2020    Procedure: UGI ENDO, INCLUDE ESOPHAGUS, STOMACH, & DUODENUM &/OR JEJUNUM; DX W/WO COLLECTION SPECIMN, BY BRUSH OR WASH;  Surgeon: Annie Paras, MD;  Location: [facility];  Service: Gastroenterology       Family History:  I have reviewed past family history with no new findings since the last clinic appointment dated    Family History   Adopted: Yes   Problem Relation Age of Onset   ??? Hepatitis Sister    ??? Rheumatologic disease Sister    ??? Parkinsonism Sister    ??? Melanoma Neg Hx    ??? Basal cell carcinoma Neg Hx    ??? Squamous cell carcinoma Neg Hx        Social History  General     Living situation      In school      Employment           Sexual      Relationship      Number partners      Oral sex   .     Anal sex   .     Vaginal sex   .          Substance Use     Alcohol      Tobacco      Marijuana      Illicits   .     IDU Objective:      Physical Exam:    BP 124/71  - Pulse 99  - Temp 36.8 ??C (98.2 ??F) (Oral)  - Ht 195.6 cm (6' 5)  - Wt 95.8 kg (211 lb 3.2 oz)  - SpO2 93%  - BMI 25.04 kg/m??     GEN:  looks well, no apparent distress  HEENT: PERRL, EOMI, no thrush, lesions or exudate  1cm post auricular LN on Right (also felt by Varney Daily today_)  CV:RRR, no m/r/g, S1/S2  PULM:CTAB ant/post, normal work of breathing  ZOX:WRUE, NTND, no masses  AV:[id number]  RECTAL:deferred  SKIN:no petechiae, ecchymoses or obvious rashes on clothed exam  NEURO:grossly nonfocal  PSYCH:attentive, appropriate affect, good eye contact, fluent speech    Laboratory:    I have reviewed all recent lab results including:    Results for orders placed or performed in visit on 01/20/21   Chlamydia/Gonorrhoeae NAA    Specimen: Throat; Swab   Result Value Ref Range    Chlamydia trachomatis, NAA Negative Negative    Gonorrhoeae NAA Negative Negative    CT/GC Specimen Type Swab     CT/GC Specimen Source Throat    Chlamydia/Gonorrhoeae NAA  Specimen: Rectum; Swab   Result Value Ref Range    Chlamydia trachomatis, NAA Negative Negative    Gonorrhoeae NAA Negative Negative    CT/GC Specimen Type Swab     CT/GC Specimen Source Rectum    Respiratory Pathogen Panel with COVID-19   Result Value Ref Range    Adenovirus Not Detected Not Detected    Coronavirus HKU1 Not Detected Not Detected    Coronavirus NL63 Not Detected Not Detected    Coronavirus 229E Not Detected Not Detected    Coronavirus OC43 PCR Not Detected Not Detected    Metapneumovirus Not Detected Not Detected    Rhinovirus/Enterovirus Not Detected Not Detected    Influenza A Not Detected Not Detected    Influenza B Not Detected Not Detected    Parainfluenza 1 Not Detected Not Detected    Parainfluenza 2 Not Detected Not Detected    Parainfluenza 3 Not Detected Not Detected    Parainfluenza 4 Not Detected Not Detected    RSV Not Detected Not Detected    Bordetella pertussis Not Detected Not Detected    Bordetella parapertussis Not Detected Not Detected    Chlamydophila (Chlamydia) pneumoniae Not Detected Not Detected    Mycoplasma pneumoniae Not Detected Not Detected    SARS-CoV-2 PCR Not Detected Not Detected   Rapid Group A Strep Antigen    Specimen: Throat   Result Value Ref Range    Rapid Strep A Screen Negative Negative   ALT   Result Value Ref Range    ALT 19 10 - 49 U/L   AST   Result Value Ref Range    AST 25 <=34 U/L   Bilirubin, total   Result Value Ref Range    Total Bilirubin 0.4 0.3 - 1.2 mg/dL   Basic Metabolic Panel   Result Value Ref Range    Sodium 136 135 - 145 mmol/L    Potassium 3.5 3.4 - 4.8 mmol/L    Chloride 104 98 - 107 mmol/L    CO2 29.2 20.0 - 31.0 mmol/L    Anion Gap 3 (L) 5 - 14 mmol/L    BUN 7 (L) 9 - 23 mg/dL    Creatinine 1.61 0.96 - 1.10 mg/dL    BUN/Creatinine Ratio 8     EGFR CKD-EPI Non-African American, Male 5 >=60 mL/min/1.37m2    EGFR CKD-EPI African American, Male >90 >=60 mL/min/1.74m2    Glucose 161 70 - 179 mg/dL    Calcium 04.5 8.7 - 40.9 mg/dL   HIV RNA, Quantitative, PCR   Result Value Ref Range    HIV RNA Quant Result Detected (A) Not Detected    HIV RNA 61,184 (H) <0 copies/mL    HIV RNA Log(10) 4.79 (H) <0.00 log copies/mL   Syphilis Screen   Result Value Ref Range    RPR Nonreactive Nonreactive   LYMPH MARKER LIMITED,FLOW   Result Value Ref Range    CD3% (T Cells) 80 61 - 86 %    Absolute CD3 Count 640 (L) 915-3,400 /uL    CD4% (T Helper) 21 (L) 34 - 58 %    Absolute CD4 Count 168 (L) 510-2,320 /uL    CD8% T Suppressor 59 (H) 12 - 38 %    Absolute CD8 Count 472 180-1,520 /uL    CD4:CD8 Ratio 0.4 (L) 0.9 - 4.8   Glucose 6 phosphate dehydrogenase   Result Value Ref Range    G6PD 7.7 5.3 - 10.3 U/g HGB   CBC w/ Differential  Result Value Ref Range    WBC 5.3 3.6 - 11.2 10*9/L    RBC 4.35 4.26 - 5.60 10*12/L    HGB 15.6 12.9 - 16.5 g/dL    HCT 95.6 21.3 - 08.6 %    MCV 101.7 (H) 77.6 - 95.7 fL    MCH 35.8 (H) 25.9 - 32.4 pg    MCHC 35.2 32.0 - 36.0 g/dL RDW 57.8 46.9 - 62.9 %    MPV 7.8 6.8 - 10.7 fL    Platelet 171 150 - 450 10*9/L    Neutrophils % 71.0 %    Lymphocytes % 16.0 %    Monocytes % 11.2 %    Eosinophils % 0.7 %    Basophils % 1.1 %    Absolute Neutrophils 3.7 1.8 - 7.8 10*9/L    Absolute Lymphocytes 0.8 (L) 1.1 - 3.6 10*9/L    Absolute Monocytes 0.6 0.3 - 0.8 10*9/L    Absolute Eosinophils 0.0 0.0 - 0.5 10*9/L    Absolute Basophils 0.1 0.0 - 0.1 10*9/L             Sexual Health & Screening Data  Gonorrhoeae NAA (no units)   Date Value Status   01/20/2021 Negative Final   01/20/2021 Negative Final     Chlamydia trachomatis, NAA (no units)   Date Value Status   01/20/2021 Negative Final   01/20/2021 Negative Final       Immunization History   Administered Date(s) Administered   ??? COVID-19 VACCINE,MRNA(MODERNA)(PF)(IM) 12/12/2019, 01/07/2020, 09/03/2020   ??? Hepatitis B, Adult 04/17/2012, 07/17/2012, 01/29/2013   ??? INFLUENZA TIV (TRI) PF (IM) 08/21/2007, 07/29/2008, 09/29/2009, 07/06/2010, 10/04/2011, 07/17/2012, 08/20/2013   ??? Influenza Vaccine Quad (IIV4 PF) 74mo+ injectable 07/29/2014, 08/20/2015, 06/28/2016, 08/02/2017, 07/31/2018, 10/22/2019, 07/06/2020   ??? Influenza Virus Vaccine, unspecified formulation 07/29/2014, 08/20/2015, 06/28/2016, 08/02/2017   ??? Novel Influenza-h1n1-09, All Formulations 11/04/2008   ??? PNEUMOCOCCAL POLYSACCHARIDE 23 11/25/2003, 02/17/2009, 01/12/2016   ??? Pneumococcal Conjugate 13-Valent 10/23/2012   ??? TdaP 02/22/2006, 06/19/2013

## 2021-02-02 NOTE — Unmapped (Signed)
Patient feels pretty lonely and that all of his friends and family have died (which is basically the case).  He states he really got depressed over holidays and through this year but over easter was first time he felt some joy again and at a full meal. Denies SI/HI even passive and states is willing to start taking his meds again which was something he wasn't doing when feeling really down. Also will consider coming to support group and engaging with social work. Does contract for safety

## 2021-02-02 NOTE — Unmapped (Signed)
Hard to know if worsening SOB is related to his COPD with superimposed allergic rhinitis and increased tobacco use or if potential role for PJP.  Given O2 sat is 93% and none from recent visit earlier in month, to be safe, will start atovoquone bid x 21 days to treat potential PJP. Will hold dapsone during treatment.  ALso contacted his primary pulmonologist to see if wants to see sooner than routine follow up - reassuring that recent CT scan chest for cancer in March was neg for acute illness and CXR from 4/7 was neg. Will follow up in 3-4 weeks or sooner if worsens

## 2021-02-05 DIAGNOSIS — B59 Pneumocystosis: Principal | ICD-10-CM

## 2021-02-05 MED ORDER — ATOVAQUONE 750 MG/5 ML ORAL SUSPENSION
ORAL | 11 refills | 30 days | Status: CP
Start: 2021-02-05 — End: 2022-02-05
  Filled 2021-02-05: qty 300, 30d supply, fill #0

## 2021-02-05 MED ORDER — TRAZODONE 150 MG TABLET
ORAL_TABLET | Freq: Every evening | ORAL | 3 refills | 30 days | Status: CP
Start: 2021-02-05 — End: ?
  Filled 2021-02-10: qty 30, 30d supply, fill #0

## 2021-02-05 NOTE — Unmapped (Signed)
[facility]   Patient Onboarding/Medication Counseling    Mr.Andrew Oconnell is a 65 y.o. male with PJP pneumonia who I am counseling today on initiation of therapy.  I am speaking to the patient.    Was a Nurse, learning disability used for this call? No    Verified patient's date of birth / HIPAA.    Specialty medication(s) to be sent: Infectious Disease: Atovaquone suspension      Non-specialty medications/supplies to be sent: n/a      Medications not needed at this time: n/a         Mepron (atovaquone)    Medication & Administration     Dosage: Take 5 mL (750mg ) by mouth twice daily for 21 days, then 10 mL (1500mg ) once daily thereafter  ** I told him to stop taking dapsone**  Administration:   ??? Shake gently before use  ??? Take with food  ??? Measure liquid carefully using measuring device provided with drug    Adherence/Missed dose instructions:  ??? Take a missed dose as soon as you think about it  ??? If it is close to the time for next dose, skip missed dose and resume normal schedule  ??? Do not take 2 doses at the same time or extra doses    Goals of Therapy     ??? To cure Pneumocystis jirovecii pneumonia (PJP)    Side Effects & Monitoring Parameters     ??? Common side effects  ??? Headache  ??? Nausea, vomiting, diarrhea, abdominal pain  ??? Skin rash  ??? Trouble sleeping  ??? Muscle pain or ache  ??? Flu-like symptoms- Stuffy or runny nose, cough, fever    ??? The following side effects should be reported to the provider:  ??? Allergic reaction  ??? Signs of infection  ??? Depression  ??? Thrush  ??? Dark urine, fatigue, lack of appetite, abdominal pain, light colored stool, vomiting, yellow skin/eyes    ??? Monitoring Parameters  ??? Hepatic function tests     Contraindications, Warnings, & Precautions     ??? Use caution in patients with severe hepatic impairment  ??? Use caution in elderly patients    Drug/Food Interactions     ??? Medication list reviewed in Epic. The patient was instructed to inform the care team before taking any new medications or supplements. No drug interactions identified.    Storage, Handling Precautions, & Disposal     ??? Store at room temperature in dry location  ??? Keep away from children and pets      Current Medications (including OTC/herbals), Comorbidities and Allergies     Current Outpatient Medications   Medication Sig Dispense Refill   ??? albuterol HFA 90 mcg/actuation inhaler Inhale 1 puff every six (6) hours as needed. 8 g 3   ??? atorvastatin (LIPITOR) 10 MG tablet TAKE 1 TABLET BY MOUTH  DAILY 90 tablet 3   ??? atovaquone (MEPRON) 750 mg/5 mL suspension Take 5 mL (750 mg total) by mouth Two (2) times a day for 21 days, THEN 10 mL (1500mg ) once daily thereafter 300 mL 11   ??? dolutegravir (TIVICAY) 50 mg TABLET Take 1 tablet (50 mg total) by mouth daily. 30 tablet 5   ??? emtricitabine-tenofovir alafen (DESCOVY) 200-25 mg tablet Take 1 tablet by mouth daily. 30 tablet 5   ??? fluticasone (FLONASE) 50 mcg/actuation nasal spray 1 spray by Each Nare route daily. 16 g 0   ??? lamoTRIgine (LAMICTAL) 200 MG tablet TAKE 1 TABLET BY  MOUTH  DAILY 90 tablet 3   ??? losartan (COZAAR) 25 MG tablet TAKE 1 TABLET BY MOUTH  DAILY 90 tablet 3   ??? naproxen sodium (ALEVE) 220 MG tablet Take 220 mg by mouth daily as needed for pain.     ??? tiotropium-olodateroL (STIOLTO RESPIMAT) 2.5-2.5 mcg/actuation Mist Inhale 2 puffs daily. 4 g 11   ??? traZODone (DESYREL) 150 MG tablet TAKE 1 TABLET BY MOUTH AT  NIGHT 30 tablet 11     No current facility-administered medications for this visit.       Allergies   Allergen Reactions   ??? Pollen Extracts      Congestion     ??? Sulfa (Sulfonamide Antibiotics) Rash       Patient Active Problem List   Diagnosis   ??? Bipolar affective disorder (CMS-HCC)   ??? Chronic hepatitis C (CMS-HCC)   ??? Panuveitis   ??? Refractive amblyopia   ??? Human immunodeficiency virus (HIV) disease (CMS-HCC)   ??? COPD (chronic obstructive pulmonary disease) (CMS-HCC)   ??? Hypercholesteremia   ??? Chronic renal insufficiency   ??? Cancer screening   ??? Tobacco abuse   ??? Dry skin   ??? Dysphagia   ??? Aortic root dilation (CMS-HCC)   ??? Left shoulder pain   ??? Insomnia   ??? 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 Benign prostatic hyperplasia with lower urinary tract symptoms   ??? Acute bronchitis   ??? 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 Depression       Reviewed and up to date in Epic.    Appropriateness of Therapy     Acute infections noted within Epic:  No active infections  Patient reported infection: active PJP pneumonia    Is medication and dose appropriate based on diagnosis and infection status? Yes    Prescription has been clinically reviewed: Yes      Baseline Quality of Life Assessment      How many days over the past month did your PJP pneumonia  keep you from your normal activities? For example, brushing your teeth or getting up in the morning. 0    Financial Information     Medication Assistance provided: None Required    Anticipated copay of $0.00 reviewed with patient. Verified delivery address.    Delivery Information     Scheduled delivery date: 02/06/21    Expected start date: 02/06/21    Medication will be delivered via Next Day Courier to the prescription address in [facility].  This shipment will not require a signature.      Explained the services we provide at [facility] and that each month we would call to set up refills.  Stressed importance of returning phone calls so that we could ensure they receive their medications in time each month.  Informed patient that we should be setting up refills 7-10 days prior to when they will run out of medication.  A pharmacist will reach out to perform a clinical assessment periodically.  Informed patient that a welcome packet, containing information about our pharmacy and other support services, a Notice of Privacy Practices, and a drug information handout will be sent.      Patient verbalized understanding of the above information as well as how to contact the pharmacy at [phone] option 4 with any questions/concerns.  The pharmacy is open Monday through Friday 8:30am-4:30pm.  A pharmacist is available 24/7 via pager to  answer any clinical questions they may have.    Patient Specific Needs     - Does the patient have any physical, cognitive, or cultural barriers? No    - Patient prefers to have medications discussed with  Patient     - Is the patient or caregiver able to read and understand education materials at a high school level or above? Yes    - Patient's primary language is  English     - Is the patient high risk? No    - Does the patient require a Care Management Plan? No     - Does the patient require physician intervention or other additional services (i.e. nutrition, smoking cessation, social work)? No      Michaiah Maiden [facility]  [facility] Specialty Pharmacist  2

## 2021-02-05 NOTE — Unmapped (Signed)
Contacted patient via mobile and confirmed two identifiers. Patient states that yesterday he noted 2 drops of dried blood to the front of his underwear, then this morning, found large drop of dried blood on his underwear. He denies fever, penile lesions, penile trauma, no sexual encounters for years. No hematuria, bleeding from gums, easy bruising. Patient cannot identify where bleeding is coming from but suspects from tip of penis.    Offered patient appointment for today. He is okay with waiting and seeing how he does over the weekend. I do have availability on Monday to see him if needed. I also advised patient to seek immediate care if symptoms worsen or change at any time.     He also mentions that he has not received treatment yet for PJP. I have reached out to [facility] to follow up about shipment. Reached G. Rochelle, pharmacist, and he is working on getting that out to the patient.

## 2021-02-05 NOTE — Unmapped (Signed)
[facility] Specialty Medication Onboarding    Specialty Medication: atovaquone 750 mg/5 mL  Prior Authorization: Not Required   Financial Assistance: No - copay  <$25  Final Copay/Day Supply: $0 / 39    Insurance Restrictions: None     Notes to Pharmacist: n/a    The triage team has completed the benefits investigation and has determined that the patient is able to fill this medication at [facility]. Please contact the patient to complete the onboarding or follow up with the prescribing physician as needed.

## 2021-02-12 NOTE — Unmapped (Signed)
[facility] Pharmacy Clinical Assessment & Refill Coordination Note    Andrew Oconnell, DOB: 1956/09/12  Phone: [phone] (home)     All above HIPAA information was verified with patient.     Was a Nurse, learning disability used for this call? No    Specialty Medication(s):   Infectious Disease: Descovy and Tivicay     Current Outpatient Medications   Medication Sig Dispense Refill   ??? albuterol HFA 90 mcg/actuation inhaler Inhale 1 puff every six (6) hours as needed. 8 g 3   ??? atorvastatin (LIPITOR) 10 MG tablet TAKE 1 TABLET BY MOUTH  DAILY 90 tablet 3   ??? atovaquone (MEPRON) 750 mg/5 mL suspension Take 5 mL (750 mg total) by mouth Two (2) times a day for 21 days, THEN 10 mL (1,500 mg total) daily. 300 mL 11   ??? dolutegravir (TIVICAY) 50 mg TABLET Take 1 tablet (50 mg total) by mouth daily. 30 tablet 5   ??? emtricitabine-tenofovir alafen (DESCOVY) 200-25 mg tablet Take 1 tablet by mouth daily. 30 tablet 5   ??? fluticasone (FLONASE) 50 mcg/actuation nasal spray 1 spray by Each Nare route daily. 16 g 0   ??? lamoTRIgine (LAMICTAL) 200 MG tablet TAKE 1 TABLET BY MOUTH  DAILY 90 tablet 3   ??? losartan (COZAAR) 25 MG tablet TAKE 1 TABLET BY MOUTH  DAILY 90 tablet 3   ??? naproxen sodium (ALEVE) 220 MG tablet Take 220 mg by mouth daily as needed for pain.     ??? tiotropium-olodateroL (STIOLTO RESPIMAT) 2.5-2.5 mcg/actuation Mist Inhale 2 puffs daily. 4 g 11   ??? traZODone (DESYREL) 150 MG tablet Take 1 tablet (150 mg total) by mouth nightly. 30 tablet 3     No current facility-administered medications for this visit.        Changes to medications: Orestes reports no changes at this time.    Allergies   Allergen Reactions   ??? Pollen Extracts      Congestion     ??? Sulfa (Sulfonamide Antibiotics) Rash       Changes to allergies: No    SPECIALTY MEDICATION ADHERENCE     Tivicay 50 mg: 9 days of medicine on hand   Descovy   : 9 days of medicine on hand       Medication Adherence    Patient reported X missed doses in the last month: 0  Specialty Medication: Tivicay 50mg   Patient is on additional specialty medications: Yes  Additional Specialty Medications: Descovy  Patient Reported Additional Medication X Missed Doses in the Last Month: 0  Patient is on more than two specialty medications: No  Demonstrates understanding of importance of adherence: yes  Informant: patient  Provider-estimated medication adherence level: good  Patient is at risk for Non-Adherence: No          Specialty medication(s) dose(s) confirmed: Regimen is correct and unchanged.     Are there any concerns with adherence? No    Adherence counseling provided? Not needed    CLINICAL MANAGEMENT AND INTERVENTION      Clinical Benefit Assessment:    Do you feel the medicine is effective or helping your condition? Yes    Clinical Benefit counseling provided? Not needed    Adverse Effects Assessment:    Are you experiencing any side effects? No    Are you experiencing difficulty administering your medicine? No    Quality of Life Assessment:    How many days over the past month  did your HIV  keep you from your normal activities? For example, brushing your teeth or getting up in the morning. 0    Have you discussed this with your provider? Not needed    Acute Infection Status:    Acute infections noted within Epic:  No active infections  Patient reported infection: currently being treated for PJP pneumonia    Therapy Appropriateness:    Is therapy appropriate? Yes, therapy is appropriate and should be continued    DISEASE/MEDICATION-SPECIFIC INFORMATION      N/A    PATIENT SPECIFIC NEEDS     - Does the patient have any physical, cognitive, or cultural barriers? No    - Is the patient high risk? No    - Does the patient require a Care Management Plan? No     - Does the patient require physician intervention or other additional services (i.e. nutrition, smoking cessation, social work)? No      SHIPPING     Specialty Medication(s) to be Shipped:   Infectious Disease: Descovy and Tivicay    Other medication(s) to be shipped: No additional medications requested for fill at this time     Changes to insurance: No    Delivery Scheduled: Yes, Expected medication delivery date: 02/16/21.     Medication will be delivered via UPS to the confirmed prescription address in [facility].    The patient will receive a drug information handout for each medication shipped and additional FDA Medication Guides as required.  Verified that patient has previously received a Conservation officer, historic buildings and a Surveyor, mining.    All of the patient's questions and concerns have been addressed.    [name]   [facility] Shared [facility] Specialty Pharmacist

## 2021-02-15 MED FILL — TIVICAY 50 MG TABLET: ORAL | 30 days supply | Qty: 30 | Fill #1

## 2021-02-15 MED FILL — DESCOVY 200 MG-25 MG TABLET: ORAL | 30 days supply | Qty: 30 | Fill #1

## 2021-02-16 NOTE — Unmapped (Signed)
PROMIS Tablet Screening  Completed Date: 02/02/2021     SW reviewed self-administered screening.    Patient had a PHQ-9 score of 7  indicating Mild depression.   Pt denies SI.   Pt scored 3 on AUDIT/AUDIT-C indicating Not-at-risk alcohol consumption  Pt  endorses Marijuana substance use in past 3 months.  Pt denies concerns for IPV.    Pt seen for regular ID visit.      Andrew Slovacek, LCSW, CCM

## 2021-02-19 MED ORDER — STIOLTO RESPIMAT 2.5 MCG-2.5 MCG/ACTUATION SOLUTION FOR INHALATION
Freq: Every day | RESPIRATORY_TRACT | 11 refills | 14 days
Start: 2021-02-19 — End: ?

## 2021-02-22 MED ORDER — STIOLTO RESPIMAT 2.5 MCG-2.5 MCG/ACTUATION SOLUTION FOR INHALATION
Freq: Every day | RESPIRATORY_TRACT | 11 refills | 14.00000 days | Status: CP
Start: 2021-02-22 — End: ?
  Filled 2021-03-03: qty 4, 30d supply, fill #0

## 2021-02-23 NOTE — Unmapped (Signed)
[facility] Shared [facility] Special[facility] Pharm[facility] Clinical Assessment & Refill Coordination Note    Andrew Oconnell, DOB: 1956/09/09  Phone: [phone] (home)     All above HIPAA information was verified with patient.     Was a Nurse, learning disability used for this call? No    Specialty Medication(s):   Infectious Disease: Atovaquone suspension     Current Outpatient Medications   Medication Sig Dispense Refill   ??? albuterol HFA 90 mcg/actuation inhaler Inhale 1 puff every six (6) hours as needed. 8 g 3   ??? atorvastatin (LIPITOR) 10 MG tablet TAKE 1 TABLET BY MOUTH  DAILY 90 tablet 3   ??? atovaquone (MEPRON) 750 mg/5 mL suspension Take 5 mL (750 mg total) by mouth Two (2) times a day for 21 days, THEN 10 mL (1,500 mg total) daily. 300 mL 11   ??? dolutegravir (TIVICAY) 50 mg TABLET Take 1 tablet (50 mg total) by mouth daily. 30 tablet 5   ??? emtricitabine-tenofovir alafen (DESCOVY) 200-25 mg tablet Take 1 tablet by mouth daily. 30 tablet 5   ??? fluticasone (FLONASE) 50 mcg/actuation nasal spray 1 spray by Each Nare route daily. 16 g 0   ??? lamoTRIgine (LAMICTAL) 200 MG tablet TAKE 1 TABLET BY MOUTH  DAILY 90 tablet 3   ??? losartan (COZAAR) 25 MG tablet TAKE 1 TABLET BY MOUTH  DAILY 90 tablet 3   ??? naproxen sodium (ALEVE) 220 MG tablet Take 220 mg by mouth daily as needed for pain.     ??? tiotropium-olodateroL (STIOLTO RESPIMAT) 2.5-2.5 mcg/actuation Mist Inhale 2 puffs daily. 4 g 11   ??? traZODone (DESYREL) 150 MG tablet Take 1 tablet (150 mg total) by mouth nightly. 30 tablet 3     No current facility-administered medications for this visit.        Changes to medications: [name] reports no changes at this time.    Allergies   Allergen Reactions   ??? Pollen Extracts      Congestion     ??? Sulfa (Sulfonamide Antibiotics) Rash       Changes to allergies: No    SPECIALTY MEDICATION ADHERENCE     atovaquone 750 mg/72ml: 10 days of medicine on hand       Medication Adherence    Patient reported X missed doses in the last month: 2  Specialty Medication: atovaquone          Specialty medication(s) dose(s) confirmed: Regimen is correct and unchanged.     Are there any concerns with adherence? Yes: patient reports forgetting to take 2 evening doses    Adherence counseling provided? Yes: suggested that he set an alarm to help him remember, but he will be switching to once daily dosing in a couple days.    CLINICAL MANAGEMENT AND INTERVENTION      Clinical Benefit Assessment:    Do you feel the medicine is effective or helping your condition? Yes    Clinical Benefit counseling provided? Not needed    Adverse Effects Assessment:    Are you experiencing any side effects? No    Are you experiencing difficulty administering your medicine? No    Quality of Life Assessment:    How many days over the past month did your PJP  keep you from your normal activities? For example, brushing your teeth or getting up in the morning. Patient declined to answer    Have you discussed this with your provider? Not needed    Acute Infection Status:  Acute infections noted within Epic:  No active infections  Patient reported infection: PJP    Therapy Appropriateness:    Is therapy appropriate? Yes, therapy is appropriate and should be continued    DISEASE/MEDICATION-SPECIFIC INFORMATION      N/A    PATIENT SPECIFIC NEEDS     - Does the patient have any physical, cognitive, or cultural barriers? No    - Is the patient high risk? No    - Does the patient require a Care Management Plan? No     - Does the patient require physician intervention or other additional services (i.e. nutrition, smoking cessation, social work)? No      SHIPPING     Specialty Medication(s) to be Shipped:   Infectious Disease: Atovaquone suspension    Other medication(s) to be shipped: No additional medications requested for fill at this time     Changes to insurance: No    Delivery Scheduled: Yes, Expected medication delivery date: 5/18.     Medication will be delivered via UPS to the confirmed prescription address in [facility].    The patient will receive a drug information handout for each medication shipped and additional FDA Medication Guides as required.  Verified that patient has previously received a Conservation officer, historic buildings and a Surveyor, mining.    The patient or caregiver noted above participated in the development of this care plan and knows that they can request review of or adjustments to the care plan at any time.      All of the patient's questions and concerns have been addressed.    Clydell Hakim   [facility] Specialty Pharmacist

## 2021-02-25 DIAGNOSIS — J449 Chronic obstructive pulmonary disease, unspecified: Principal | ICD-10-CM

## 2021-03-01 MED ORDER — ALBUTEROL SULFATE HFA 90 MCG/ACTUATION AEROSOL INHALER
Freq: Four times a day (QID) | RESPIRATORY_TRACT | 3 refills | 0.00000 days | Status: CP | PRN
Start: 2021-03-01 — End: ?

## 2021-03-02 ENCOUNTER — Ambulatory Visit: Admit: 2021-03-02 | Discharge: 2021-03-03 | Payer: MEDICARE | Attending: Family | Primary: Family

## 2021-03-02 DIAGNOSIS — I10 Essential (primary) hypertension: Principal | ICD-10-CM

## 2021-03-02 DIAGNOSIS — B20 Human immunodeficiency virus [HIV] disease: Secondary | ICD-10-CM | POA: Diagnosis not present

## 2021-03-02 DIAGNOSIS — E7800 Pure hypercholesterolemia, unspecified: Secondary | ICD-10-CM | POA: Diagnosis not present

## 2021-03-02 DIAGNOSIS — F32A Depression, unspecified depression type: Principal | ICD-10-CM

## 2021-03-02 DIAGNOSIS — B59 Pneumocystosis: Secondary | ICD-10-CM | POA: Diagnosis not present

## 2021-03-02 DIAGNOSIS — J449 Chronic obstructive pulmonary disease, unspecified: Secondary | ICD-10-CM | POA: Diagnosis not present

## 2021-03-02 DIAGNOSIS — Z113 Encounter for screening for infections with a predominantly sexual mode of transmission: Secondary | ICD-10-CM | POA: Diagnosis not present

## 2021-03-02 DIAGNOSIS — Z20822 Contact with and (suspected) exposure to covid-19: Secondary | ICD-10-CM | POA: Diagnosis not present

## 2021-03-02 LAB — BASIC METABOLIC PANEL
ANION GAP: 2 mmol/L — ABNORMAL LOW (ref 5–14)
BLOOD UREA NITROGEN: 14 mg/dL (ref 9–23)
BUN / CREAT RATIO: 14
CALCIUM: 9.8 mg/dL (ref 8.7–10.4)
CHLORIDE: 103 mmol/L (ref 98–107)
CO2: 29.2 mmol/L (ref 20.0–31.0)
CREATININE: 1 mg/dL
EGFR CKD-EPI AA MALE: 90 mL/min/{1.73_m2} (ref >=60)
EGFR CKD-EPI NON-AA MALE: 78 mL/min/{1.73_m2} (ref >=60)
GLUCOSE RANDOM: 112 mg/dL (ref 70–179)
POTASSIUM: 4.1 mmol/L (ref 3.4–4.8)
SODIUM: 134 mmol/L — ABNORMAL LOW (ref 135–145)

## 2021-03-02 LAB — CBC W/ AUTO DIFF
BASOPHILS ABSOLUTE COUNT: 0 10*9/L (ref 0.0–0.1)
BASOPHILS RELATIVE PERCENT: 0.6 %
EOSINOPHILS ABSOLUTE COUNT: 0.1 10*9/L (ref 0.0–0.5)
EOSINOPHILS RELATIVE PERCENT: 1.6 %
HEMATOCRIT: 38.1 % — ABNORMAL LOW (ref 39.0–48.0)
HEMOGLOBIN: 13.4 g/dL (ref 12.9–16.5)
LYMPHOCYTES ABSOLUTE COUNT: 1.2 10*9/L (ref 1.1–3.6)
LYMPHOCYTES RELATIVE PERCENT: 23.4 %
MEAN CORPUSCULAR HEMOGLOBIN CONC: 35.3 g/dL (ref 32.0–36.0)
MEAN CORPUSCULAR HEMOGLOBIN: 35.4 pg — ABNORMAL HIGH (ref 25.9–32.4)
MEAN CORPUSCULAR VOLUME: 100.4 fL — ABNORMAL HIGH (ref 77.6–95.7)
MEAN PLATELET VOLUME: 7.4 fL (ref 6.8–10.7)
MONOCYTES ABSOLUTE COUNT: 0.4 10*9/L (ref 0.3–0.8)
MONOCYTES RELATIVE PERCENT: 7.1 %
NEUTROPHILS ABSOLUTE COUNT: 3.4 10*9/L (ref 1.8–7.8)
NEUTROPHILS RELATIVE PERCENT: 67.3 %
NUCLEATED RED BLOOD CELLS: 0 /100{WBCs} (ref <=4)
PLATELET COUNT: 133 10*9/L — ABNORMAL LOW (ref 150–450)
RED BLOOD CELL COUNT: 3.79 10*12/L — ABNORMAL LOW (ref 4.26–5.60)
RED CELL DISTRIBUTION WIDTH: 14.2 % (ref 12.2–15.2)
WBC ADJUSTED: 5.1 10*9/L (ref 3.6–11.2)

## 2021-03-02 LAB — ALT: ALT (SGPT): 24 U/L (ref 10–49)

## 2021-03-02 LAB — AST: AST (SGOT): 33 U/L (ref <=34)

## 2021-03-02 LAB — BILIRUBIN, TOTAL: BILIRUBIN TOTAL: 0.8 mg/dL (ref 0.3–1.2)

## 2021-03-02 MED ORDER — ATORVASTATIN 10 MG TABLET
ORAL_TABLET | Freq: Every day | ORAL | 5 refills | 30 days | Status: CP
Start: 2021-03-02 — End: ?
  Filled 2021-03-03: qty 30, 30d supply, fill #0

## 2021-03-02 MED ORDER — ALBUTEROL SULFATE HFA 90 MCG/ACTUATION AEROSOL INHALER
Freq: Four times a day (QID) | RESPIRATORY_TRACT | 0 refills | 0.00000 days | Status: CP | PRN
Start: 2021-03-02 — End: ?
  Filled 2021-03-03: qty 8.5, 50d supply, fill #0

## 2021-03-02 MED ORDER — LOSARTAN 25 MG TABLET
ORAL_TABLET | Freq: Every day | ORAL | 5 refills | 30 days | Status: CP
Start: 2021-03-02 — End: ?
  Filled 2021-03-03: qty 30, 30d supply, fill #0

## 2021-03-02 MED ORDER — ATOVAQUONE 750 MG/5 ML ORAL SUSPENSION
Freq: Every day | ORAL | 5 refills | 30 days | Status: CP
Start: 2021-03-02 — End: ?
  Filled 2021-03-02: qty 300, 30d supply, fill #0

## 2021-03-02 MED ORDER — LAMOTRIGINE 200 MG TABLET
ORAL_TABLET | Freq: Every day | ORAL | 5 refills | 30 days | Status: CP
Start: 2021-03-02 — End: ?
  Filled 2021-03-03: qty 30, 30d supply, fill #0

## 2021-03-02 NOTE — Unmapped (Signed)
Assessment/Plan:      Andrew Oconnell, a 65 y.o. male seen today for urgent evaluation of upper respiratory symptoms.    Plan:    Presumptive treatment for PJP - acute, resolved  Sore throat, cough, chills, night sweats, decreased appetite, has really dry mouth x 4 weeks -resolved  Took full course of Atovaquone 750mg  BID x 21 days and now taking 1500mg  daily for OI prophylaxis.  Has better appetite, put on 3#. Has some wheezing at night but has been without Stiolto and albuterol.   Roommate diagnosed with CoVid yesterday (03/01/21), patient asymptomatic, has had 2 negative CoVid tests at home  ?? Obtain rapid flu/CoVid test  ?? Taking Descovy/Tivicay consistently  ?? O2 sat 96% today. Lungs clear bilaterally.      HIV - chronic  Previously engaged in care with Dr. Loma Boston but now will be following with me.  Fills ART via Medicare.   Lab Results   Component Value Date    ACD4 168 (L) 01/20/2021    CD4 21 (L) 01/20/2021    HIVCP 61,184 (H) 01/20/2021    HIVRS Detected (A) 01/20/2021     ?? Continue current therapy. On Descovy/Tivicay.   ?? Checking HIV RNA & safety labs (brief return).  ?? Encouraged continued excellent ARV adherence.  ?? Refilled Atovaquone daily.  ?? Refilled chronic medications as it looks as though patient will soon run out.      Mental Health  Recently feeling lonely, he's not sure this has to do with the winter as well.  His sister passed away in 2016/05/01, then her husband passed 2018/05/01, and recently he has some close people to him pass away as well. And I'm still here.  He believes this contributed to him not trying to restart ART.  ?? Mood improved.  ?? Currently on Lamictal 200mg  daily  ?? Open to referral to Psychiatry to re-assess medications. Patient currently on Lamictal daily.   ?? Referral placed.  ?? Busy with taking care of his roommate with CoVid.       Sexual health & secondary prevention  Has not had sexual contact for some time.  Lab Results   Component Value Date    RPR Nonreactive 01/20/2021 CTNAA Negative 01/20/2021    CTNAA Negative 01/20/2021    GCNAA Negative 01/20/2021    GCNAA Negative 01/20/2021    SPECTYPE Swab 01/20/2021    SPECTYPE Swab 01/20/2021    SPECSOURCE Throat 01/20/2021    SPECSOURCE Rectum 01/20/2021     ?? GC/CT NAATs -- obtained today, urine only (unable to leave sample at last visit)  ?? RPR -- not checked today    Immunization History   Administered Date(s) Administered   ??? COVID-19 VACCINE,MRNA(MODERNA)(PF)(IM) 12/12/2019, 01/07/2020, 09/03/2020   ??? Hepatitis B, Adult 04/17/2012, 07/17/2012, 01/29/2013   ??? INFLUENZA TIV (TRI) PF (IM) 08/21/2007, 07/29/2008, 09/29/2009, 07/06/2010, 10/04/2011, 07/17/2012, 08/20/2013   ??? Influenza Vaccine Quad (IIV4 PF) 27mo+ injectable 07/29/2014, 08/20/2015, 06/28/2016, 08/02/2017, 07/31/2018, 10/22/2019, 07/06/2020   ??? Influenza Virus Vaccine, unspecified formulation 07/29/2014, 08/20/2015, 06/28/2016, 08/02/2017   ??? Novel Influenza-h1n1-09, All Formulations 11/04/2008   ??? PNEUMOCOCCAL POLYSACCHARIDE 23 11/25/2003, 02/17/2009, 01/12/2016   ??? Pneumococcal Conjugate 13-Valent 10/23/2012   ??? TdaP 02/22/2006, 06/19/2013     ?? Immunizations ordered today: none. Would like 2nd booster but in light of prolonged close contact with partner with CoVid, will await today's test results and if he develops any symptoms in the next 5 days. If he tests positive, will treat  with CoVid treatment.  ?? He will return in 2 weeks for close follow up and 2nd booster dose.    Counseling services took more than 50% of today's visit time.  Counseled today regarding ART restart, symptoms.    Disposition  Return to clinic 1-2 weeks or sooner if needed.    Varney Daily, FNP-BC  [facility]  [address] [place], [place] [address]    Phone: [phone]   Fax: [phone]          Subjective:      Chief Complaint   Follow up visit for upper respiratory infection/PJP presumptive treatment.    HPI in addition to A/P:  Follow up visit for Andrew Oconnell, a 65 y.o. male   ?? Improved mood, improved appetite  ?? Adherent to ART.  ?? Has run out of chronic bronchitis meds, needs follow up with in June with Dr. Sallyanne Kuster.  Denies any fever, chills, nausea, vomiting, rash, urinary complaints, diarrhea, constipation.  ?? Has noticed some wheezing at night  ?? Denies chest discomfort, SOB.  ?? Has been out of Stiolto x 1 week  ?? Need Albuterol. [facility]ill [facility] to [facility].  ?? Need appointment with Dr. Sallyanne Kuster.  ?? Looking for new PCP in [place]. Suggested finding a geriatrician. He likes this idea. Sister is a doctor and is also trying to find him a new PCP, maybe through [facility]      Past Medical History:   Diagnosis Date   ??? Chronic renal insufficiency 06/12/2013   ??? COPD (chronic obstructive pulmonary disease) (CMS-HCC) 01/29/2013   ??? Emphysema of lung (CMS-HCC)    ??? Hepatitis C    ??? Human immunodeficiency virus (HIV) disease (CMS-HCC) 01/29/2013   ??? Hypercholesteremia 01/29/2013   ??? Reflux    ??? Vomiting     after eating       Medications and Allergies   Reviewed and updated today. See bottom of this visit's encounter summary for details.  Current Outpatient Medications on File Prior to Visit   Medication Sig   ??? albuterol HFA 90 mcg/actuation inhaler Inhale 1 puff every six (6) hours as needed.   ??? atovaquone (MEPRON) 750 mg/5 mL suspension Take 5 mL (750 mg total) by mouth Two (2) times a day for 21 days, THEN 10 mL (1,500 mg total) daily.   ??? dolutegravir (TIVICAY) 50 mg TABLET Take 1 tablet (50 mg total) by mouth daily.   ??? emtricitabine-tenofovir alafen (DESCOVY) 200-25 mg tablet Take 1 tablet by mouth daily.   ??? naproxen sodium (ALEVE) 220 MG tablet Take 220 mg by mouth daily as needed for pain.   ??? tiotropium-olodateroL (STIOLTO RESPIMAT) 2.5-2.5 mcg/actuation Mist Inhale 2 puffs daily.   ??? traZODone (DESYREL) 150 MG tablet Take 1 tablet (150 mg total) by mouth nightly.   ??? fluticasone (FLONASE) 50 mcg/actuation nasal spray 1 spray by Each Nare route daily.     No current facility-administered medications on file prior to visit.       Allergies   Allergen Reactions   ??? Pollen Extracts      Congestion     ??? Sulfa (Sulfonamide Antibiotics) Rash       Social History  Social History     Tobacco Use   ??? Smoking status: Current Every Day Smoker     Packs/day: 0.50     Years: 43.00     Pack years: 21.50     Types: Cigarettes  Start date: 01/29/1970   ??? Smokeless tobacco: Never Used   ??? Tobacco comment: 10 cigarettes/day   Substance Use Topics   ??? Alcohol use: Yes     Alcohol/week: 0.0 standard drinks     Comment: sunday ~ 2 mixed drinks (vodka)        Review of Systems  As per HPI. Remainder of 10 systems reviewed, negative.        Objective:      BP 149/75  - Pulse 90  - Temp 36.7 ??C (98.1 ??F) (Oral)  - Ht 195.6 cm (6' 5)  - Wt 97.2 kg (214 lb 3.2 oz)  - BMI 25.40 kg/m??     Const looks well and attentive, alert, appropriate   Eyes sclerae anicteric, noninjected OU   ENT masked   Lymph no cervical or supraclavicular LAD   CV RRR. No murmurs. No rub or gallop. S1/S2.   Resp CTAB ant/post, normal work of breathing, initially noted occasional wheezing when auscultated initially but cleared with coughing.   GI Soft. NTND. NABS.   GU deferred   Rectal deferred   Skin no petechiae, ecchymoses or obvious rashes on clothed exam   MSK normal ROM throughout   Neuro grossly intact   Psych Appropriate affect. Eye contact good. Linear thoughts. Fluent speech.     Laboratory Data  Reviewed in Epic today, using Synopsis and Chart Review filters.    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

## 2021-03-02 NOTE — Unmapped (Signed)
Roommate tested positive for Co-vid yesterday evening. Two home test ( negative). Please send  Albuterol to [facility] shared [employer]

## 2021-03-03 NOTE — Unmapped (Signed)
Pt left message on nursing VM regarding Albuterol refill. Pt would like prescription sent to [facility]. Pharmacy was contacted; they have received prescription request and it will be delivered tomorrow. Called pt.

## 2021-03-08 LAB — HIV RNA, QUANTITATIVE, PCR
HIV RNA LOG(10): 2.41 {Log_copies}/mL — ABNORMAL HIGH (ref <0.00)
HIV RNA QNT RSLT: DETECTED — AB
HIV RNA: 258 {copies}/mL — ABNORMAL HIGH (ref <0)

## 2021-03-09 NOTE — Unmapped (Signed)
[facility] Specialty Pharmacy Refill Coordination Note    Specialty Medication(s) to be Shipped:   Infectious Disease: Descovy and Tivicay    Other medication(s) to be shipped: n/a     Andrew Oconnell, DOB: [birth year]  Phone: [phone] (home)       All above HIPAA information was verified with patient.     Was a Nurse, learning disability used for this call? No    Completed refill call assessment today to schedule patient's medication shipment from the [facility] [phone]).  All relevant notes have been reviewed.     Specialty medication(s) and dose(s) confirmed: Regimen is correct and unchanged.   Changes to medications: [name] reports no changes at this time.  Changes to insurance: No  New side effects reported not previously addressed with a pharmacist or physician: None reported  Questions for the pharmacist: No    Confirmed patient received a Conservation officer, historic buildings and a Surveyor, mining with first shipment. The patient will receive a drug information handout for each medication shipped and additional FDA Medication Guides as required.       DISEASE/MEDICATION-SPECIFIC INFORMATION        N/A    SPECIALTY MEDICATION ADHERENCE     Medication Adherence    Patient reported X missed doses in the last month: 0  Specialty Medication: tivicay  Patient is on additional specialty medications: Yes  Additional Specialty Medications: descovy  Patient Reported Additional Medication X Missed Doses in the Last Month: 0              Were doses missed due to medication being on hold? No    tivicay  : 12 days of medicine on hand   descovy  : 12 days of medicine on hand       REFERRAL TO PHARMACIST     Referral to the pharmacist: Not needed      [facility]     Shipping address confirmed in Epic.     Delivery Scheduled: Yes, Expected medication delivery date: 6/2.     Medication will be delivered via UPS to the prescription address in Epic [facility].    Westley Gambles   [facility] Specialty Technician

## 2021-03-10 MED FILL — TRAZODONE 150 MG TABLET: ORAL | 30 days supply | Qty: 30 | Fill #1

## 2021-03-17 MED FILL — DESCOVY 200 MG-25 MG TABLET: ORAL | 30 days supply | Qty: 30 | Fill #2

## 2021-03-17 MED FILL — TIVICAY 50 MG TABLET: ORAL | 30 days supply | Qty: 30 | Fill #2

## 2021-03-18 ENCOUNTER — Ambulatory Visit
Admit: 2021-03-18 | Discharge: 2021-03-19 | Payer: MEDICARE | Attending: Student in an Organized Health Care Education/Training Program | Primary: Student in an Organized Health Care Education/Training Program

## 2021-03-18 DIAGNOSIS — F32A Depression, unspecified depression type: Principal | ICD-10-CM

## 2021-03-18 DIAGNOSIS — Z6825 Body mass index (BMI) 25.0-25.9, adult: Secondary | ICD-10-CM | POA: Diagnosis not present

## 2021-03-18 DIAGNOSIS — Z23 Encounter for immunization: Secondary | ICD-10-CM | POA: Diagnosis not present

## 2021-03-18 MED ORDER — LAMOTRIGINE 200 MG TABLET
ORAL_TABLET | Freq: Every day | ORAL | 1 refills | 90.00000 days | Status: CP
Start: 2021-03-18 — End: 2021-09-14
  Filled 2021-03-25: qty 90, 90d supply, fill #0

## 2021-03-18 MED ORDER — ARIPIPRAZOLE 2 MG TABLET
ORAL_TABLET | Freq: Every day | ORAL | 1 refills | 90 days | Status: CP
Start: 2021-03-18 — End: 2021-09-14
  Filled 2021-03-25: qty 90, 90d supply, fill #0

## 2021-03-18 MED ORDER — TRAZODONE 100 MG TABLET
ORAL_TABLET | Freq: Every evening | ORAL | 2 refills | 90 days | Status: CP
Start: 2021-03-18 — End: 2021-12-13
  Filled 2021-03-25: qty 180, 90d supply, fill #0
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
## 2021-03-24 DIAGNOSIS — F3170 Bipolar disorder, currently in remission, most recent episode unspecified: Principal | ICD-10-CM

## 2021-04-01 MED FILL — LOSARTAN 25 MG TABLET: ORAL | 30 days supply | Qty: 30 | Fill #1

## 2021-04-01 MED FILL — ATORVASTATIN 10 MG TABLET: ORAL | 30 days supply | Qty: 30 | Fill #1

## 2021-04-09 MED FILL — STIOLTO RESPIMAT 2.5 MCG-2.5 MCG/ACTUATION SOLUTION FOR INHALATION: RESPIRATORY_TRACT | 30 days supply | Qty: 4 | Fill #1

## 2021-04-09 NOTE — Unmapped (Signed)
Patient called after receiving a letter regarding his HMAP coverage. Patient has a clinic appt in July. Told patient Benefits would renew his application at his clinic appt.      [name]  ID Clinic Benefits Counselor  Time of Intervention-13mins

## 2021-04-15 ENCOUNTER — Ambulatory Visit
Admit: 2021-04-15 | Discharge: 2021-04-16 | Payer: MEDICARE | Attending: Student in an Organized Health Care Education/Training Program | Primary: Student in an Organized Health Care Education/Training Program

## 2021-04-15 DIAGNOSIS — F4310 Post-traumatic stress disorder, unspecified: Secondary | ICD-10-CM | POA: Diagnosis not present

## 2021-04-15 DIAGNOSIS — F3131 Bipolar disorder, current episode depressed, mild: Secondary | ICD-10-CM | POA: Diagnosis not present

## 2021-04-15 DIAGNOSIS — Z6825 Body mass index (BMI) 25.0-25.9, adult: Secondary | ICD-10-CM | POA: Diagnosis not present

## 2021-04-15 NOTE — Unmapped (Signed)
[facility] Refill Coordination Note    Specialty Medication(s) to be Shipped:   Infectious Disease: Descovy and Tivicay    Other medication(s) to be shipped: No additional medications requested for fill at this time     Andrew Oconnell, DOB: May 02, 1956  Phone: [phone] (home)       All above HIPAA information was verified with patient.     Was a Nurse, learning disability used for this call? No    Completed refill call assessment today to schedule patient's medication shipment from the [facility] [phone]).  All relevant notes have been reviewed.     Specialty medication(s) and dose(s) confirmed: Regimen is correct and unchanged.   Changes to medications: Onie reports no changes at this time.  Changes to insurance: No  New side effects reported not previously addressed with a pharmacist or physician: None reported  Questions for the pharmacist: No    Confirmed patient received a Conservation officer, historic buildings and a Surveyor, mining with first shipment. The patient will receive a drug information handout for each medication shipped and additional FDA Medication Guides as required.       DISEASE/MEDICATION-SPECIFIC INFORMATION        N/A    SPECIALTY MEDICATION ADHERENCE     Medication Adherence    Patient reported X missed doses in the last month: 0  Specialty Medication: tivicay  Patient is on additional specialty medications: Yes  Additional Specialty Medications: descovy  Patient Reported Additional Medication X Missed Doses in the Last Month: 0              Were doses missed due to medication being on hold? No    tivicay  : 9 days of medicine on hand   descovy  : 9 days of medicine on hand       REFERRAL TO PHARMACIST     Referral to the pharmacist: Not needed      [facility]     Shipping address confirmed in Epic.     Delivery Scheduled: Yes, Expected medication delivery date: 7/7.     Medication will be delivered via UPS to the prescription address in Epic WAM.    Westley Gambles   [facility] Specialty Technician

## 2021-04-15 NOTE — Unmapped (Signed)
The patient has requested a fill of a specialty medication via the IVR/MyChart. Please contact patient to confirm delivery of medications below as appropriate.    Specialty Medication Requested: Descovy and Tivicay  Other Medications/Supplies Requested: n/a
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
## 2021-04-21 MED FILL — DESCOVY 200 MG-25 MG TABLET: ORAL | 30 days supply | Qty: 30 | Fill #3

## 2021-04-21 MED FILL — TIVICAY 50 MG TABLET: ORAL | 30 days supply | Qty: 30 | Fill #3

## 2021-04-30 MED FILL — ATORVASTATIN 10 MG TABLET: ORAL | 30 days supply | Qty: 30 | Fill #2

## 2021-04-30 MED FILL — LOSARTAN 25 MG TABLET: ORAL | 30 days supply | Qty: 30 | Fill #2

## 2021-05-03 ENCOUNTER — Ambulatory Visit: Admit: 2021-05-03 | Discharge: 2021-05-03 | Payer: MEDICARE | Attending: Family | Primary: Family

## 2021-05-03 ENCOUNTER — Ambulatory Visit: Admit: 2021-05-03 | Discharge: 2021-05-03 | Payer: MEDICARE

## 2021-05-03 DIAGNOSIS — B20 Human immunodeficiency virus [HIV] disease: Principal | ICD-10-CM

## 2021-05-03 DIAGNOSIS — J449 Chronic obstructive pulmonary disease, unspecified: Principal | ICD-10-CM

## 2021-05-03 DIAGNOSIS — Z6825 Body mass index (BMI) 25.0-25.9, adult: Secondary | ICD-10-CM | POA: Diagnosis not present

## 2021-05-03 DIAGNOSIS — J439 Emphysema, unspecified: Secondary | ICD-10-CM | POA: Diagnosis not present

## 2021-05-03 NOTE — Unmapped (Signed)
Ankle swelling intermittent

## 2021-05-03 NOTE — Unmapped (Signed)
Name: [name]  Date: 05/03/2021  Address: [address] [place] [address]   Phone: [phone]     Started assessment with patient options: in clinic     Is this the same address for mailing? Yes  If No, Mailing Address is:     Housing Status  [employer]  Medicare Part C    Tax Press photographer Status  Single    Employment Status  Retired    Therapist, sports (Retirement/Survivor's/Disability)    If no or low income, how are you meeting your basic needs?  Not Applicable    List Tax Household Members including relationship to you:   N/A    Someone in my household receives: Not Applicable (for household members)  Specify who: N/A    Do you have a current diagnosis for Hepatitis C?  Lab Results   Component Value Date    HEPCAB Positive (A) 01/10/2015    HCVRNA Not Detected 07/31/2018    HCVIU [id number] 07/29/2014    HCVRNAIU 16 03/19/2015    HCVGENOTYPE 1a 08/21/2014       Have you used tobacco products four or more times per week in the last six months?  Yes    Patient was informed of the following programs;   N/A    The following applications/handouts were given to patient:   N/A    The following forms were also started with the patient:   N/A    Federal Marketplace Eligibility Assessment  Patient is not eligible as they have insurance.     [name] application status: Complete    Patient is applying for HMAP- SPAP (Medicare drug copay assistance)    Additional Comments: No issues accessing meds.      [name]  ID Clinic Benefits Counselor  Time of Intervention-34mins

## 2021-05-03 NOTE — Unmapped (Unsigned)
INFECT[facility]OUS DISEASES CLINIC  [address]  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 Current regimen: Descovy (FTC/TAF) and dolutegravir  Misses doses of ARVs rarely    Med access through insurance  CD4 count 300's today but continue with Atovaquone for now, especially in light of recent presumptive PJP treatment  Discussed ARV adherence, continuing prophylaxis for OI and taking ARVs with food    Lab Results   Component Value Date    ACD4 385 (L) 05/03/2021    CD4 35 05/03/2021    HIVRS Detected (A) 03/02/2021    HIVCP 258 (H) 03/02/2021     ??? CD4, HIV RNA, and safety labs (full return panel)  ??? Continue current therapy  ??? Discussed importance of ARV adherence  ??? Patient had stopped taking Atovaquone after treatment did not realize he needed to continue with prophylaxis dose. Stressed importance of taking Oconnell for now.      Recent presumptive treatment for PJP - acute, resolved  Sore throat, cough, chills, night sweats, decreased appetite, has really dry mouth x 4 weeks   Took full course of Atovaquone 750mg  BID x 21 days and now taking 1500mg  Oconnell for OI prophylaxis.  Has better appetite, put on 3#. Has some wheezing at night but has been without Stiolto and albuterol.   Roommate diagnosed with CoVid at same time as his URI symptoms(03/01/21), patient tested negative for CoVid multiple times  ?? Taking Descovy/Tivicay consistently  ?? Told to restart OI prophylaxis with Atovaquone.      Chronic hepatitis C (RAF-HCC)/ HCC screening - chronic, stable  Genotype 1a, Grade 1, stage II disease by liver biopsy 01/31/2003  Previously treated in 07/28/2003 to 01/20/2004 at [facility] requiring early discontinuation secondary to significant side effects including what was thought to be interstitial pneumonitis. While on therapy required addition of both Neupogen and Procrit secondary to medication-induced cytopenias.  Treated again in 02/28/15 with Sofosbuvir and daclastasvir x 12 weeks   Followed by Hepatology, Dr. Arnette Felts FNP, last seen 11/25/19  12/02/2019 MRI of abdomen: Cirrhotic hepatic morphology with mild splenomegaly (14.3cm). No ascites. No suspicious hepatic lesions.  ?? 07/31/2018 Hep C RNA negative  ?? Needs Hep C RNA at next visit  ?? Continue follow up with Hepatology, now overdue. Stressed importance with patient to call for appointment.      Aortic root dilation (RAF-HCC) - chronic  Previously followed by Dr. Andrey Farmer with [facility], last seen 09/17/2015  Negative genetic testing for Marfan's Syndrome  09/30/15 Echocardiogram performed EF>55%, mild mitral prolapse and root dilation  ?? Continue Losartan for HTN  ??    COPD (chronic obstructive pulmonary disease) - chronic  Manages by Dr. Laverle Patter  On Stiolto and albuterol  12/15/20 CT Lung Cancer screening shows:   --0.4 cm right middle lobe nodule is unchanged in appearance since 2014.??  --0.3 cm left apical nodule is new since 12/02/2019.  --Severe emphysema.  Lung-RADS Category: 2  Follow-up Recommendation: Follow Up LDCT in 1 Year (12/2021)  ?? Stressed importance of follow up with Dr. Sallyanne Kuster on 05/17/21.  ?? Patient had very distant heart sounds today on exam. Dr. Sallyanne Kuster in to auscultate. Ordered chest x-ray today to rule out pericardial effusion.  ??  Chest x-ray unremarkable. Lung findings of emphysema with no acute cardiopulmonary abnormality.      Mental Health  Recently feeling lonely, he's not sure this has to do with the winter as well.  His sister passed away in 2016/04/13, then her husband passed 2018/04/13, and recently he has some close people to him pass away as well. And I'm still here.  He believes this contributed to him not trying to restart ART.  ?? Mood improved.  ?? Currently on Lamictal 200mg  Oconnell  ?? Established care with [facility] psychiatry, added Abililfy 2mg  Oconnell      History of chronic renal insufficiency  Stable Scr 1.0-1.16      Nicotine dependence  - chronic, stable  ??? Revisit readiness at next appt.      Sexual health & secondary prevention  - chronic, stable  Not in relationship.   Parts of body used during sex include: anus/rectum, mouth and penis. Versatile for anal sex. Gives and receives oral sex. Does not have vaginal sex.   Since last visit has had UNKNOWN sex and has not had add'l STI screening.  He does routinely discuss HIV status with partner(s).  Have not discussed interest in having children.    Lab Results   Component Value Date    RPR Nonreactive 01/20/2021    RPR Nonreactive 10/22/2019    CTNAA Negative 03/02/2021    CTNAA Negative 01/20/2021    CTNAA Negative 01/20/2021    GCNAA Negative 03/02/2021    GCNAA Negative 01/20/2021    GCNAA Negative 01/20/2021    SPECSOURCE Urine 03/02/2021    SPECSOURCE Throat 01/20/2021    SPECSOURCE Rectum 01/20/2021     ??? GC/CT NAATs - obtained today from all exposed anatomical site(s)  ??? RPR - Needed at next visit      Health maintenance  - chronic, stable  PCP: Has not yet called [facility] to establish care but he states he will do so today.    Oral health  He does not have a dentist. Last dental exam unknown.    Eye health  He does  use corrective lenses. Last eye exam unknown.    Metabolic conditions  Wt Readings from Last 5 Encounters:   05/03/21 97.4 kg (214 lb 12.8 oz)   04/15/21 96.4 kg (212 lb 9.6 oz)   03/18/21 97.1 kg (214 lb)   03/02/21 97.2 kg (214 lb 3.2 oz)   02/02/21 95.8 kg (211 lb 3.2 oz)     Lab Results   Component Value Date    CREATININE 1.08 05/03/2021    PROTEINUR 58.0 03/25/2020    PCRATIOUR 306 03/25/2020    GLU 116 05/03/2021    A1C 5.0 03/25/2020    ALT 17 05/03/2021    ALT 24 03/02/2021    ALT 19 01/20/2021    VITDTOTAL 26.3 11/25/2019     # Kidney health - creatinine today  # Bone health - Last DEXA 07/2018  and FRAX score Major osteoporotic 6.1%/Hip Fracture 1.3% on this date: 05/04/2021 T score -1.0, repeat in 5 years 04/2026  # Diabetes assessment - random glucose today  # NAFLD assessment - Needs to follow back up with Hepatology    Communicable diseases  Lab Results   Component Value Date    HEPAIGG Reactive (A) 05/21/2015    HEPBSAB Reactive 01/10/2015    HEPCAB Positive (A) 01/10/2015    HCVRNA Not Detected 07/31/2018    HCVRNAIU 16 03/19/2015    HCVIU [id number] 07/29/2014     #  TB screening - assessment needed but deferred to future visit  # Hepatitis screening - Treated  # MMR screening - not assessed    Cancer screening  Lab Results   Component Value Date    PSA 0.16 05/02/2017    FINALDX  10/12/2018     A: Stomach, biopsy  - Gastric fundic and antral mucosa with chronic superficial gastritis  - No Helicobacter pylori identified on H&E stain    This electronic signature is attestation that the pathologist personally reviewed the submitted material(s) and the final diagnosis reflects that evaluation.       # Anorectal - not yet done  # Colorectal - Has been ordered but patient's sister not available to take him till August.  # Liver - Defer to Hepatology  # Lung - repeat low-dose CT 69m from prior  # Prostate - Discuss with patient    Cardiovascular disease  Lab Results   Component Value Date    CHOL 123 03/25/2020    HDL 66 03/25/2020    LDL 41 03/25/2020    NONHDL 103 10/04/2011    TRIG 79 03/25/2020     # The 10-year ASCVD risk score Denman George DC Jr., et al., 2013) is: 16.3%  - is not taking aspirin   - is taking statin  - BP control fair  - current smoker  # AAA screening - ultrasound ordered    Immunization History   Administered Date(s) Administered   ??? COVID-19 VACCINE, MRNA(MODERNA)(BOOSTER)(PF) 03/18/2021   ??? COVID-19 VACCINE,MRNA(MODERNA)(PF)(IM) 12/12/2019, 01/07/2020, 09/03/2020   ??? Hepatitis B, Adult 04/17/2012, 07/17/2012, 01/29/2013   ??? INFLUENZA TIV (TRI) PF (IM) 08/21/2007, 07/29/2008, 09/29/2009, 07/06/2010, 10/04/2011, 07/17/2012, 08/20/2013 ??? Influenza Vaccine Quad (IIV4 PF) 76mo+ injectable 07/29/2014, 08/20/2015, 06/28/2016, 08/02/2017, 07/31/2018, 10/22/2019, 07/06/2020   ??? Influenza Virus Vaccine, unspecified formulation 07/29/2014, 08/20/2015, 06/28/2016, 08/02/2017   ??? Novel Influenza-h1n1-09, All Formulations 11/04/2008   ??? PNEUMOCOCCAL POLYSACCHARIDE 23 11/25/2003, 02/17/2009, 01/12/2016   ??? Pneumococcal Conjugate 13-Valent 10/23/2012   ??? TdaP 02/22/2006, 06/19/2013     ??? Immunizations today - Shingrix needed, held off due to previously low CD4 count(168/21%) and detectable viral load.      Counseling services took more than 50% of today's visit.  I personally spent 25 minutes face-to-face and non-face-to-face in the care of this patient, which includes all pre, intra, and post visit time on the date of service.      Disposition  Next appointment: 8 weeks      To do @ next RTC  ??? Hep C RNA  ??? PSA  ??? IGRA  ??? [facility]?  ??? Need to see Hepatology  ??? Shingrix    Andrew Daily, FNP-BC  [facility]ious Diseases [facility] at [facility]  [address] [place], [place] [address]    Phone: [phone]   Fax: [phone]             Subjective      Chief Complaint   HIV follow up    HPI  In addition to details in A&P above:  ?? Has cyst on face that he'd like to have excised, would like to wait a little bit on referral. Stable, no changes in size  ?? Has intermittent ankle swelling for years.  Has had cyst on right ankle, just ankles not further up.  ?? Has been using salt, plans to cut back.  ?? Use compression stockings PRN  ?? Been feeling well overall  ?? Appetite good  ?? Roommate recovered well  after CoVid  Denies any fever, chills, nausea, vomiting, rash, urinary complaints, diarrhea, constipation.  8/20-8/27 going to a beach vacation        Past Medical History:   Diagnosis Date   ??? Chronic renal insufficiency 06/12/2013   ??? COPD (chronic obstructive pulmonary disease) (CMS-HCC) 01/29/2013   ??? Emphysema of lung (CMS-HCC)    ??? Hepatitis C    ??? Human immunodeficiency virus (HIV) disease (CMS-HCC) 01/29/2013   ??? Hypercholesteremia 01/29/2013   ??? Reflux    ??? Vomiting     after eating         Social History  Background - Lives with roommate, has been socializing a little bit more    Housing - in house with roommate  School / Work & Benefits - on [employer]    Social History     Tobacco Use   ??? Smoking status: Current Every Day Smoker     Packs/day: 0.50     Years: 43.00     Pack years: 21.50     Types: Cigarettes     Start date: 01/29/1970   ??? Smokeless tobacco: Never Used   ??? Tobacco comment: 10 cigarettes/day   Substance Use Topics   ??? Alcohol use: Yes     Alcohol/week: 0.0 standard drinks     Comment: sunday ~ 2 mixed drinks (vodka)    ??? Drug use: Yes     Frequency: 2.0 times per week     Types: Marijuana     Comment: weekly use ~ 2 times week       Review of Systems  As per HPI. All others negative.      Medications and Allergies  He has a current medication list which includes the following prescription(s): albuterol, aripiprazole, atorvastatin, atovaquone, tivicay, descovy, lamotrigine, losartan, naproxen sodium, stiolto respimat, trazodone, and fluticasone propionate.    Allergies: Pollen extracts and Sulfa (sulfonamide antibiotics)      Family History  His family history includes Hepatitis in his sister; Parkinsonism in his sister; Rheumatologic disease in his sister. He was adopted.           Objective      BP 138/62  - Pulse 89  - Temp 36.5 ??C (97.7 ??F) (Temporal)  - Ht 195.6 cm (6' 5)  - Wt 97.4 kg (214 lb 12.8 oz)  - BMI 25.47 kg/m??      Const ??? WDWN, NAD, non-toxic appearance    Eyes ??? lids normal bilaterally, conjunctiva anicteric and noninjected OU   ??? PERRL    ENMT ??? normal appearance of external nose and ears, no nasal discharge   ??? OP clear    Neck ??? neck of normal appearance and trachea midline   ??? no thyromegaly, nodules, or tenderness    Lymph ??? no LAD in neck    CV ??? RRR, no m/r/g, very distant heart sounds, difficult to auscultate, likely secondary to severe COPD  ??? no peripheral edema, WWP    Resp ??? normal WOB   ??? on RA, no breathlessness with speaking, no coughing, CTAB    GI ??? normal inspection, NTND, NABS   ??? no umbilical hernia on exam    GU ??? deferred   MSK ??? no clubbing or cyanosis of hands   ??? no focal tenderness or abnormalities of joints of RUE, LUE, RLE, or LLE    Skin ??? no rashes, lesions, or ulcers of visualized skin   ??? no nodules or areas of induration  of palpated skin    Neuro ??? CNs II-XII grossly intact   ??? sensation to light touch grossly intact throughout    Psych ??? appropriate affect   ??? oriented to person, place, time [facility]    ?? Has cyst on face that he'd like to have excised, OTC  ?? Has intermittent ankle swelling for years noticies with being on his face. Has had cyst on right ankle, just ankles not further up.  ?? Has been using salt  ?? Use compression stockings PRN  ?? Been feeling well overall  ?? Appetite good  ?? Roommate recoevered well after CoVid  Denies any fever, chills, nausea, vomiting, rash, urinary complaints, diarrhea, constipation.  ??   ??   Past Medical History:   Diagnosis Date   ??? Chronic renal insufficiency 06/12/2013   ??? COPD (chronic obstructive pulmonary disease) (CMS-HCC) 01/29/2013   ??? Emphysema of lung (CMS-HCC)    ??? Hepatitis C    ??? Human immunodeficiency virus (HIV) disease (CMS-HCC) 01/29/2013   ??? Hypercholesteremia 01/29/2013   ??? Reflux    ??? Vomiting     after eating       Medications and Allergies   Reviewed and updated today. See bottom of this visit's encounter summary for details.  Current Outpatient Medications on File Prior to Visit   Medication Sig   ??? albuterol HFA 90 mcg/actuation inhaler Inhale 1 puff every six (6) hours as needed for wheezing or shortness of breath.   ??? ARIPiprazole (ABILIFY) 2 MG tablet Take 1 tablet (2 mg total) by mouth in the morning.   ??? atorvastatin (LIPITOR) 10 MG tablet Take 1 tablet (10 mg total) by mouth Oconnell.   ??? atovaquone (MEPRON) 750 mg/5 mL suspension Take 10 mL (1,500 mg total) by mouth Oconnell.   ??? dolutegravir (TIVICAY) 50 mg TABLET Take 1 tablet (50 mg total) by mouth Oconnell.   ??? emtricitabine-tenofovir alafen (DESCOVY) 200-25 mg tablet Take 1 tablet by mouth Oconnell.   ??? lamoTRIgine (LAMICTAL) 200 MG tablet Take 1 tablet (200 mg total) by mouth in the morning.   ??? losartan (COZAAR) 25 MG tablet Take 1 tablet (25 mg total) by mouth Oconnell.   ??? naproxen sodium (ALEVE) 220 MG tablet Take 220 mg by mouth Oconnell as needed for pain.   ??? tiotropium-olodateroL (STIOLTO RESPIMAT) 2.5-2.5 mcg/actuation Mist Inhale 2 puffs Oconnell.   ??? traZODone (DESYREL) 100 MG tablet Take 2 tablets (200 mg total) by mouth nightly.   ??? fluticasone (FLONASE) 50 mcg/actuation nasal spray 1 spray by Each Nare route Oconnell.     No current facility-administered medications on file prior to visit.       Allergies   Allergen Reactions   ??? Pollen Extracts      Congestion     ??? Sulfa (Sulfonamide Antibiotics) Rash       Social History  Social History     Tobacco Use   ??? Smoking status: Current Every Day Smoker     Packs/day: 0.50     Years: 43.00     Pack years: 21.50     Types: Cigarettes     Start date: 01/29/1970   ??? Smokeless tobacco: Never Used   ??? Tobacco comment: 10 cigarettes/day   Substance Use Topics   ??? Alcohol use: Yes     Alcohol/week: 0.0 standard drinks     Comment: sunday ~ 2 mixed drinks (vodka)        Review of Systems  As per  HPI. Remainder of 10 systems reviewed, negative.        Objective:      BP 138/62  - Pulse 89  - Temp 36.5 ??C (97.7 ??F) (Temporal)  - Ht 195.6 cm (6' 5)  - Wt 97.4 kg (214 lb 12.8 oz)  - BMI 25.47 kg/m??     Const looks well and attentive, alert, appropriate   Eyes sclerae anicteric, noninjected OU   ENT masked   Lymph no cervical or supraclavicular LAD   CV RRR. No murmurs. No rub or gallop. S1/S2.   Resp CTAB ant/post, normal work of breathing, initially noted occasional wheezing when auscultated initially but cleared with coughing.   GI Soft. NTND. NABS.   GU deferred   Rectal deferred   Skin no petechiae, ecchymoses or obvious rashes on clothed exam   MSK normal ROM throughout   Neuro grossly intact   Psych Appropriate affect. Eye contact good. Linear thoughts. Fluent speech.     Laboratory Data  Reviewed in Epic today, using Synopsis and Chart Review filters.    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

## 2021-05-03 NOTE — Unmapped (Signed)
[facility] Refill Coordination Note    Specialty Medication(s) to be Shipped:   Infectious Disease: Atovaquone suspension    Other medication(s) to be shipped: No additional medications requested for fill at this time     Andrew Oconnell, DOB: 06-Jul-1956  Phone: [phone] (home)       All above HIPAA information was verified with patient.     Was a Nurse, learning disability used for this call? No    Completed refill call assessment today to schedule patient's medication shipment from the [facility] [phone]).  All relevant notes have been reviewed.     Specialty medication(s) and dose(s) confirmed: Regimen is correct and unchanged.   Changes to medications: Andrew Oconnell reports no changes at this time.  Changes to insurance: No  New side effects reported not previously addressed with a pharmacist or physician: None reported  Questions for the pharmacist: No    Confirmed patient received a Conservation officer, historic buildings and a Surveyor, mining with first shipment. The patient will receive a drug information handout for each medication shipped and additional FDA Medication Guides as required.       DISEASE/MEDICATION-SPECIFIC INFORMATION        N/A    SPECIALTY MEDICATION ADHERENCE     Medication Adherence    Patient reported X missed doses in the last month: 0  Specialty Medication: atovaquone 750mg /96ml suspension  Patient is on additional specialty medications: No  Patient is on more than two specialty medications: No  Any gaps in refill history greater than 2 weeks in the last 3 months: no  Demonstrates understanding of importance of adherence: yes  Informant: patient  Reliability of informant: reliable              Were doses missed due to medication being on hold? No    atovaquone 750 mg/5 mL susp: 0 days of medicine on hand     REFERRAL TO PHARMACIST     Referral to the pharmacist: Not needed      [facility]     Shipping address confirmed in Epic.     Delivery Scheduled: Yes, Expected medication delivery date: 05/06/21.     Medication will be delivered via UPS to the prescription address in Epic WAM.    Andrew Oconnell   [facility] Specialty Technician

## 2021-05-04 MED ORDER — DESCOVY 200 MG-25 MG TABLET
ORAL_TABLET | Freq: Every day | ORAL | 5 refills | 30 days | Status: CP
Start: 2021-05-04 — End: ?
  Filled 2021-05-17: qty 30, 30d supply, fill #0

## 2021-05-04 MED ORDER — TIVICAY 50 MG TABLET
ORAL_TABLET | Freq: Every day | ORAL | 5 refills | 30.00000 days | Status: CP
Start: 2021-05-04 — End: ?
  Filled 2021-05-17: qty 30, 30d supply, fill #0

## 2021-05-05 MED FILL — ATOVAQUONE 750 MG/5 ML ORAL SUSPENSION: ORAL | 30 days supply | Qty: 300 | Fill #0

## 2021-05-06 MED FILL — STIOLTO RESPIMAT 2.5 MCG-2.5 MCG/ACTUATION SOLUTION FOR INHALATION: RESPIRATORY_TRACT | 30 days supply | Qty: 4 | Fill #2

## 2021-05-07 ENCOUNTER — Ambulatory Visit: Admit: 2021-05-07 | Discharge: 2021-05-08 | Payer: MEDICARE

## 2021-05-11 NOTE — Unmapped (Addendum)
Benefits Counselor calculated income provided by patient. Patient is ineligible to receive RW B & C services except for Caps on Charges. Patients' income is over 300% [employer]PL. Expires: 05/02/2022        HMAP/SPAP application: Application was submitted via secure email today.    [name]  [facility] Benefits Counselor  Time of Intervention-1mins

## 2021-05-12 NOTE — Unmapped (Addendum)
HMAP Summer renewal was denied due to patient being over income. Contacted patient to see if he'd received his supply of meds for this month yet per HMAP's request. Patient was concerned about being over income and isnt sure if he'll be able to afford his meds. Encouraged patient to contact his pharmacy to see what his copay was before billing HMAP. Also, told patient I would send a message to social work to see if there were other resources available. Patient isnt out of meds but hasn't received the supply for this month.        10:10- patients representative called benefits to get more information on the above call. Explained to her the patient was denied due to his income. Provided his income [phone]) as well as the requirement ($40,770). Per representative, patient was panicking and very concerned about his options. Informed her, that a referral was sent to SW regarding resources or other options available.      [name]  [facility] Benefits Counselor  Time of Intervention-71mins

## 2021-05-14 NOTE — Unmapped (Signed)
[facility] Refill Coordination Note    Specialty Medication(s) to be Shipped:   Infectious Disease: Descovy and Tivicay    Other medication(s) to be shipped: No additional medications requested for fill at this time     Andrew Oconnell, DOB: [birth year]  Phone: [phone] (home)       All above HIPAA information was verified with patient.     Was a Nurse, learning disability used for this call? No    Completed refill call assessment today to schedule patient's medication shipment from the [facility] [phone]).  All relevant notes have been reviewed.     Specialty medication(s) and dose(s) confirmed: Regimen is correct and unchanged.   Changes to medications: Leeon reports no changes at this time.  Changes to insurance: No  New side effects reported not previously addressed with a pharmacist or physician: None reported  Questions for the pharmacist: No    Confirmed patient received a Conservation officer, historic buildings and a Surveyor, mining with first shipment. The patient will receive a drug information handout for each medication shipped and additional FDA Medication Guides as required.       DISEASE/MEDICATION-SPECIFIC INFORMATION        N/A    SPECIALTY MEDICATION ADHERENCE     Medication Adherence    Patient reported X missed doses in the last month: 0  Specialty Medication: descovy  Patient is on additional specialty medications: Yes  Additional Specialty Medications: Tivicay   Patient Reported Additional Medication X Missed Doses in the Last Month: 0  Patient is on more than two specialty medications: No  Any gaps in refill history greater than 2 weeks in the last 3 months: no  Demonstrates understanding of importance of adherence: yes  Informant: patient  Reliability of informant: reliable  Provider-estimated medication adherence level: good  Patient is at risk for Non-Adherence: No              Were doses missed due to medication being on hold? No    decovy 200-25 mg: 8 days of medicine on hand tivicay 50 mg: 8 days of medicine on hand         REFERRAL TO PHARMACIST     Referral to the pharmacist: Not needed      [facility]     Shipping address confirmed in Epic.     Delivery Scheduled: Yes, Expected medication delivery date: 08/02.     Medication will be delivered via UPS to the prescription address in Epic WAM.    Antonietta Barcelona   [facility] Specialty Technician

## 2021-05-26 ENCOUNTER — Ambulatory Visit: Admit: 2021-05-26 | Discharge: 2021-05-27 | Payer: MEDICARE

## 2021-05-26 DIAGNOSIS — Z136 Encounter for screening for cardiovascular disorders: Secondary | ICD-10-CM | POA: Diagnosis not present

## 2021-05-26 DIAGNOSIS — F17210 Nicotine dependence, cigarettes, uncomplicated: Secondary | ICD-10-CM | POA: Diagnosis not present

## 2021-06-03 MED FILL — ATORVASTATIN 10 MG TABLET: ORAL | 30 days supply | Qty: 30 | Fill #3

## 2021-06-03 MED FILL — TRAZODONE 100 MG TABLET: ORAL | 90 days supply | Qty: 180 | Fill #1

## 2021-06-03 MED FILL — LOSARTAN 25 MG TABLET: ORAL | 30 days supply | Qty: 30 | Fill #3

## 2021-06-03 MED FILL — ATOVAQUONE 750 MG/5 ML ORAL SUSPENSION: ORAL | 30 days supply | Qty: 300 | Fill #1

## 2021-06-03 MED FILL — STIOLTO RESPIMAT 2.5 MCG-2.5 MCG/ACTUATION SOLUTION FOR INHALATION: RESPIRATORY_TRACT | 30 days supply | Qty: 4 | Fill #3

## 2021-06-03 NOTE — Unmapped (Signed)
[facility] Refill Coordination Note    Specialty Medication(s) to be Shipped:   Infectious Disease: Atovaquone suspension    Other medication(s) to be shipped: atorvastatin  Losartan  stiolto respimat       DEVAUGHN SAVANT, DOB: 08-09-1956  Phone: [phone] (home)       All above HIPAA information was verified with patient.     Was a Nurse, learning disability used for this call? No    Completed refill call assessment today to schedule patient's medication shipment from the [facility] [phone]).  All relevant notes have been reviewed.     Specialty medication(s) and dose(s) confirmed: Regimen is correct and unchanged.   Changes to medications: Zaid reports no changes at this time.  Changes to insurance: No  New side effects reported not previously addressed with a pharmacist or physician: None reported  Questions for the pharmacist: No    Confirmed patient received a Conservation officer, historic buildings and a Surveyor, mining with first shipment. The patient will receive a drug information handout for each medication shipped and additional FDA Medication Guides as required.       DISEASE/MEDICATION-SPECIFIC INFORMATION        N/A    SPECIALTY MEDICATION ADHERENCE     Medication Adherence    Patient reported X missed doses in the last month: 0  Specialty Medication: atovaquone susp              Were doses missed due to medication being on hold? No    Atovaquone susp  : 2 days of medicine on hand       REFERRAL TO PHARMACIST     Referral to the pharmacist: Not needed      [facility]     Shipping address confirmed in Epic.     Delivery Scheduled: Yes, Expected medication delivery date: 8/19.     Medication will be delivered via UPS to the prescription address in Epic WAM.    Westley Gambles   [facility] Specialty Technician

## 2021-06-14 MED FILL — DESCOVY 200 MG-25 MG TABLET: ORAL | 30 days supply | Qty: 30 | Fill #1

## 2021-06-14 MED FILL — TIVICAY 50 MG TABLET: ORAL | 30 days supply | Qty: 30 | Fill #1

## 2021-06-14 MED FILL — ARIPIPRAZOLE 2 MG TABLET: ORAL | 90 days supply | Qty: 90 | Fill #1

## 2021-06-14 NOTE — Unmapped (Signed)
[facility] Clinical Assessment & Refill Coordination Note    Andrew Oconnell, DOB: 02/05/1956  Phone: [phone] (home)     All above HIPAA information was verified with patient.     Was a Nurse, learning disability used for this call? No    Specialty Medication(s):   Infectious Disease: Atovaquone suspension, Descovy and Tivicay     Current Outpatient Medications   Medication Sig Dispense Refill   ??? albuterol HFA 90 mcg/actuation inhaler Inhale 1 puff every six (6) hours as needed for wheezing or shortness of breath. 8.5 g 0   ??? ARIPiprazole (ABILIFY) 2 MG tablet Take 1 tablet (2 mg total) by mouth in the morning. 90 tablet 1   ??? atorvastatin (LIPITOR) 10 MG tablet Take 1 tablet (10 mg total) by mouth daily. 30 tablet 5   ??? atovaquone (MEPRON) 750 mg/5 mL suspension Take 10 mL (1,500 mg total) by mouth daily. 300 mL 5   ??? dolutegravir (TIVICAY) 50 mg TABLET Take 1 tablet (50 mg total) by mouth daily. 30 tablet 5   ??? emtricitabine-tenofovir alafen (DESCOVY) 200-25 mg tablet Take 1 tablet by mouth daily. 30 tablet 5   ??? fluticasone (FLONASE) 50 mcg/actuation nasal spray 1 spray by Each Nare route daily. 16 g 0   ??? lamoTRIgine (LAMICTAL) 200 MG tablet Take 1 tablet (200 mg total) by mouth in the morning. 90 tablet 1   ??? losartan (COZAAR) 25 MG tablet Take 1 tablet (25 mg total) by mouth daily. 30 tablet 5   ??? naproxen sodium (ALEVE) 220 MG tablet Take 220 mg by mouth daily as needed for pain.     ??? tiotropium-olodateroL (STIOLTO RESPIMAT) 2.5-2.5 mcg/actuation Mist Inhale 2 puffs daily. 4 g 11   ??? traZODone (DESYREL) 100 MG tablet Take 2 tablets (200 mg total) by mouth nightly. 180 tablet 2     No current facility-administered medications for this visit.        Changes to medications: [name] reports no changes at this time.    Allergies   Allergen Reactions   ??? Pollen Extracts      Congestion     ??? Sulfa (Sulfonamide Antibiotics) Rash       Changes to allergies: No    SPECIALTY MEDICATION ADHERENCE atovaquone 750 mg/45mL: approximately 18 days of medicine on hand   Descovy 200-25 mg: 8 days of medicine on hand   Tivicay 50 mg: 8 days of medicine on hand       Medication Adherence    Patient reported X missed doses in the last month: 0  Specialty Medication: atovaquone 750mg /75mL  Patient is on additional specialty medications: Yes  Additional Specialty Medications: Descovy 200-25mg   Patient Reported Additional Medication X Missed Doses in the Last Month: 0  Patient is on more than two specialty medications: Yes  Specialty Medication: Tivicay 50mg   Patient Reported Additional Medication X Missed Doses in the Last Month: 0  Demonstrates understanding of importance of adherence: yes  Informant: patient  Provider-estimated medication adherence level: good  Patient is at risk for Non-Adherence: No          Specialty medication(s) dose(s) confirmed: Regimen is correct and unchanged.     Are there any concerns with adherence? No    Adherence counseling provided? Not needed    CLINICAL MANAGEMENT AND INTERVENTION      Clinical Benefit Assessment:    Do you feel the medicine is effective or helping your condition? Yes  HIV ASSOCIATED LABS:     Lab Results   Component Value Date/Time    HIVRS Not Detected 05/03/2021 11:24 AM    HIVRS Detected (A) 03/02/2021 11:32 AM    HIVRS Detected (A) 01/20/2021 12:16 PM    HIVRS <40 08/24/2017 12:00 AM    HIVRS Not Detected 07/29/2014 12:50 PM    HIVRS Not Detected 02/18/2014 11:42 AM    HIVCP 258 (H) 03/02/2021 11:32 AM    HIVCP 61,184 (H) 01/20/2021 12:16 PM    HIVCP <40 copies/mL 03/25/2020 12:00 AM    HIVCP <40 11/29/2016 12:00 AM    HIVCP <40 12/01/2015 12:00 AM    RCD4 36.1 03/25/2020 12:00 AM    RCD4 31.5 06/28/2016 12:00 AM    RCD4 29.6 12/01/2015 12:00 AM    ACD4 385 (L) 05/03/2021 11:24 AM    ACD4 168 (L) 01/20/2021 12:16 PM    ACD4 433 (A) 03/25/2020 12:00 AM    ACD4 593 10/22/2019 09:03 AM    ACD4 630 06/28/2016 12:00 AM    ACD4 710 12/01/2015 12:00 AM Clinical Benefit counseling provided? Labs from 05/03/21 show evidence of clinical benefit    Adverse Effects Assessment:    Are you experiencing any side effects? No    Are you experiencing difficulty administering your medicine? No    Quality of Life Assessment:    How many days over the past month did your HIV  keep you from your normal activities? For example, brushing your teeth or getting up in the morning. 0    Have you discussed this with your provider? Not needed    Acute Infection Status:    Acute infections noted within Epic:  No active infections  Patient reported infection: None    Therapy Appropriateness:    Is therapy appropriate? Yes, therapy is appropriate and should be continued    DISEASE/MEDICATION-SPECIFIC INFORMATION      N/A    PATIENT SPECIFIC NEEDS     - Does the patient have any physical, cognitive, or cultural barriers? No    - Is the patient high risk? No    - Does the patient require a Care Management Plan? No     - Does the patient require physician intervention or other additional services (i.e. nutrition, smoking cessation, social work)? No      SHIPPING     Specialty Medication(s) to be Shipped:   Infectious Disease: Descovy and Tivicay    Other medication(s) to be shipped: aripiprazole 2mg      Changes to insurance: No    Delivery Scheduled: Yes, Expected medication delivery date: 06/15/21.     Medication will be delivered via UPS to the confirmed prescription' address in [place].    The patient will receive a drug information handout for each medication shipped and additional FDA Medication Guides as required.  Verified that patient has previously received a Conservation officer, historic buildings and a Surveyor, mining.    The patient or caregiver noted above participated in the development of this care plan and knows that they can request review of or adjustments to the care plan at any time.      All of the patient's questions and concerns have been addressed.    [name]   [facility] Specialty Pharmacist

## 2021-06-18 NOTE — Unmapped (Addendum)
Spoke with patient and patient sister, Kemp Gomes and explained the situation of HMAP denial. Informed the patient can look into Senior centers or call Medicare to speak about a different plan. Will contact SW for other resources. Let patient and sister know Benefits will call on Tuesday, 06/22/2021.     Patient sister requested to give her a call at [phone].    Madinah Cathcart-Rowe  Benefits Counselor  Time of Intervention: 15 mins

## 2021-06-25 NOTE — Unmapped (Signed)
[facility] Refill Coordination Note    Specialty Medication(s) to be Shipped:   Infectious Disease: Atovaquone suspension    Other medication(s) to be shipped: atorvastatin, lamotrigine, losartan, stiolto     Andrew Oconnell, DOB: 1956-09-02  Phone: [phone] (home)       All above HIPAA information was verified with patient.     Was a Nurse, learning disability used for this call? No    Completed refill call assessment today to schedule patient's medication shipment from the [facility] [phone]).  All relevant notes have been reviewed.     Specialty medication(s) and dose(s) confirmed: Regimen is correct and unchanged.   Changes to medications: Andrew Oconnell reports no changes at this time.  Changes to insurance: No  New side effects reported not previously addressed with a pharmacist or physician: None reported  Questions for the pharmacist: No    Confirmed patient received a Conservation officer, historic buildings and a Surveyor, mining with first shipment. The patient will receive a drug information handout for each medication shipped and additional FDA Medication Guides as required.       DISEASE/MEDICATION-SPECIFIC INFORMATION        N/A    SPECIALTY MEDICATION ADHERENCE     Medication Adherence    Patient reported X missed doses in the last month: 1  Specialty Medication: atovaquone 750 mg/5 mL  Patient is on additional specialty medications: No              Were doses missed due to medication being on hold? No    atovaquone 750/5  mg/ml: 5-6 days of medicine on hand        REFERRAL TO PHARMACIST     Referral to the pharmacist: Not needed      [facility]     Shipping address confirmed in Epic.     Delivery Scheduled: Yes, Expected medication delivery date: 06/29/21.     Medication will be delivered via UPS to the prescription address in Epic WAM.    Unk Lightning   [facility] Specialty Technician

## 2021-06-28 MED FILL — ATORVASTATIN 10 MG TABLET: ORAL | 30 days supply | Qty: 30 | Fill #4

## 2021-06-28 MED FILL — LAMOTRIGINE 200 MG TABLET: ORAL | 90 days supply | Qty: 90 | Fill #1

## 2021-06-28 MED FILL — STIOLTO RESPIMAT 2.5 MCG-2.5 MCG/ACTUATION SOLUTION FOR INHALATION: RESPIRATORY_TRACT | 30 days supply | Qty: 4 | Fill #4

## 2021-06-28 MED FILL — LOSARTAN 25 MG TABLET: ORAL | 30 days supply | Qty: 30 | Fill #4

## 2021-06-28 MED FILL — ATOVAQUONE 750 MG/5 ML ORAL SUSPENSION: ORAL | 30 days supply | Qty: 300 | Fill #2

## 2021-06-30 NOTE — Unmapped (Signed)
COMPLEX CASE MANAGEMENT   Brief Note    This patient has been reviewed for Complex Case Management services and is not eligible at this time due to: Insurance no longer Hotel manager . To have this patient reevaluated for Complex Case Management please place AMB Referral for Case Management to the Personal Health Advocate Department.

## 2021-07-08 NOTE — Unmapped (Signed)
[facility] Refill Coordination Note    Specialty Medication(s) to be Shipped:   Infectious Disease: Descovy and Tivicay    Other medication(s) to be shipped: No additional medications requested for fill at this time     Andrew Oconnell, DOB: 1955/11/20  Phone: [phone] (home)       All above HIPAA information was verified with patient.     Was a Nurse, learning disability used for this call? No    Completed refill call assessment today to schedule patient's medication shipment from the [facility] [phone]).  All relevant notes have been reviewed.     Specialty medication(s) and dose(s) confirmed: Regimen is correct and unchanged.   Changes to medications: [name] reports no changes at this time.  Changes to insurance: No  New side effects reported not previously addressed with a pharmacist or physician: None reported  Questions for the pharmacist: No    Confirmed patient received a Conservation officer, historic buildings and a Surveyor, mining with first shipment. The patient will receive a drug information handout for each medication shipped and additional FDA Medication Guides as required.       DISEASE/MEDICATION-SPECIFIC INFORMATION        N/A    SPECIALTY MEDICATION ADHERENCE     Medication Adherence    Patient reported X missed doses in the last month: 0  Specialty Medication: tivicay  Patient is on additional specialty medications: Yes  Additional Specialty Medications: descovy  Patient Reported Additional Medication X Missed Doses in the Last Month: 0              Were doses missed due to medication being on hold? No    Unable to confirm quantity on hand    REFERRAL TO PHARMACIST     Referral to the pharmacist: Not needed      [facility]     Shipping address confirmed in Epic.     Delivery Scheduled: Yes, Expected medication delivery date: 9/30.     Medication will be delivered via UPS to the prescription address in Epic WAM.    Westley Gambles   [facility] Specialty Technician

## 2021-07-12 ENCOUNTER — Ambulatory Visit: Admit: 2021-07-12 | Discharge: 2021-07-12 | Payer: MEDICARE

## 2021-07-12 ENCOUNTER — Ambulatory Visit: Admit: 2021-07-12 | Discharge: 2021-07-12 | Payer: MEDICARE | Attending: Family | Primary: Family

## 2021-07-12 DIAGNOSIS — Z113 Encounter for screening for infections with a predominantly sexual mode of transmission: Principal | ICD-10-CM

## 2021-07-12 DIAGNOSIS — J329 Chronic sinusitis, unspecified: Principal | ICD-10-CM

## 2021-07-12 DIAGNOSIS — R799 Abnormal finding of blood chemistry, unspecified: Principal | ICD-10-CM

## 2021-07-12 DIAGNOSIS — J310 Chronic rhinitis: Principal | ICD-10-CM

## 2021-07-12 DIAGNOSIS — R0982 Postnasal drip: Secondary | ICD-10-CM | POA: Diagnosis not present

## 2021-07-12 DIAGNOSIS — N189 Chronic kidney disease, unspecified: Secondary | ICD-10-CM | POA: Diagnosis not present

## 2021-07-12 DIAGNOSIS — I129 Hypertensive chronic kidney disease with stage 1 through stage 4 chronic kidney disease, or unspecified chronic kidney disease: Secondary | ICD-10-CM | POA: Diagnosis not present

## 2021-07-12 DIAGNOSIS — Z572 Occupational exposure to dust: Secondary | ICD-10-CM | POA: Diagnosis not present

## 2021-07-12 DIAGNOSIS — E7800 Pure hypercholesterolemia, unspecified: Secondary | ICD-10-CM | POA: Diagnosis not present

## 2021-07-12 DIAGNOSIS — Z882 Allergy status to sulfonamides status: Secondary | ICD-10-CM | POA: Diagnosis not present

## 2021-07-12 DIAGNOSIS — R0981 Nasal congestion: Secondary | ICD-10-CM | POA: Diagnosis not present

## 2021-07-12 DIAGNOSIS — J439 Emphysema, unspecified: Secondary | ICD-10-CM | POA: Diagnosis not present

## 2021-07-12 DIAGNOSIS — Z575 Occupational exposure to toxic agents in other industries: Secondary | ICD-10-CM | POA: Diagnosis not present

## 2021-07-12 DIAGNOSIS — Z23 Encounter for immunization: Secondary | ICD-10-CM | POA: Diagnosis not present

## 2021-07-12 DIAGNOSIS — B182 Chronic viral hepatitis C: Secondary | ICD-10-CM | POA: Diagnosis not present

## 2021-07-12 DIAGNOSIS — B20 Human immunodeficiency virus [HIV] disease: Secondary | ICD-10-CM | POA: Diagnosis not present

## 2021-07-12 DIAGNOSIS — Z122 Encounter for screening for malignant neoplasm of respiratory organs: Secondary | ICD-10-CM | POA: Diagnosis not present

## 2021-07-12 DIAGNOSIS — F17210 Nicotine dependence, cigarettes, uncomplicated: Secondary | ICD-10-CM | POA: Diagnosis not present

## 2021-07-12 DIAGNOSIS — Z0000 Encounter for general adult medical examination without abnormal findings: Principal | ICD-10-CM

## 2021-07-12 DIAGNOSIS — Z125 Encounter for screening for malignant neoplasm of prostate: Principal | ICD-10-CM

## 2021-07-12 LAB — BASIC METABOLIC PANEL
ANION GAP: 6 mmol/L (ref 5–14)
BLOOD UREA NITROGEN: 17 mg/dL (ref 9–23)
BUN / CREAT RATIO: 17
CALCIUM: 10.2 mg/dL (ref 8.7–10.4)
CHLORIDE: 101 mmol/L (ref 98–107)
CO2: 26.8 mmol/L (ref 20.0–31.0)
CREATININE: 1 mg/dL
EGFR CKD-EPI (2021) MALE: 82 mL/min/{1.73_m2} (ref >=60)
GLUCOSE RANDOM: 118 mg/dL (ref 70–179)
POTASSIUM: 4.1 mmol/L (ref 3.4–4.8)
SODIUM: 134 mmol/L — ABNORMAL LOW (ref 135–145)

## 2021-07-12 LAB — CBC W/ AUTO DIFF
BASOPHILS ABSOLUTE COUNT: 0.1 10*9/L (ref 0.0–0.1)
BASOPHILS RELATIVE PERCENT: 0.8 %
EOSINOPHILS ABSOLUTE COUNT: 0.1 10*9/L (ref 0.0–0.5)
EOSINOPHILS RELATIVE PERCENT: 1.3 %
HEMATOCRIT: 40.5 % (ref 39.0–48.0)
HEMOGLOBIN: 14.3 g/dL (ref 12.9–16.5)
LYMPHOCYTES ABSOLUTE COUNT: 0.9 10*9/L — ABNORMAL LOW (ref 1.1–3.6)
LYMPHOCYTES RELATIVE PERCENT: 12.1 %
MEAN CORPUSCULAR HEMOGLOBIN CONC: 35.2 g/dL (ref 32.0–36.0)
MEAN CORPUSCULAR HEMOGLOBIN: 37 pg — ABNORMAL HIGH (ref 25.9–32.4)
MEAN CORPUSCULAR VOLUME: 105 fL — ABNORMAL HIGH (ref 77.6–95.7)
MEAN PLATELET VOLUME: 7.7 fL (ref 6.8–10.7)
MONOCYTES ABSOLUTE COUNT: 0.4 10*9/L (ref 0.3–0.8)
MONOCYTES RELATIVE PERCENT: 5.9 %
NEUTROPHILS ABSOLUTE COUNT: 5.8 10*9/L (ref 1.8–7.8)
NEUTROPHILS RELATIVE PERCENT: 79.9 %
PLATELET COUNT: 146 10*9/L — ABNORMAL LOW (ref 150–450)
RED BLOOD CELL COUNT: 3.86 10*12/L — ABNORMAL LOW (ref 4.26–5.60)
RED CELL DISTRIBUTION WIDTH: 12.3 % (ref 12.2–15.2)
WBC ADJUSTED: 7.3 10*9/L (ref 3.6–11.2)

## 2021-07-12 LAB — PSA: PROSTATE SPECIFIC ANTIGEN: 0.22 ng/mL (ref 0.00–4.00)

## 2021-07-12 LAB — HEMOGLOBIN A1C
ESTIMATED AVERAGE GLUCOSE: 91 mg/dL
HEMOGLOBIN A1C: 4.8 % (ref 4.8–5.6)

## 2021-07-12 LAB — BILIRUBIN, TOTAL: BILIRUBIN TOTAL: 0.7 mg/dL (ref 0.3–1.2)

## 2021-07-12 LAB — LYMPH MARKER LIMITED,FLOW
ABSOLUTE CD3 CNT: 720 {cells}/uL — ABNORMAL LOW (ref 915–3400)
ABSOLUTE CD4 CNT: 288 {cells}/uL — ABNORMAL LOW (ref 510–2320)
ABSOLUTE CD8 CNT: 441 {cells}/uL (ref 180–1520)
CD3% (T CELLS): 80 % (ref 61–86)
CD4% (T HELPER): 32 % — ABNORMAL LOW (ref 34–58)
CD4:CD8 RATIO: 0.7 — ABNORMAL LOW (ref 0.9–4.8)
CD8% T SUPPRESR: 49 % — ABNORMAL HIGH (ref 12–38)

## 2021-07-12 LAB — AST: AST (SGOT): 23 U/L (ref <=34)

## 2021-07-12 LAB — ALT: ALT (SGPT): 15 U/L (ref 10–49)

## 2021-07-12 NOTE — Unmapped (Signed)
HIV-PULMONARY CLINIC VISIT:    Patient: Andrew Oconnell(04/02/1956)   Reason for visit: COPD    HISTORY OF PRESENT ILLNESS:     Mr. Andrew Oconnell is a 65 year old gentleman with a PMH as below including HIV on ART, tobacco use and COPD.     HISTORY OF PRESENT ILLNESS (06/2020):   Mr. Andrew Oconnell reports that he was first diagnosed with COPD in 2005. He had previously been seeing a pulmonologist in [place] and has been on Advair with prn albuterol for a few years though he ran out of the Advair a few months ago. He reports that he has had some dyspnea on exertion for the past few months and reports an mMRC score of 2 today. He uses his rescue inhaler 2-3 times per week. He denies any cough but does endorse morning sputum production that is typically clear and never has blood. He denies any wheezing or chest tightness. He has chronic rhinosinusitis, for which he follows with ENT - currently managed with sinus rinses and oral antihistamines. He denies any fevers or chills and reports occasional mild night sweats that have been stable for years. He reports weight gain of 3 pounds in the last month. He denies any lower extremity swelling. He has not required antibiotics or oral steroids in the last 2-3 years for a COPD exacerbation.    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 He reports that his respiratory symptoms remain very well controlled. His mMRC score today is 1. He reports minimal cough and no sputum production. He denies any wheezing, chest pain, chest tightness, fevers or chills. He has been fully compliant with his Stiolto and uses his albuterol rescue inhaler 1-2 times a week. He continues to have his chronic rhinosinusitis issues and was never scheduled for an appointment with ENT. He currently takes Allegra as needed. He no longer uses Flonase because it did not help at all. He tried sinus irrigation previously but could not tolerate it.      Medication Adherence: Adherent all of the time.  Inhaler Technique: Using inhaler properly.    Pulmonary medications: Stiolto, prn Albuterol    HIV status:  Date: CD4 Viral load   10/2018 543 ND   10/2019 593 ND   03/2020  <40   04/2021 385 ND     Current regimen: Tivicay and Descovy    REVIEW OF SYSTEMS: All systems were reviewed and found to be negative except as above in the HPI.    PAST MEDICAL HISTORY:     MEDICAL/SURGICAL HISTORY:  Past Medical History:   Diagnosis Date   ??? Chronic renal insufficiency 06/12/2013   ??? COPD (chronic obstructive pulmonary disease) (CMS-HCC) 01/29/2013   ??? Emphysema of lung (CMS-HCC)    ??? Hepatitis C    ??? Human immunodeficiency virus (HIV) disease (CMS-HCC) 01/29/2013   ??? Hypercholesteremia 01/29/2013   ??? Reflux    ??? Vomiting     after eating     Past Surgical History:   Procedure Laterality Date   ??? BACK SURGERY N/A 1990   ??? LIGATION / DIVISION SAPHENOUS VEIN Bilateral     90s   ??? PR COLSC FLX W/RMVL OF TUMOR POLYP LESION SNARE TQ N/A 05/08/2015    Procedure: COLONOSCOPY FLEX; W/REMOV TUMOR/LES BY SNARE;  Surgeon: Andrew Spray, MD;  [facility]: [facility];  Service: Gastroenterology   ??? PR  ESOPHAGEAL MOTILITY STUDY, MANOMETRY N/A 06/04/2014    Procedure: ESOPHAGEAL MOTILITY STUDY W/INT & REP;  Surgeon: Nurse-Based Andrew Oconnell;  Location: [facility];  Service: Gastroenterology   ??? PR REPAIR BICEPS LONG TENDON Left 03/11/2016    Procedure: TENODESIS LONG TENDON BICEPS;  Surgeon: Andrew Rand, MD;  Location: ASC OR [facility];  Service: Orthopedics   ??? PR SHLDR ARTHROSCOP,SURG,W/ROTAT CUFF REPR Left 03/11/2016    Procedure: ARTHROSCOPY, SHOULDER, SURGICAL; WITH ROTATOR CUFF REPAIR;  Surgeon: Andrew Rand, MD;  Location: ASC OR [facility];  Service: Orthopedics   ??? PR SURGICAL ARTHROSCOPY SHOULDER XTNSV DBRDMT 3+ Left 03/11/2016    Procedure: ARTHROSCOPY SHOULDER SURG; DEBRID EXTEN;  Surgeon: Andrew Rand, MD;  Location: ASC OR [facility];  Service: Orthopedics   ??? PR UPPER GI ENDOSCOPY,BIOPSY N/A 05/20/2014    Procedure: UGI ENDOSCOPY; WITH BIOPSY, SINGLE OR MULTIPLE;  Surgeon: Andrew Romberg, MD;  [facility]: [facility];  Service: Gastroenterology   ??? PR UPPER GI ENDOSCOPY,BIOPSY N/A 10/12/2018    Procedure: UGI ENDOSCOPY; WITH BIOPSY, SINGLE OR MULTIPLE;  Surgeon: Andrew Pupa, MD;  [facility]: [facility];  Service: Gastroenterology   ??? PR UPPER GI ENDOSCOPY,DIAGNOSIS N/A 11/06/2015    Procedure: UGI ENDO, INCLUDE ESOPHAGUS, STOMACH, & DUODENUM &/OR JEJUNUM; DX W/WO COLLECTION SPECIMN, BY BRUSH OR WASH;  Surgeon: Andrew Ravens, MD;  Location: [facility];  Service: Gastroenterology   ??? PR UPPER GI ENDOSCOPY,DIAGNOSIS N/A 01/31/2020    Procedure: UGI ENDO, INCLUDE ESOPHAGUS, STOMACH, & DUODENUM &/OR JEJUNUM; DX W/WO COLLECTION SPECIMN, BY BRUSH OR WASH;  Surgeon: Andrew Paras, MD;  Location: GI [facility]URES [facility];  Service: Gastroenterology       SOCIAL AND FAMILY HISTORY:   Social history: He lives at home with a friend and they have a Development worker, international aid and cat. He is currently retired but previously worked for [employer]. 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 He is a current smoker and started at the age of 43. He has smoked on average 0.5 packs per day (current 25 pack-year history). He also reports inhaled cannabis use a few times a week. Currently smoking 0.5 pack per day. He is currently using Cannabis 2-3 times a week.     Family History: Adopted, no known history.    MEDICATIONS AND ALLERGIES:     CURRENT MEDICATIONS:  Outpatient Encounter Medications as of 07/12/2021   Medication Sig Dispense Refill   ??? albuterol HFA 90 mcg/actuation inhaler Inhale 1 puff every six (6) hours as needed for wheezing or shortness of breath. 8.5 g 0   ??? ARIPiprazole (ABILIFY) 2 MG tablet Take 1 tablet (2 mg total) by mouth in the morning. 90 tablet 1   ??? atorvastatin (LIPITOR) 10 MG tablet Take 1 tablet (10 mg total) by mouth daily. 30 tablet 5   ??? atovaquone (MEPRON) 750 mg/5 mL suspension Take 10 mL (1,500 mg total) by mouth daily. 300 mL 5   ??? dolutegravir (TIVICAY) 50 mg TABLET Take 1 tablet (50 mg total) by mouth daily. 30 tablet 5   ??? emtricitabine-tenofovir alafen (DESCOVY) 200-25 mg tablet Take 1 tablet by mouth daily. 30 tablet 5   ??? lamoTRIgine (LAMICTAL) 200 MG tablet Take 1 tablet (200 mg total) by mouth in the morning. 90 tablet 1   ??? losartan (COZAAR) 25 MG tablet Take 1 tablet (25 mg total) by mouth daily. 30 tablet 5   ???  tiotropium-olodateroL (STIOLTO RESPIMAT) 2.5-2.5 mcg/actuation Mist Inhale 2 puffs daily. 4 g 11   ??? traZODone (DESYREL) 100 MG tablet Take 2 tablets (200 mg total) by mouth nightly. 180 tablet 2   ??? fluticasone (FLONASE) 50 mcg/actuation nasal Oconnell 1 Oconnell by Each Nare route daily. 16 g 0   ??? naproxen sodium (ALEVE) 220 MG tablet Take 220 mg by mouth daily as needed for pain. (Patient not taking: Reported on 07/12/2021)       No facility-administered encounter medications on file as of 07/12/2021.       ALLERGIES:  Allergies as of 07/12/2021 - Reviewed 07/12/2021   Allergen Reaction Noted   ??? Pollen extracts  03/09/2017   ??? Sulfa (sulfonamide antibiotics) Rash 01/29/2013       PHYSICAL EXAM:     Vitals:    07/12/21 0823   BP: 148/70   Pulse: 95   Resp: 16   Temp: 37.1 ??C (98.7 ??F)   TempSrc: Oral   SpO2: 96%   Weight: 95.6 kg (210 lb 12.8 oz)   Height: 195.6 cm (6' 5)    Body mass index is 25 kg/m??.    GENERAL: well nourished, cooperative, no acute distress  EYES: anicteric, noninjected, EOMi  HEENT: Neck supple, trachea midline, moist mucus membranes, oropharynx without lesions or thrush, L nare with significantly more narrow passage compared to R  LYMPH: no cervical, submandibular, or supraclavicular lymphadenopathy  CV: Regular rate, normal rhythm, no murmurs/rubs/gallops  PULM: Clear to auscultation bilaterally. No dullness to percussion. Normal excursion. Normal work of breathing.   ABD: Soft, nontender, nondistended. Normoactive bowel sounds.   EXTREMITIES: No digital clubbing. No edema.  SKIN: No rashes, lesions, or skin breakdown. Warm and well perfused.  NEURO: No focal neurologic deficits. Moves all extremities and follows commands.   PSYCH: Well groomed, appropriate mood and affect, good eye contact.       ANCILLARY DATA:     All imaging and labs were reviewed personally.    Date: FVC (% Pred) FEV1 (% Pred)  Pre-BD FEV1 (%Pred)  Post- BD FEV1/FVC DLCO (% Pred) VC (% Pred) TLC (% Pred) RV (%Pred)   August 2012 6.34 (107%) 3.10 (69%) 3.29 (73%) 49 (76) 54%      January 2022 6.49 (117%) 2.91 (70%) 2.92 (71%) 45 (63) 79%        CT LCS (11/2019): Non-calcified RML 0.4 cm, Lung-RADS 2.  CT LCS (12/2020): 0.4 cm RML nodule unchanged. New 0.3 LUL nodule. Lung-RADS 2.    CT sinus (11/2019): Moderate degree of mucosal thickening in the ethmoidal sinuses with mild mucosal thickening seen in the rest of the sinuses. Small right maxillary sinus noted.   IgE (06/2020): 117      ASSESSMENT AND PLAN:     Mr. Hann is a 65 year old gentleman with a PMH as below including HIV on ART, tobacco use and group IIA COPD.     His mMRC score indicates well-controlled disease on Stiolto with occasional PRN albuterol usage. He does continue to report significant sinus congestion and post-nasal drip. I recommended that he increase his oral antihistamine use to every day, regardless of symptoms to see if control improved. He has not had any benefit with intranasal steroid administration in the past and has not tolerated sinus irrigation. Given his CT sinus findings, I have placed another referral to ENT.      He is up to date on his TDaP, pneumonia and COVID vaccines including booster. We will  provide his influenza vaccine today.    He is engaged in lung cancer screening and his next CT is due in March 2023, which I have ordered today..    We again discussed the importance of tobacco cessation particularly on preventing continued lung function decline but he is not interested today. I provided information on the Tobacco Treatment Program in the HIV clinic for him to consider in the future.     I will have him follow up in 6 months.    I personally spent 31 minutes face-to-face and non-face-to-face in the care of this patient, which includes all pre, intra, and post visit time on the date of service.

## 2021-07-12 NOTE — Unmapped (Signed)
Social Work Health Education/Risk Reduction Note    Duration of Intervention: 15 minutes    REASON/TYPE OF CONTACT: Face to Face - In Person    ASSESSMENT: Provider states that pt is interested in changing his Medicare plan because his ARVs aren't as covered as he needs and he is just slightly over income for RW/HMAP/SPAP. He also needs resources for food pantries.     INTERVENTION:  SW provided education on health care coverage options. Gave pt information for local [facility] counselor to help him find a new Medicare plan during open enrollment (Oct 15-Dec 7). Told pt to call 211 for local food pantries on an as needed basis.     [facility]  [address]      [place]  [address]  [phone] ext. [phone]        Provider later informed SW that he has been struggling to get into primary care at [facility]. She states that pt is told to call back the next month. SW sent a message to [employer] case manager Laurette Schimke in pts area and asked if she knew they were understaffed or any other reason they may not be able to see pt.     Raynelle Fanning responded stating she can schedule pt. SW gave her phone number to pt [phone].     PLAN:  Pt will contact [facility] for Medicare, 211 for food pantry, and Laurette Schimke for primary care scheduling.     Bradly Bienenstock, LCSW, CHES  [facility] ID Clinic Lead Social Worker

## 2021-07-12 NOTE — Unmapped (Addendum)
INFECTIOUS DISEASES CLINIC  [address]  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 Current regimen: Descovy (FTC/TAF) and dolutegravir  Misses doses of ARVs rarely Denies any missed doses since last visit.    Med access through insurance  CD4 count 300's today but continue with Atovaquone for now, especially in light of recent presumptive PJP treatment, if CD4 continues to be >200, then can stop atovaquone  Discussed ARV adherence, continuing prophylaxis for OI and taking ARVs with food    Lab Results   Component Value Date    ACD4 385 (L) 05/03/2021    CD4 35 05/03/2021    HIVRS Not Detected 05/03/2021    HIVCP 258 (H) 03/02/2021     ??? CD4, HIV RNA, and safety labs (full return panel)  ??? Continue current therapy. On BIktarvy  ??? Discussed importance of ARV adherence      Recent presumptive treatment for PJP - acute, resolved  Sore throat, cough, chills, night sweats, decreased appetite, has really dry mouth x 4 weeks   Took full course of Atovaquone 750mg  BID x 21 days and now taking 1500mg  daily for OI prophylaxis.  Has better appetite, put on 3#. Has some wheezing at night but has been without Stiolto and albuterol.   Roommate diagnosed with CoVid at same time as his URI symptoms(03/01/21), patient tested negative for CoVid multiple times  ?? Taking Descovy/Tivicay consistently  ?? Can stop atovaquone if CD4 >200.      Chronic hepatitis C (RAF-HCC)/ HCC screening - chronic, stable  Genotype 1a, Grade 1, stage II disease by liver biopsy 01/31/2003  Previously treated in 07/28/2003 to 01/20/2004 at [facility] requiring early discontinuation secondary to significant side effects including what was thought to be interstitial pneumonitis. While on therapy required addition of both Neupogen and Procrit secondary to medication-induced cytopenias.  Treated again in 02/28/15 with Sofosbuvir and daclastasvir x 12 weeks   Followed by Hepatology, Dr. Arnette Felts FNP, last seen 11/25/19  12/02/2019 MRI of abdomen: Cirrhotic hepatic morphology with mild splenomegaly (14.3cm). No ascites. No suspicious hepatic lesions.  ?? 07/31/2018 Hep C RNA negative  ?? Obtain Hep C RNA today  ?? Continue follow up with Hepatology, now overdue. Stressed importance with patient to call for appointment.      Aortic root dilation (RAF-HCC) - chronic  Previously followed by Dr. Andrey Farmer with [facility], last seen 09/17/2015  Negative genetic testing for Marfan's Syndrome  09/30/15 Echocardiogram performed EF>55%, mild mitral prolapse and root dilation  ?? Continue Losartan for HTN, at goal today  ??    COPD (chronic obstructive pulmonary disease) - chronic  Manages by Dr. Laverle Patter, seeing her today  On Stiolto and albuterol  12/15/20 CT Lung Cancer screening shows:   --0.4 cm right middle lobe nodule is unchanged in appearance since 2014.??  --0.3 cm left apical nodule is new since 12/02/2019.  --Severe emphysema.  Lung-RADS Category: 2  Follow-up Recommendation: Follow Up LDCT in 1 Year (12/2021)  ?? Stressed importance of follow up with Dr. Sallyanne Kuster on 05/17/21.  ?? Patient had very distant heart sounds today on exam. Dr. Sallyanne Kuster in to auscultate. Ordered chest x-ray today to rule out pericardial effusion.  ?? Chest x-ray  unremarkable. Lung findings of emphysema with no acute cardiopulmonary abnormality.      Mental Health  Followed by [facility]  Recently feeling lonely, he's not sure this has to do with the winter as well.  His sister passed away in 2016-04-12, then her husband passed 04/12/2018, and recently he has some close people to him pass away as well. And I'm still here.  He believes this contributed to him not trying to restart ART.  ?? Mood improved but not where I want to be yet Continues to have challenges getting out of bed. Able to keep the house going but he still has challenges with having energy to do other things. Has appointment with Dr. Derrill Kay on 07/15/21 and will talk about his concerns at that visit.  ?? Currently on Lamictal 200mg  daily and Abilify 2mg  daily      History of chronic renal insufficiency  Stable Scr 1.0-1.16      Nicotine dependence  - chronic, stable  ??? Revisit readiness at next appt.      Monkeypox Education  ??? Discussed current outbreak, risk factors, lesion recognition, and clinical signs/symptoms including fever, headache, myalgias, and fatigue followed a few days later by a rash. Discussed varied appearance of rash including stages of evolution.   ??? Shared relevant information and vaccine access. Discussed that monkeypox vaccines are free and are based on availability of vaccine at local health depts and at our [facility] ID clinic.  ??? Declines MPX vaccine today. Will consider for future visit.      Sexual health & secondary prevention  - chronic, stable  Not in relationship. Has not had sex since last visit.   Parts of body used during sex include: anus/rectum, mouth and penis. Versatile for anal sex. Gives and receives oral sex. Does not have vaginal sex.   Since last visit has had UNKNOWN sex and has not had add'l STI screening.  He does routinely discuss HIV status with partner(s).  Have not discussed interest in having children.    Lab Results   Component Value Date    RPR Nonreactive 01/20/2021    RPR Nonreactive 10/22/2019    CTNAA Negative 03/02/2021    CTNAA Negative 01/20/2021    CTNAA Negative 01/20/2021    GCNAA Negative 03/02/2021    GCNAA Negative 01/20/2021    GCNAA Negative 01/20/2021    SPECSOURCE Urine 03/02/2021    SPECSOURCE Throat 01/20/2021    SPECSOURCE Rectum 01/20/2021     ??? GC/CT NAATs - not being checked routinely for this patient  ??? RPR - for screening obtained today      Health maintenance  - chronic, stable  PCP: Has tried to establish care with [facility] but told they are not accepting new patients at this time. EBrantley Fling was able to contact [facility] and give patient direct number to schedule appointment.    Oral health  He does not have a dentist. Last dental exam unknown.    Eye health  He does  use corrective lenses. Last eye exam unknown.    Metabolic conditions  Wt Readings from Last 5 Encounters:   07/12/21 95.6 kg (210 lb 12.8 oz)   05/03/21 97.4 kg (214 lb 12.8 oz)   04/15/21 96.4 kg (212 lb 9.6 oz)   03/18/21 97.1 kg (214 lb)   03/02/21 97.2 kg (214 lb 3.2 oz)     Lab Results   Component Value Date    CREATININE 1.08 05/03/2021  PROTEINUR 58.0 03/25/2020    PCRATIOUR 306 03/25/2020    GLU 116 05/03/2021    A1C 5.0 03/25/2020    ALT 17 05/03/2021    ALT 24 03/02/2021    ALT 19 01/20/2021    VITDTOTAL 26.3 11/25/2019     # Kidney health - creatinine today  # Bone health - Last DEXA 07/2018  and FRAX score Major osteoporotic 6.1%/Hip Fracture 1.3% on this date: 05/04/2021 T score -1.0, repeat in 5 years 04/2026  # Diabetes assessment - random glucose today  # NAFLD assessment - Needs to follow back up with Hepatology    Communicable diseases  Lab Results   Component Value Date    HEPAIGG Reactive (A) 05/21/2015    HEPBSAB Reactive 01/10/2015    HEPCAB Positive (A) 01/10/2015    HCVRNA Not Detected 07/31/2018    HCVRNAIU 16 03/19/2015    HCVIU [id number] 07/29/2014     # TB screening - assessment needed but deferred to future visit  # Hepatitis screening - Treated  # MMR screening - not assessed    Cancer screening  Lab Results   Component Value Date    PSA 0.16 05/02/2017    FINALDX  10/12/2018     A: Stomach, biopsy  - Gastric fundic and antral mucosa with chronic superficial gastritis  - No Helicobacter pylori identified on H&E stain    This electronic signature is attestation that the pathologist personally reviewed the submitted material(s) and the final diagnosis reflects that evaluation.       # Anorectal - not yet done  # Colorectal - Has been ordered but patient's sister not available to take him till August.  # Liver - Defer to Hepatology  # Lung - repeat low-dose CT 34m from prior  # Prostate - PSA ordered    Cardiovascular disease  Lab Results   Component Value Date    CHOL 123 03/25/2020    HDL 66 03/25/2020    LDL 41 03/25/2020    NONHDL 103 10/04/2011    TRIG 79 03/25/2020     # The 10-year ASCVD risk score Denman George DC Jr., et al., 2013) is: 18.3%  - is not taking aspirin   - is taking statin  - BP control fair  - current smoker  # AAA screening - ultrasound ordered    Immunization History   Administered Date(s) Administered   ??? COVID-19 VACC,IM,MRNA(BOOSTER)OR(6-34YR)(MODERNA) 03/18/2021   ??? COVID-19 VACCINE,MRNA(MODERNA)(PF)(IM) 12/12/2019, 01/07/2020, 09/03/2020   ??? Hepatitis B, Adult 04/17/2012, 07/17/2012, 01/29/2013   ??? INFLUENZA TIV (TRI) PF (IM) 08/21/2007, 07/29/2008, 09/29/2009, 07/06/2010, 10/04/2011, 07/17/2012, 08/20/2013   ??? Influenza Vaccine Quad (IIV4 PF) 44mo+ injectable 07/29/2014, 08/20/2015, 06/28/2016, 08/02/2017, 07/31/2018, 10/22/2019, 07/06/2020   ??? Influenza Virus Vaccine, unspecified formulation 07/29/2014, 08/20/2015, 06/28/2016, 08/02/2017   ??? Novel Influenza-h1n1-09, All Formulations 11/04/2008   ??? PNEUMOCOCCAL POLYSACCHARIDE 23 11/25/2003, 02/17/2009, 01/12/2016   ??? Pneumococcal Conjugate 13-Valent 10/23/2012   ??? TdaP 02/22/2006, 06/19/2013     Immunizations today - Shingrix needed but defers to next office visit. Getting CoVid bivalent booster today.    Counseling services took more than 50% of today's visit.  I personally spent 35 minutes face-to-face and non-face-to-face in the care of this patient, which includes all pre, intra, and post visit time on the date of service.      Disposition  Next appointment: 3 months      To do @ next RTC  ??? [facility]?  ??? Need to see Hepatology  ???  Shingrix  ??? CoVid booster    Varney Daily, FNP-BC  [facility] Infectious Diseases Clinic at [facility]  [address], [place], [place] [address]    Phone: [phone]   Fax: [phone]             Subjective      Chief Complaint   HIV follow up    HPI  In addition to details in A&P above:  ?? Still not where he'd like to be mental health wise, hard to get out of bed.  ?? No missed ART doses.  Denies any fever, chills, nausea, vomiting, rash, urinary complaints, diarrhea, constipation.    Past Medical History:   Diagnosis Date   ??? Chronic renal insufficiency 06/12/2013   ??? COPD (chronic obstructive pulmonary disease) (CMS-HCC) 01/29/2013   ??? Emphysema of lung (CMS-HCC)    ??? Hepatitis C    ??? Human immunodeficiency virus (HIV) disease (CMS-HCC) 01/29/2013   ??? Hypercholesteremia 01/29/2013   ??? Reflux    ??? Vomiting     after eating         Social History  Background - Lives with roommate, has been socializing a little bit more    Housing - in house with roommate  School / Work & Benefits - on [employer]    Social History     Tobacco Use   ??? Smoking status: Current Every Day Smoker     Packs/day: 0.50     Years: 43.00     Pack years: 21.50     Types: Cigarettes     Start date: 01/29/1970   ??? Smokeless tobacco: Never Used   ??? Tobacco comment: 10 cigarettes/day   Substance Use Topics   ??? Alcohol use: Yes     Alcohol/week: 0.0 standard drinks     Comment: sunday ~ 2 mixed drinks (vodka)    ??? Drug use: Yes     Frequency: 2.0 times per week     Types: Marijuana     Comment: weekly use ~ 2 times week       Review of Systems  As per HPI. All others negative.      Medications and Allergies  He has a current medication list which includes the following prescription(s): albuterol, aripiprazole, atorvastatin, atovaquone, tivicay, descovy, fluticasone propionate, lamotrigine, losartan, naproxen sodium, stiolto respimat, and trazodone.    Allergies: Pollen extracts and Sulfa (sulfonamide antibiotics)      Family History  His family history includes Hepatitis in his sister; Parkinsonism in his sister; Rheumatologic disease in his sister. He was adopted.          Objective:      There were no vitals taken for this visit.  Wt Readings from Last 3 Encounters:   07/12/21 95.6 kg (210 lb 12.8 oz)   05/03/21 97.4 kg (214 lb 12.8 oz)   04/15/21 96.4 kg (212 lb 9.6 oz)       Const looks well and attentive, alert, appropriate   Eyes sclerae anicteric, noninjected OU   ENT masked   Lymph no cervical or supraclavicular LAD   CV RRR. No murmurs. No rub or gallop. S1/S2. Very distant heart tones, able to auscultate faint heart sounds when patient lying on left side   Lungs CTAB ant/post, normal work of breathing   GI Soft, no organomegaly. NTND. NABS.   GU deferred   Rectal deferred   Skin no petechiae, ecchymoses or obvious rashes on clothed exam   MSK no joint tenderness and normal  ROM throughout   Neuro grossly intact   Psych Appropriate affect. Eye contact good. Linear thoughts. Fluent speech.     Laboratory Data  Reviewed in Epic today, using Synopsis and Chart Review filters.    Lab Results   Component Value Date    CREATININE 1.08 05/03/2021    HEPCAB Positive (A) 01/10/2015    HCVRNA Not Detected 07/31/2018    HCVRNAIU 16 03/19/2015    HCVIU [id number] 07/29/2014    CHOL 123 03/25/2020    HDL 66 03/25/2020    LDL 41 03/25/2020    NONHDL 103 10/04/2011    TRIG 79 03/25/2020    PSA 0.16 05/02/2017    A1C 5.0 03/25/2020    FINALDX  10/12/2018     A: Stomach, biopsy  - Gastric fundic and antral mucosa with chronic superficial gastritis  - No Helicobacter pylori identified on H&E stain    This electronic signature is attestation that the pathologist personally reviewed the submitted material(s) and the final diagnosis reflects that evaluation.

## 2021-07-13 LAB — TB AG1: TB AG1 VALUE: 0.06

## 2021-07-13 LAB — TB MITOGEN: TB MITOGEN VALUE: 8.31

## 2021-07-13 LAB — QUANTIFERON TB GOLD PLUS
QUANTIFERON ANTIGEN 1 MINUS NIL: -0.01 [IU]/mL
QUANTIFERON ANTIGEN 2 MINUS NIL: -0.02 [IU]/mL
QUANTIFERON MITOGEN: 8.24 [IU]/mL
QUANTIFERON TB GOLD PLUS: NEGATIVE
QUANTIFERON TB NIL VALUE: 0.07 [IU]/mL

## 2021-07-13 LAB — HEPATITIS C RNA, QUANTITATIVE, PCR: HCV RNA: NOT DETECTED

## 2021-07-13 LAB — SYPHILIS SCREEN: SYPHILIS RPR SCREEN: NONREACTIVE

## 2021-07-13 LAB — TB AG2: TB AG2 VALUE: 0.05

## 2021-07-13 LAB — TB NIL: TB NIL VALUE: 0.07

## 2021-07-14 LAB — HIV RNA, QUANTITATIVE, PCR: HIV RNA QNT RSLT: NOT DETECTED

## 2021-07-15 ENCOUNTER — Ambulatory Visit: Admit: 2021-07-15 | Discharge: 2021-07-16 | Payer: MEDICARE | Attending: Family | Primary: Family

## 2021-07-15 ENCOUNTER — Ambulatory Visit
Admit: 2021-07-15 | Discharge: 2021-07-16 | Payer: MEDICARE | Attending: Student in an Organized Health Care Education/Training Program | Primary: Student in an Organized Health Care Education/Training Program

## 2021-07-15 DIAGNOSIS — F32A Depression, unspecified depression type: Principal | ICD-10-CM

## 2021-07-15 DIAGNOSIS — Z6825 Body mass index (BMI) 25.0-25.9, adult: Secondary | ICD-10-CM | POA: Diagnosis not present

## 2021-07-15 DIAGNOSIS — F439 Reaction to severe stress, unspecified: Secondary | ICD-10-CM | POA: Diagnosis not present

## 2021-07-15 MED ORDER — LAMOTRIGINE 200 MG TABLET
ORAL_TABLET | Freq: Every day | ORAL | 0 refills | 90 days | Status: CP
Start: 2021-07-15 — End: ?
  Filled 2021-09-30: qty 90, 90d supply, fill #0

## 2021-07-15 MED ORDER — TRAZODONE 100 MG TABLET
ORAL_TABLET | Freq: Every evening | ORAL | 0 refills | 100.00000 days | Status: CP
Start: 2021-07-15 — End: ?
  Filled 2021-09-01: qty 225, 90d supply, fill #0

## 2021-07-15 MED ORDER — ARIPIPRAZOLE 5 MG TABLET
ORAL_TABLET | Freq: Every day | ORAL | 0 refills | 90.00000 days | Status: CP
Start: 2021-07-15 — End: ?
  Filled 2021-07-21: qty 90, 90d supply, fill #0

## 2021-07-15 MED FILL — DESCOVY 200 MG-25 MG TABLET: ORAL | 30 days supply | Qty: 30 | Fill #2

## 2021-07-15 MED FILL — TIVICAY 50 MG TABLET: ORAL | 30 days supply | Qty: 30 | Fill #2
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
## 2021-07-15 NOTE — Unmapped (Unsigned)
Referral Services Note     Duration of Intervention: 5 minutes    TYPE OF CONTACT: [facility] Referral    ASSESSMENT: Received epic chat from Loanne Drilling, MD that pt is interested in mental health counseling via video. He agreed to get assistance with provider finding through [place].     INTERVENTION:  SW input referral to [facility].     PLAN:  Tawnya Crook will reach out to pt to help with provider finding.      Bradly Bienenstock, LCSW, CHES  [facility] Lead Social Worker

## 2021-07-16 NOTE — Unmapped (Signed)
RD called pt to screen for food insecurity based on FPL (<300%) and RW eligibility. RD asked the following questions:    Food Insecurity:    I'm going to read you two statements that people have made about their food situation. For each statement, please tell me whether the statement was often true, sometimes true, or never true for your household in the last month.      1. ???We worried whether our food would run out before we got money to buy more.???   Never True      2. ???The food that we bought just didn't last, and we didn't have money to get more.  Sometimes True      SNAP benefits: no  SNAP $ per month: n/a  Food pantry: no    Interventions used/offered: RD sent pt SNAP application link via mychart.    Barriers to Care: n/a    Time of Intervention: 2 minutes    Unk Pinto, MS, RD, LDN

## 2021-07-23 NOTE — Unmapped (Signed)
[facility] Refill Coordination Note    Specialty Medication(s) to be Shipped:   Infectious Disease: Atovaquone suspension    Other medication(s) to be shipped: No additional medications requested for fill at this time     Andrew Oconnell, DOB: Apr 20, 1956  Phone: [phone] (home)       All above HIPAA information was verified with patient.     Was a Nurse, learning disability used for this call? No    Completed refill call assessment today to schedule patient's medication shipment from the [facility] [phone]).  All relevant notes have been reviewed.     Specialty medication(s) and dose(s) confirmed: Regimen is correct and unchanged.   Changes to medications: Andrew Oconnell reports no changes at this time.  Changes to insurance: No  New side effects reported not previously addressed with a pharmacist or physician: None reported  Questions for the pharmacist: No    Confirmed patient received a Conservation officer, historic buildings and a Surveyor, mining with first shipment. The patient will receive a drug information handout for each medication shipped and additional FDA Medication Guides as required.       DISEASE/MEDICATION-SPECIFIC INFORMATION        N/A    SPECIALTY MEDICATION ADHERENCE     Medication Adherence    Patient reported X missed doses in the last month: 0  Specialty Medication: Mepron 750mg /27mL  Patient is on additional specialty medications: No  Patient is on more than two specialty medications: Yes  Informant: patient              Were doses missed due to medication being on hold? No    Atovaquone 750mg /64mL: 8 days of medicine on hand       REFERRAL TO PHARMACIST     Referral to the pharmacist: Not needed      [facility]     Shipping address confirmed in Epic.     Delivery Scheduled: Yes, Expected medication delivery date: 07/28/21.     Medication will be delivered via UPS to the prescription address in Epic [place].    Andrew Oconnell   [facility] Specialty Technician

## 2021-07-27 MED FILL — ATOVAQUONE 750 MG/5 ML ORAL SUSPENSION: ORAL | 30 days supply | Qty: 300 | Fill #3

## 2021-07-29 DIAGNOSIS — Z21 Asymptomatic human immunodeficiency virus [HIV] infection status: Secondary | ICD-10-CM | POA: Diagnosis not present

## 2021-07-29 DIAGNOSIS — Z0000 Encounter for general adult medical examination without abnormal findings: Secondary | ICD-10-CM | POA: Diagnosis not present

## 2021-07-29 DIAGNOSIS — F17200 Nicotine dependence, unspecified, uncomplicated: Secondary | ICD-10-CM | POA: Diagnosis not present

## 2021-07-29 DIAGNOSIS — Z1389 Encounter for screening for other disorder: Secondary | ICD-10-CM | POA: Diagnosis not present

## 2021-08-02 MED FILL — LOSARTAN 25 MG TABLET: ORAL | 30 days supply | Qty: 30 | Fill #5

## 2021-08-02 MED FILL — STIOLTO RESPIMAT 2.5 MCG-2.5 MCG/ACTUATION SOLUTION FOR INHALATION: RESPIRATORY_TRACT | 30 days supply | Qty: 4 | Fill #5

## 2021-08-02 MED FILL — ATORVASTATIN 10 MG TABLET: ORAL | 30 days supply | Qty: 30 | Fill #5

## 2021-08-06 NOTE — Unmapped (Signed)
[facility] Refill Coordination Note    Specialty Medication(s) to be Shipped:   Infectious Disease: Descovy and Tivicay    Other medication(s) to be shipped: No additional medications requested for fill at this time     Andrew Oconnell, DOB: 1956/04/21  Phone: [phone] (home)       All above HIPAA information was verified with patient.     Was a Nurse, learning disability used for this call? No    Completed refill call assessment today to schedule patient's medication shipment from the [facility] [phone]).  All relevant notes have been reviewed.     Specialty medication(s) and dose(s) confirmed: Regimen is correct and unchanged.   Changes to medications: Andrew Oconnell reports no changes at this time.  Changes to insurance: No  New side effects reported not previously addressed with a pharmacist or physician: None reported  Questions for the pharmacist: No    Confirmed patient received a Conservation officer, historic buildings and a Surveyor, mining with first shipment. The patient will receive a drug information handout for each medication shipped and additional FDA Medication Guides as required.       DISEASE/MEDICATION-SPECIFIC INFORMATION        N/A    SPECIALTY MEDICATION ADHERENCE     Medication Adherence    Patient reported X missed doses in the last month: 0  Specialty Medication: descovy  Patient is on additional specialty medications: Yes  Additional Specialty Medications: tivicay   Patient Reported Additional Medication X Missed Doses in the Last Month: 0  Patient is on more than two specialty medications: No  Any gaps in refill history greater than 2 weeks in the last 3 months: no  Demonstrates understanding of importance of adherence: yes  Informant: patient  Reliability of informant: reliable  Provider-estimated medication adherence level: good  Patient is at risk for Non-Adherence: No  Reasons for non-adherence: no problems identified              Were doses missed due to medication being on hold? No      tivicay 50 mg: 14 days of medicine on hand   [id number]   mg: 14 days of medicine on hand     REFERRAL TO PHARMACIST     Referral to the pharmacist: Not needed      [facility]     Shipping address confirmed in Epic.     Delivery Scheduled: Yes, Expected medication delivery date: 10/27.     Medication will be delivered via UPS to the prescription address in Epic [place].    Andrew Oconnell   [facility] Specialty Technician

## 2021-08-06 NOTE — Unmapped (Unsigned)
Otolaryngology Clinic Note    Andrew Oconnell is a 65 y.o. male is seen in consultation at the request of Andrew Oconnell S*  for evaluation of nasal obstruction.       History of Present Illness:     The patient is a 65 y.o. male who  has a past medical history of Chronic renal insufficiency (06/12/2013), COPD (chronic obstructive pulmonary disease) (CMS-HCC) (01/29/2013), Emphysema of lung (CMS-HCC), Hepatitis C, Human immunodeficiency virus (HIV) disease (CMS-HCC) (01/29/2013), Hypercholesteremia (01/29/2013), Reflux, and Vomiting. who presents for the evaluation of nasal obstruction.  Patient reports longstanding history of nasal obstruction bilaterally.  Reports he has broken his nose severely and had a rhinoplasty previously.  Of note the patient has HIV on antiretroviral therapy.  He is not using any nasal sprays right now.  Not used saline irrigations.  Reports he is previously used Flonase without benefit (generally patient should not be on Flonase and antiretroviral therapy as it increases systemic uptake).  Denies any symptomatic differences between the left and right nostril.  Patient reports seasonal symptoms as well.  Reports it seems to be worse in the spring mildly worse in the fall.  Never been allergy tested.    Update 08/06/21:   Patient returns for follow up. Last seen 06/2020. ENT environmental panel is positive for various allergens as noted in Labs. He also had a CT scan 12/2019 that I have personally reviewed and reveals ***    Patients medical records were personally reviewed.      A 12 point review of systems was negative except as indicated.  The patient denies fevers, chills, shortness of breath, chest pain, nausea, vomiting, diarrhea, inability to lie flat, dysphagia, odynophagia, hemoptysis, hematemesis, changes in vision, changes in voice quality, otalgia, otorrhea, vertiginous symptoms, focal deficits, or other concerning symptoms.    Past Medical History     has a past medical history of Chronic renal insufficiency (06/12/2013), COPD (chronic obstructive pulmonary disease) (CMS-HCC) (01/29/2013), Emphysema of lung (CMS-HCC), Hepatitis C, Human immunodeficiency virus (HIV) disease (CMS-HCC) (01/29/2013), Hypercholesteremia (01/29/2013), Reflux, and Vomiting.    Past Surgical History     has a past surgical history that includes Ligation / division saphenous vein (Bilateral); pr upper gi endoscopy,biopsy (N/A, 05/20/2014); Back surgery (N/A, 1990); pr esophageal motility study, manometry (N/A, 06/04/2014); pr colsc flx w/rmvl of tumor polyp lesion snare tq (N/A, 05/08/2015); pr upper gi endoscopy,diagnosis (N/A, 11/06/2015); pr shldr arthroscop,surg,w/rotat cuff repr (Left, 03/11/2016); pr surgical arthroscopy shoulder xtnsv dbrdmt 3+ (Left, 03/11/2016); pr repair biceps long tendon (Left, 03/11/2016); pr upper gi endoscopy,biopsy (N/A, 10/12/2018); and pr upper gi endoscopy,diagnosis (N/A, 01/31/2020).    Current Medications    Current Outpatient Medications   Medication Sig Dispense Refill   ??? albuterol HFA 90 mcg/actuation inhaler Inhale 1 puff every six (6) hours as needed for wheezing or shortness of breath. 8.5 g 0   ??? ARIPiprazole (ABILIFY) 5 MG tablet Take 1 tablet (5 mg total) by mouth daily. 90 tablet 0   ??? atorvastatin (LIPITOR) 10 MG tablet Take 1 tablet (10 mg total) by mouth daily. 30 tablet 5   ??? atovaquone (MEPRON) 750 mg/5 mL suspension Take 10 mL (1,500 mg total) by mouth daily. 300 mL 5   ??? dolutegravir (TIVICAY) 50 mg TABLET Take 1 tablet (50 mg total) by mouth daily. 30 tablet 5   ??? emtricitabine-tenofovir alafen (DESCOVY) 200-25 mg tablet Take 1 tablet by mouth daily. 30 tablet 5   ??? fluticasone (FLONASE) 50 mcg/actuation  nasal spray 1 spray by Each Nare route daily. 16 g 0   ??? lamoTRIgine (LAMICTAL) 200 MG tablet Take 1 tablet (200 mg total) by mouth in the morning. 90 tablet 0   ??? losartan (COZAAR) 25 MG tablet Take 1 tablet (25 mg total) by mouth daily. 30 tablet 5   ??? naproxen sodium (ALEVE) 220 MG tablet Take 220 mg by mouth daily as needed for pain. (Patient not taking: Reported on 07/12/2021)     ??? tiotropium-olodateroL (STIOLTO RESPIMAT) 2.5-2.5 mcg/actuation Mist Inhale 2 puffs daily. 4 g 11   ??? traZODone (DESYREL) 100 MG tablet Take 2.5 tablets (250 mg total) by mouth nightly. 250 tablet 0     No current facility-administered medications for this visit.       Allergies    Allergies   Allergen Reactions   ??? Pollen Extracts      Congestion     ??? Sulfa (Sulfonamide Antibiotics) Rash       Family History    Negative for bleeding disorders or free bleeding.     family history includes Hepatitis in his sister; Parkinsonism in his sister; Rheumatologic disease in his sister. He was adopted.    Social History:     reports that he has been smoking cigarettes. He started smoking about 51 years ago. He has a 21.50 pack-year smoking history. He has never used smokeless tobacco.   reports current alcohol use.   reports current drug use. Frequency: 2.00 times per week. Drug: Marijuana.    Review of Systems    A 12 system review of systems was performed and is negative other than that noted in the history of present illness.    Vital Signs  There were no vitals taken for this visit.      Physical Exam  General: Well-developed, well-nourished. Appropriate, comfortable, and in no apparent distress.  Head/Face: On external examination there is no obvious asymmetry or scars. On palpation there is no tenderness over maxillary sinuses or masses within the salivary glands. Cranial nerves V and VII are intact through all distributions.  Eyes: PERRL, EOMI, the conjunctiva are not injected and sclera is non-icteric.  Ears: On external exam, there is no obvious lesions or asymmetry. The EACs are bilaterally without cerumen or lesions. The TMs are in the neutral position and are mobile to pneumatic otoscopy bilaterally. There are no middle ear masses or fluid noted. Hearing is grossly intact bilaterally.  Nose: On external exam there are neither lesions nor asymmetry of the nasal tip/ dorsum. On anterior rhinoscopy, visualization posteriorly is limited on anterior examination. For this reason, to adequately evaluate posteriorly for masses, polypoid disease and/or signs of infections, nasal endoscopy is indicated (see procedure below).  Oral cavity/oropharynx: The mucosa of the lips, gums, hard and soft palate, posterior pharyngeal wall, tongue, floor of mouth, and buccal region are without masses or lesions and are normally hydrated. Good dentition. Tongue protrudes midline. Tonsils are normal appearing. Supraglottis not visualized due to gag reflex.  Neck: There is no asymmetry or masses. Trachea is midline. There is no enlargement of the thyroid or palpable thyroid nodules.   Lymphatics: There is no palpable lymphadenopathy along the jugulodiagastric, submental, or posterior cervical chains.  Chest: No audible wheeze, unlabored respirations.  Cardiovascular: Regular rate.  GI: Nondistended.  Neurologic: Cranial nerve???s II-XII are grossly intact. Exam is non-focal.  Extremities: No cyanosis, clubbing or edema.    Procedures:  Diagnostic Bilateral Nasal Endoscopy (CPT [id number])    NOTE:  Nasal endoscopy is performed for the sinuses only, and not for examination of the skull base, septum or inferior turbinates, nor is it related to any previously performed septoplasty or inferior turbinate surgery or skull base surgery.     Surgeon: Egbert Garibaldi, MD  Anesthesia: none  Procedure Detail:  As a result of inability to visualize the intranasal anatomy, and after discussion of the potential risks related to the procedure (primarily bleeding), a endoscope is used to examine the left and right sinonasal cavities, including the interior of the nasal cavity and the middle and superior meatus, the turbinates, and the spheno-ethmoid recess. All these areas were inspected.    Findings:    Examination on the left reveals an intact nasal septum with no associated masses, lesions, or friable mucosa. The left middle meatus, sphenoethmoidal recess, and skull base are clear with no evidence of purulence, polyposis, or polypoid edema.    Examination on the right reveals an intact nasal septum with no associated masses, lesions, or friable mucosa. The right middle meatus, sphenoethmoidal recess, and skull base are clear with no evidence of purulence, polyposis, or polypoid edema.      Oretha Ellis Nasal Endoscopy Score: [employer] is used to assess the degree of inflammation of the sinonasal structures, including the middle and superior turbinates, the ethmoid sinuses, maxillary sinuses, frontal sinuses, and sphenoid sinuses.  In the presence of previous surgery, some or all of these structures may be absent.    Left        ?? Polyps:  Absent (0)   ?? Edema:   Mild (1)   ?? Discharge:  Clear, Thin (1)    ?? Scarring:  Absent (0)   ?? Crusting:  None (0)      Total Left:  2     Right         ?? Polyps:  Absent (0)   ?? Edema:  Mild (1)   ?? Discharge: Clear, Thin (1)    ?? Scarring:  Absent (0)   ?? Crusting:  None (0)      Total Right:   2      Labs and Diagnostic Tests  none      Assessment:  The patient is a 65 y.o. male who  has a past medical history of Chronic renal insufficiency (06/12/2013), COPD (chronic obstructive pulmonary disease) (CMS-HCC) (01/29/2013), Emphysema of lung (CMS-HCC), Hepatitis C, Human immunodeficiency virus (HIV) disease (CMS-HCC) (01/29/2013), Hypercholesteremia (01/29/2013), Reflux, and Vomiting. who presents for the evaluation of: nasal obstruction.     Recommendations:  1. Mr. Mesta has inflammation in the nasal cavity noted on nasal endoscopy exam today in clinic. We have extensively discussed treatment options.  Given that he is on antiretroviral therapy will defer intranasal corticosteroids at this point.  Encouraged him to start nasal hygiene regimen including twice daily isotonic irrigations.  We will see how he does with this.      We will also obtain blood environmental panel today for allergy testing.  First-line therapy for those obstruction and allergies would be local steroids which would like to avoid in the setting of antiretroviral therapy.    CT f/u 1-2 months.       The patient's physical examination findings including flexible fiberoptic nasopharyngolaryngosocpy/sinonasal endoscopy were thoroughly discussed.    The patient voiced complete understanding of plan as detailed above and is in full agreement.

## 2021-08-10 MED ORDER — ATORVASTATIN 10 MG TABLET
ORAL_TABLET | Freq: Every day | ORAL | 1 refills | 90.00000 days
Start: 2021-08-10 — End: ?

## 2021-08-11 MED ORDER — LOSARTAN 25 MG TABLET
ORAL_TABLET | Freq: Every day | ORAL | 1 refills | 90 days
Start: 2021-08-11 — End: ?

## 2021-08-11 MED FILL — DESCOVY 200 MG-25 MG TABLET: ORAL | 30 days supply | Qty: 30 | Fill #3

## 2021-08-11 MED FILL — TIVICAY 50 MG TABLET: ORAL | 30 days supply | Qty: 30 | Fill #3

## 2021-08-25 DIAGNOSIS — Z1211 Encounter for screening for malignant neoplasm of colon: Principal | ICD-10-CM

## 2021-08-31 NOTE — Unmapped (Signed)
Otolaryngology Clinic Note    Andrew Oconnell is a 65 y.o. male is seen in consultation at the request of [name]*  for evaluation of chronic rhinosinusitis      History of Present Illness:     The patient is a 65 y.o. male who  has a past medical history of Chronic renal insufficiency (06/12/2013), COPD (chronic obstructive pulmonary disease) (CMS-HCC) (01/29/2013), Emphysema of lung (CMS-HCC), Hepatitis C, Human immunodeficiency virus (HIV) disease (CMS-HCC) (01/29/2013), Hypercholesteremia (01/29/2013), Reflux, and Vomiting. who presents for the evaluation of chronic rhinosinusitis.    The patient was seen by Pulmonology on 07/12/2021 for COPD. He reports that he was first diagnosed with COPD in 2005. He had previously been seeing a pulmonologist in [place] and has been on Advair with prn albuterol for a few years though he ran out of the Advair a few months ago. He reports that he has had some dyspnea on exertion for the past few months and reports an mMRC score of 2 today. He uses his rescue inhaler 2-3 times per week. He denies any cough but does endorse morning sputum production that is typically clear and never has blood. He denies any wheezing or chest tightness. He has chronic rhinosinusitis, for which he follows with ENT - currently managed with sinus rinses and oral antihistamines. He denies any fevers or chills and reports occasional mild night sweats that have been stable for years. He reports weight gain of 3 pounds in the last month. He denies any lower extremity swelling. He has not required antibiotics or oral steroids in the last 2-3 years for a COPD exacerbation.    He is currently taking Allegra as needed. He no longer uses Flonase because he reports it did not help at all. He did not tolerate nasal irrigation and reports no improvement in nasal CS. He had a CT sinus that was done in Fall 2021.     Today, the patient reports feels like when he sleeps at night his nose clogs up and he mouth breathes when he sleeps which dries out his mouth. He has a really hard time sleeping without being able to breathe through his nose. He feels like his He usually wakes up and has a glob of mucous in his nose that he has to blow out. He has a signficant runny nose when he eats and feels like he drips much more signficantly during meals.. He used Actifed consistently about 40 years ago which dried out his nose but it almost made his nose a little too dry. 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  Patient feels like he is really affected by his allergies in the spring with pollen exposure.  In the 80s he had a sinus washing procedure as well as a possible septoplasty vs rhinoplasty which helped his symptoms for a little while.      He has tried flonase but he feels like he sneezes it all out. He has not tried salt water rinses. Has not tried atrovent. He is not currently on allergy medicine but he used to take allegra and zyrtec and he felt like it didn't help him much.     He used to see Dr. Ladona Ridgel in [facility] for his ENT care, but after this physician passed away he hadn't followed up with ENT since.      Patients medical records were  personally reviewed.      A 12 point review of systems was negative except as indicated.  The patient denies fevers, chills, shortness of breath, chest pain, nausea, vomiting, diarrhea, inability to lie flat, dysphagia, odynophagia, hemoptysis, hematemesis, changes in vision, changes in voice quality, otalgia, otorrhea, vertiginous symptoms, focal deficits, or other concerning symptoms.    Past Medical History     has a past medical history of Chronic renal insufficiency (06/12/2013), COPD (chronic obstructive pulmonary disease) (CMS-HCC) (01/29/2013), Emphysema of lung (CMS-HCC), Hepatitis C, Human immunodeficiency virus (HIV) disease (CMS-HCC) (01/29/2013), Hypercholesteremia (01/29/2013), Reflux, and Vomiting.    Past Surgical History     has a past surgical history that includes Ligation / division saphenous vein (Bilateral); pr upper gi endoscopy,biopsy (N/A, 05/20/2014); Back surgery (N/A, 1990); pr esophageal motility study, manometry (N/A, 06/04/2014); pr colsc flx w/rmvl of tumor polyp lesion snare tq (N/A, 05/08/2015); pr upper gi endoscopy,diagnosis (N/A, 11/06/2015); pr shldr arthroscop,surg,w/rotat cuff repr (Left, 03/11/2016); pr surgical arthroscopy shoulder xtnsv dbrdmt 3+ (Left, 03/11/2016); pr repair biceps long tendon (Left, 03/11/2016); pr upper gi endoscopy,biopsy (N/A, 10/12/2018); and pr upper gi endoscopy,diagnosis (N/A, 01/31/2020).    Current Medications    Current Outpatient Medications   Medication Sig Dispense Refill   ??? albuterol HFA 90 mcg/actuation inhaler Inhale 1 puff every six (6) hours as needed for wheezing or shortness of breath. 8.5 g 0   ??? ARIPiprazole (ABILIFY) 5 MG tablet Take 1 tablet (5 mg total) by mouth daily. 90 tablet 0   ??? atorvastatin (LIPITOR) 10 MG tablet Take 1 tablet (10 mg total) by mouth daily. 90 tablet 1   ??? atovaquone (MEPRON) 750 mg/5 mL suspension Take 10 mL (1,500 mg total) by mouth daily. 300 mL 5   ??? dolutegravir (TIVICAY) 50 mg TABLET Take 1 tablet (50 mg total) by mouth daily. 30 tablet 5   ??? emtricitabine-tenofovir alafen (DESCOVY) 200-25 mg tablet Take 1 tablet by mouth daily. 30 tablet 5   ??? fluticasone (FLONASE) 50 mcg/actuation nasal spray 1 spray by Each Nare route daily. 16 g 0   ??? lamoTRIgine (LAMICTAL) 200 MG tablet Take 1 tablet (200 mg total) by mouth in the morning. 90 tablet 0   ??? losartan (COZAAR) 25 MG tablet Take 1 tablet (25 mg total) by mouth daily. 90 tablet 1   ??? naproxen sodium (ALEVE) 220 MG tablet Take 220 mg by mouth daily as needed for pain. (Patient not taking: Reported on 07/12/2021)     ??? tiotropium-olodateroL (STIOLTO RESPIMAT) 2.5-2.5 mcg/actuation Mist Inhale 2 puffs daily. 4 g 11   ??? traZODone (DESYREL) 100 MG tablet Take 2.5 tablets (250 mg total) by mouth nightly. 250 tablet 0     No current facility-administered medications for this visit.       Allergies    Allergies   Allergen Reactions   ??? Pollen Extracts      Congestion     ??? Sulfa (Sulfonamide Antibiotics) Rash       Family History    Negative for bleeding disorders or free bleeding.     family history includes Hepatitis in his sister; Parkinsonism in his sister; Rheumatologic disease in his sister. He was adopted.    Social History:     reports that he has been smoking cigarettes. He started smoking about 51 years ago. He has a 21.50 pack-year smoking history. He has never used smokeless tobacco.   reports current alcohol use.   reports current drug use. Frequency: 2.00 times  per week. Drug: Marijuana.    Review of Systems    A 12 system review of systems was performed and is negative other than that noted in the history of present illness.    Vital Signs  There were no vitals taken for this visit.    RSDI today was 18    Physical Exam    General: Well-developed, well-nourished. Appropriate, comfortable, and in no apparent distress.  Head/Face: On external examination there is no obvious asymmetry or scars. On palpation there is no tenderness over maxillary sinuses or masses within the salivary glands. Cranial nerves V and VII are intact through all distributions.  Eyes: PERRL, EOMI, the conjunctiva are not injected and sclera is non-icteric.  Ears: On external exam, there is no obvious lesions or asymmetry. The EACs are bilaterally without cerumen or lesions. The TMs are in the neutral position and are mobile to pneumatic otoscopy bilaterally. There are no middle ear masses or fluid noted. Hearing is grossly intact bilaterally.  Nose: On external exam there are neither lesions nor asymmetry of the nasal tip/ dorsum. On anterior rhinoscopy, visualization posteriorly is limited on anterior examination. For this reason, to adequately evaluate posteriorly for masses, polypoid disease and/or signs of infections, nasal endoscopy is indicated (see procedure below).  Oral cavity/oropharynx: The mucosa of the lips, gums, hard and soft palate, posterior pharyngeal wall, tongue, floor of mouth, and buccal region are without masses or lesions and are normally hydrated. Good dentition. Tongue protrudes midline. Tonsils are normal appearing. Supraglottis not visualized due to gag reflex.  Neck: There is no asymmetry or masses. Trachea is midline. There is no enlargement of the thyroid or palpable thyroid nodules.   Lymphatics: There is no palpable lymphadenopathy along the jugulodiagastric, submental, or posterior cervical chains.  Chest: No audible wheeze, unlabored respirations.  Cardiovascular: Regular rate.  GI: Nondistended.  Neurologic: Cranial nerve???s II-XII are grossly intact. Exam is non-focal.  Extremities: No cyanosis, clubbing or edema.    Procedures:  Diagnostic Bilateral Nasal Endoscopy performed on 09/03/2021 (CPT 31231)    NOTE: Nasal endoscopy is performed for the sinuses only, and not for examination of the skull base, septum or inferior turbinates, nor is it related to any previously performed septoplasty or inferior turbinate surgery or skull base surgery.     Surgeon: Egbert Garibaldi, MD  Anesthesia: none  Procedure Detail:  As a result of inability to visualize the intranasal anatomy, and after discussion of the potential risks related to the procedure (primarily bleeding), a endoscope is used to examine the left and right sinonasal cavities, including the interior of the nasal cavity and the middle and superior meatus, the turbinates, and the spheno-ethmoid recess. All these areas were inspected.    Findings:    Examination on the left reveals an intact nasal septum with no associated masses, lesions, or friable mucosa. The left middle meatus, sphenoethmoidal recess, and skull base are clear with no evidence of purulence or polyposis. There is mild polypoid edema and thick opaque mucous especially in the posterior nasal cavity.      Examination on the right reveals an intact nasal septum with no associated masses, lesions, or friable mucosa. The right middle meatus, sphenoethmoidal recess, and skull base are clear with no evidence of purulence or polyposis. There is mild polypoid edema and thick opaque mucous especially in the posterior nasal cavity.        Oretha Ellis Nasal Endoscopy Score: [employer] is used to assess  the degree of inflammation of the sinonasal structures, including the middle and superior turbinates, the ethmoid sinuses, maxillary sinuses, frontal sinuses, and sphenoid sinuses.  In the presence of previous surgery, some or all of these structures may be absent.    Left        ?? Polyps:  Absent (0)   ?? Edema:   Severe (2)   ?? Discharge:  Clear, Thin (1)    ?? Scarring:  Absent (0)   ?? Crusting:  None (0)      Total Left:  3     Right         ?? Polyps:  Absent (0)   ?? Edema:  Severe (2)   ?? Discharge: Clear, Thin (1)    ?? Scarring:  Absent (0)   ?? Crusting:  None (0)      Total Right:   3      Labs and Diagnostic Tests  NA      Assessment:  The patient is a 65 y.o. male who  has a past medical history of Chronic renal insufficiency (06/12/2013), COPD (chronic obstructive pulmonary disease) (CMS-HCC) (01/29/2013), Emphysema of lung (CMS-HCC), Hepatitis C, Human immunodeficiency virus (HIV) disease (CMS-HCC) (01/29/2013), Hypercholesteremia (01/29/2013), Reflux, and Vomiting. who presents for the evaluation of:  Inferior Turbinate Hypertrophy  Posterior Nasal Drainage  Nasal Airway Obstruction  Gustatory rhinitis   Anterior rhinorrhea    Recommendations:  1. Mr. Lemay has in the nasal cavity noted on nasal endoscopy exam today in clinic. We have extensively discussed treatment options. The patient will start on a nasal hygiene regimen which includes BID isotonic nasal irrigation and Atrovent nasal sprays (2 sprays TID or QID each nostril). We have discussed the proper positioning for optimal use of the nasal steroid.     Follow-up will be scheduled in 3-4 months to assess the response to medical therapy. Should medical therapy fail we plan to discuss other treatment options.    The patient's physical examination findings including flexible fiberoptic nasopharyngolaryngosocpy/sinonasal endoscopy were thoroughly discussed.    The patient voiced complete understanding of plan as detailed above and is in full agreement.    Scribe's Attestation: Egbert Garibaldi, MD obtained and performed the history, physical exam and medical decision making elements that were entered into the chart. Signed by Pearson Forster, Scribe, on September 03, 2021 at 10:37 AM.  ----------------------------------------------------------------------------------------------------------------------  September 03, 2021 4:22 PM. Documentation assistance provided by the Scribe. I was present during the time the encounter was recorded. The information recorded by the Scribe was done at my direction and has been reviewed and validated by me.  ----------------------------------------------------------------------------------------------------------------------

## 2021-09-01 MED FILL — LOSARTAN 25 MG TABLET: ORAL | 90 days supply | Qty: 90 | Fill #0

## 2021-09-01 MED FILL — STIOLTO RESPIMAT 2.5 MCG-2.5 MCG/ACTUATION SOLUTION FOR INHALATION: RESPIRATORY_TRACT | 30 days supply | Qty: 4 | Fill #6

## 2021-09-01 MED FILL — ATORVASTATIN 10 MG TABLET: ORAL | 90 days supply | Qty: 90 | Fill #0

## 2021-09-02 NOTE — Unmapped (Signed)
[facility] Refill Coordination Note    Specialty Medication(s) to be Shipped:   Infectious Disease: Descovy and Tivicay    Other medication(s) to be shipped: No additional medications requested for fill at this time     Andrew Oconnell, DOB: [birth year]  Phone: [phone] (home)       All above HIPAA information was verified with patient.     Was a Nurse, learning disability used for this call? No    Completed refill call assessment today to schedule patient's medication shipment from the [facility] [phone]).  All relevant notes have been reviewed.     Specialty medication(s) and dose(s) confirmed: Regimen is correct and unchanged.   Changes to medications: [name] reports no changes at this time.  Changes to insurance: No  New side effects reported not previously addressed with a pharmacist or physician: None reported  Questions for the pharmacist: No    Confirmed patient received a Conservation officer, historic buildings and a Surveyor, mining with first shipment. The patient will receive a drug information handout for each medication shipped and additional FDA Medication Guides as required.       DISEASE/MEDICATION-SPECIFIC INFORMATION        N/A    SPECIALTY MEDICATION ADHERENCE     Medication Adherence    Patient reported X missed doses in the last month: 0  Specialty Medication: DESCOVY  Patient is on additional specialty medications: Yes  Additional Specialty Medications: TIVICAY    Patient Reported Additional Medication X Missed Doses in the Last Month: 0  Patient is on more than two specialty medications: No              Were doses missed due to medication being on hold? No    Tivicay 50 mg: 17 days of medicine on hand   Descovy 200-25 mg: 17 days of medicine on hand        REFERRAL TO PHARMACIST     Referral to the pharmacist: Not needed      [facility]     Shipping address confirmed in Epic.     Delivery Scheduled: Yes, Expected medication delivery date: 09/15/21.     Medication will be delivered via UPS to the prescription address in Epic WAM.    Unk Lightning   [facility] Specialty Technician

## 2021-09-03 ENCOUNTER — Ambulatory Visit
Admit: 2021-09-03 | Discharge: 2021-09-04 | Payer: MEDICARE | Attending: Student in an Organized Health Care Education/Training Program | Primary: Student in an Organized Health Care Education/Training Program

## 2021-09-03 DIAGNOSIS — J329 Chronic sinusitis, unspecified: Principal | ICD-10-CM

## 2021-09-03 DIAGNOSIS — J310 Chronic rhinitis: Secondary | ICD-10-CM | POA: Diagnosis not present

## 2021-09-03 MED ORDER — IPRATROPIUM BROMIDE 42 MCG (0.06 %) NASAL SPRAY
Freq: Four times a day (QID) | NASAL | 2 refills | 21.00000 days | Status: CP
Start: 2021-09-03 — End: 2022-09-03
  Filled 2021-09-14: qty 15, 21d supply, fill #0

## 2021-09-14 MED FILL — TIVICAY 50 MG TABLET: ORAL | 30 days supply | Qty: 30 | Fill #4

## 2021-09-14 MED FILL — DESCOVY 200 MG-25 MG TABLET: ORAL | 30 days supply | Qty: 30 | Fill #4

## 2021-10-06 NOTE — Unmapped (Signed)
Colonoscopy  Procedure #1      0  Procedure #2      [id number]  MRN      0  Endoscopist      FALSE  Urgent procedure      FALSE  Do you take: Plavix (clopidogrel), Coumadin (warfarin), Lovenox (enoxaparin), Pradaxa (dabigatran), Effient (prasugrel), Xarelto (rivaroxaban), Eliquis (apixaban), Pletal (cilostazol), or Brilinta (ticagrelor)?    FALSE  Do you have hemophilia, von Willebrand disease, thrombocytopenia?      FALSE  Do you have a pacemaker or implanted cardiac defibrillator?      FALSE  Are you pregnant?      FALSE  Has a [place] [facility] provider specified the location(s)?        Which location(s) did the [facility] provider specify?      FALSE     [facility]      FALSE     [place]      FALSE     [facility]      FALSE     [facility]      FALSE  Is procedure indication for variceal banding (this does NOT include variceal screening)?      FALSE  Have you been diagnosed with sleep apnea or do you wear a CPAP machine at night?      6  Height (feet)      5  Height (inches)      215  Weight (pounds)      25.5  BMI              FALSE  Do you have chronic kidney disease?      FALSE  Do you have chronic constipation or have you had poor quality bowel preps for past colonoscopies?      FALSE  Do you have Crohn's disease or ulcerative colitis?      FALSE  Have you had weight loss surgery?              FALSE  Are you in the process of scheduling or awaiting results of a heart ultrasound, stress test, or catheterization to evaluate new or worsening chest pain, dizziness, or shortness of breath?     FALSE  When you walk around your house or grocery store, do you have to stop and rest due to shortness of breath, chest pain, or light-headedness?      FALSE  Have you had a heart attack, stroke or heart stent placement within the past 6 months?      FALSE  Do you ever use supplemental oxygen?      FALSE  Have you been hospitalized for cirrhosis of the liver or heart failure in the last 12 months?      FALSE  Have you been treated for mouth or throat cancer with radiation or surgery?      FALSE  Have you been told that it is difficult for doctors to insert a breathing tube in you during anesthesia?      FALSE  Have you had a heart or lung transplant?              FALSE  Are you on dialysis?      FALSE  Do you have cirrhosis of the liver?      FALSE  Do you have myasthenia gravis?      FALSE  Is the patient a prisoner?              FALSE  Are  you younger than 30?      FALSE  Have you previously received propofol sedation administered by an anesthesiologist for a GI procedure?      FALSE  Do you drink an average of more than 3 drinks of alcohol per day?      FALSE  Do you regularly take suboxone or any prescription medications for chronic pain?      FALSE  Do you regularly take Ativan, Klonopin, Xanax, Valium, lorazepam, clonazepam, alprazolam, or diazepam?      FALSE  Have you previously had difficulty with sedation during a GI procedure?      FALSE  Have you been diagnosed with PTSD?      FALSE  Are you allergic to fentanyl or midazolam (Versed)?      TRUE  Do you take medications for HIV?      ************************ ************************ ************************ ************************   MRN:          [id number]      Anticoag Review:  No      Nurse Triage:  No      GI Clinic Consult:  No      Procedure(s):  Colonoscopy 0     Location(s):  [facility]  [place]      Endoscopist:  0      Urgent:            No       Prep:               Nulytely              ************************ ************************ ************************ ************************

## 2021-10-07 NOTE — Unmapped (Signed)
[facility] Refill Coordination Note    Specialty Medication(s) to be Shipped:   Infectious Disease: Descovy and Tivicay    Other medication(s) to be shipped: stiolto respimat  Ipratropium nasal spray     Andrew Oconnell, DOB: [birth year]  Phone: [phone] (home)       All above HIPAA information was verified with patient.     Was a Nurse, learning disability used for this call? No    Completed refill call assessment today to schedule patient's medication shipment from the [facility] [phone]).  All relevant notes have been reviewed.     Specialty medication(s) and dose(s) confirmed: Regimen is correct and unchanged.   Changes to medications: Jayren reports no changes at this time.  Changes to insurance: No  New side effects reported not previously addressed with a pharmacist or physician: None reported  Questions for the pharmacist: No    Confirmed patient received a Conservation officer, historic buildings and a Surveyor, mining with first shipment. The patient will receive a drug information handout for each medication shipped and additional FDA Medication Guides as required.       DISEASE/MEDICATION-SPECIFIC INFORMATION        N/A    SPECIALTY MEDICATION ADHERENCE     Medication Adherence    Patient reported X missed doses in the last month: 0  Specialty Medication: descovy  Patient is on additional specialty medications: Yes  Additional Specialty Medications: tivicay  Patient Reported Additional Medication X Missed Doses in the Last Month: 0              Were doses missed due to medication being on hold? No    tivicay  : 14 days of medicine on hand   descovy  : 14 days of medicine on hand       REFERRAL TO PHARMACIST     Referral to the pharmacist: Not needed      [facility]     Shipping address confirmed in Epic.     Delivery Scheduled: Yes, Expected medication delivery date: 12/29.     Medication will be delivered via UPS to the prescription address in Epic WAM.    Westley Gambles   [facility] Specialty Technician

## 2021-10-13 MED FILL — STIOLTO RESPIMAT 2.5 MCG-2.5 MCG/ACTUATION SOLUTION FOR INHALATION: RESPIRATORY_TRACT | 30 days supply | Qty: 4 | Fill #7

## 2021-10-13 MED FILL — IPRATROPIUM BROMIDE 42 MCG (0.06 %) NASAL SPRAY: NASAL | 21 days supply | Qty: 15 | Fill #1

## 2021-10-14 ENCOUNTER — Ambulatory Visit: Admit: 2021-10-14 | Discharge: 2021-10-15 | Payer: MEDICARE | Attending: Family | Primary: Family

## 2021-10-14 DIAGNOSIS — M25511 Pain in right shoulder: Principal | ICD-10-CM

## 2021-10-14 DIAGNOSIS — G8929 Other chronic pain: Principal | ICD-10-CM

## 2021-10-14 DIAGNOSIS — B20 Human immunodeficiency virus [HIV] disease: Secondary | ICD-10-CM | POA: Diagnosis not present

## 2021-10-14 DIAGNOSIS — Z6825 Body mass index (BMI) 25.0-25.9, adult: Secondary | ICD-10-CM | POA: Diagnosis not present

## 2021-10-14 LAB — CBC W/ AUTO DIFF
BASOPHILS ABSOLUTE COUNT: 0 10*9/L (ref 0.0–0.1)
BASOPHILS RELATIVE PERCENT: 0.9 %
EOSINOPHILS ABSOLUTE COUNT: 0.2 10*9/L (ref 0.0–0.5)
EOSINOPHILS RELATIVE PERCENT: 3.1 %
HEMATOCRIT: 39.5 % (ref 39.0–48.0)
HEMOGLOBIN: 13.6 g/dL (ref 12.9–16.5)
LYMPHOCYTES ABSOLUTE COUNT: 1.1 10*9/L (ref 1.1–3.6)
LYMPHOCYTES RELATIVE PERCENT: 19.5 %
MEAN CORPUSCULAR HEMOGLOBIN CONC: 34.5 g/dL (ref 32.0–36.0)
MEAN CORPUSCULAR HEMOGLOBIN: 36 pg — ABNORMAL HIGH (ref 25.9–32.4)
MEAN CORPUSCULAR VOLUME: 104.3 fL — ABNORMAL HIGH (ref 77.6–95.7)
MEAN PLATELET VOLUME: 7.7 fL (ref 6.8–10.7)
MONOCYTES ABSOLUTE COUNT: 0.4 10*9/L (ref 0.3–0.8)
MONOCYTES RELATIVE PERCENT: 7.9 %
NEUTROPHILS ABSOLUTE COUNT: 3.9 10*9/L (ref 1.8–7.8)
NEUTROPHILS RELATIVE PERCENT: 68.6 %
NUCLEATED RED BLOOD CELLS: 0 /100{WBCs} (ref <=4)
PLATELET COUNT: 141 10*9/L — ABNORMAL LOW (ref 150–450)
RED BLOOD CELL COUNT: 3.78 10*12/L — ABNORMAL LOW (ref 4.26–5.60)
RED CELL DISTRIBUTION WIDTH: 12.4 % (ref 12.2–15.2)
WBC ADJUSTED: 5.6 10*9/L (ref 3.6–11.2)

## 2021-10-14 LAB — LYMPH MARKER LIMITED,FLOW
ABSOLUTE CD3 CNT: 902 {cells}/uL — ABNORMAL LOW (ref 915–3400)
ABSOLUTE CD4 CNT: 429 {cells}/uL — ABNORMAL LOW (ref 510–2320)
ABSOLUTE CD8 CNT: 473 {cells}/uL (ref 180–1520)
CD3% (T CELLS): 82 % (ref 61–86)
CD4% (T HELPER): 39 % (ref 34–58)
CD4:CD8 RATIO: 0.9 (ref 0.9–4.8)
CD8% T SUPPRESR: 43 % — ABNORMAL HIGH (ref 12–38)

## 2021-10-14 LAB — BASIC METABOLIC PANEL
ANION GAP: 8 mmol/L (ref 5–14)
BLOOD UREA NITROGEN: 13 mg/dL (ref 9–23)
BUN / CREAT RATIO: 12
CALCIUM: 10 mg/dL (ref 8.7–10.4)
CHLORIDE: 104 mmol/L (ref 98–107)
CO2: 26.3 mmol/L (ref 20.0–31.0)
CREATININE: 1.07 mg/dL
EGFR CKD-EPI (2021) MALE: 76 mL/min/{1.73_m2} (ref >=60)
GLUCOSE RANDOM: 113 mg/dL — ABNORMAL HIGH (ref 70–99)
POTASSIUM: 3.7 mmol/L (ref 3.4–4.8)
SODIUM: 138 mmol/L (ref 135–145)

## 2021-10-14 LAB — BILIRUBIN, TOTAL: BILIRUBIN TOTAL: 0.7 mg/dL (ref 0.3–1.2)

## 2021-10-14 LAB — ALT: ALT (SGPT): 15 U/L (ref 10–49)

## 2021-10-14 LAB — AST: AST (SGOT): 20 U/L (ref <=34)

## 2021-10-14 MED ORDER — DESCOVY 200 MG-25 MG TABLET
ORAL_TABLET | Freq: Every day | ORAL | 5 refills | 30 days | Status: CP
Start: 2021-10-14 — End: ?
  Filled 2021-10-13: qty 30, 30d supply, fill #5
  Filled 2021-11-11: qty 30, 30d supply, fill #0

## 2021-10-14 MED ORDER — TIVICAY 50 MG TABLET
ORAL_TABLET | Freq: Every day | ORAL | 5 refills | 30.00000 days | Status: CP
Start: 2021-10-14 — End: ?
  Filled 2021-10-13: qty 30, 30d supply, fill #5
  Filled 2021-11-11: qty 30, 30d supply, fill #0

## 2021-10-14 NOTE — Unmapped (Signed)
[facility]  [address]  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 Current regimen: Descovy (FTC/TAF) and dolutegravir  Misses doses of ARVs never Denies any missed doses since last visit.    Med access through insurance  CD4 count 300's  Discussed ARV adherence, continuing prophylaxis for OI and taking ARVs with food    Lab Results   Component Value Date    ACD4 288 (L) 07/12/2021    CD4 32 (L) 07/12/2021    HIVRS Not Detected 07/12/2021    HIVCP 258 (H) 03/02/2021     ??? CD4, HIV RNA, and safety labs (full return panel)  ??? Continue current therapy. On BIktarvy  ??? Discussed importance of ARV adherence      Right shoulder pain - chronic, acutely exacerbated  Has chronic right shoulder pain over last 2 years. Pain has worsened the last two months or so. Manageable. Takes aspirin to manage pain with relief.  Had left rotator cuff surgery some years ago but post op recovery was difficult and he does not want to have right shoulder surgery at this time.  ?? Patient states he had not addressed this with his PCP. Advised patient to see PCP and discuss with them. It is possible that patient may benefit from physical therapy in the mean time to help avoid surgery and further injury to shoulder.  ?? Patient is amenable to this and will make an appointment.      Recent presumptive treatment for PJP - acute, resolved  Sore throat, cough, chills, night sweats, decreased appetite, has really dry mouth x 4 weeks   Took full course of Atovaquone 750mg  BID x 21 days and now taking 1500mg  daily for OI prophylaxis.  Has better appetite, put on 3#. Has some wheezing at night but has been without Stiolto and albuterol.   Roommate diagnosed with CoVid at same time as his URI symptoms(03/01/21), patient tested negative for CoVid multiple times  ?? Taking Descovy/Tivicay consistently  ?? No longer taking Atovaquone.  ?? Will obtain another CD4 count today       Chronic hepatitis C (RAF-HCC)/ HCC screening - chronic, stable  Genotype 1a, Grade 1, stage II disease by liver biopsy 01/31/2003  Previously treated in 07/28/2003 to 01/20/2004 at [facility] requiring early discontinuation secondary to significant side effects including what was thought to be interstitial pneumonitis. While on therapy required addition of both Neupogen and Procrit secondary to medication-induced cytopenias.  Treated again in 02/28/15 with Sofosbuvir and daclastasvir x 12 weeks   Followed by Hepatology, Dr. Arnette Felts FNP, last seen 11/25/19  12/02/2019 MRI of abdomen: Cirrhotic hepatic morphology with mild splenomegaly (14.3cm). No ascites. No suspicious hepatic lesions.  ?? 07/12/21 Hep C RNA today  ?? Continue follow up with Hepatology, now overdue. Stressed importance with patient to call for appointment again. He states they have their number and intends to call for an appointment.      Aortic root dilation (RAF-HCC) - chronic  Previously followed by Dr. Andrey Farmer with [facility], last seen 09/17/2015  Negative genetic testing for Marfan's Syndrome  09/30/15 Echocardiogram performed EF>55%, mild mitral prolapse and root dilation  ?? Continue Losartan for HTN, at goal today  ??  Now that patient has established primary care in [place], will defer management to PCP.  ??    COPD (chronic obstructive pulmonary disease) - chronic  Manages by Dr. Laverle Patter, seeing her today  On Stiolto and albuterol  12/15/20 CT Lung Cancer screening shows:   --0.4 cm right middle lobe nodule is unchanged in appearance since 04/25/13.??  --0.3 cm left apical nodule is new since 12/02/2019.  --Severe emphysema.  Lung-RADS Category: 2  Follow-up Recommendation: Follow Up LDCT in 1 Year (12/2021)  ?? Known history of distant heart tones. Previous Chest x-ray 05/03/21 unremarkable. Lung findings of emphysema with no acute cardiopulmonary abnormality.  ?? There is no scheduled follow up with Dr. Sallyanne Kuster as of now. He was amenable to making appointment with Dr. Sallyanne Kuster for 12/13/21.      Mental Health  Followed by [facility]  Recently feeling lonely, he's not sure this has to do with the winter as well.  His sister passed away in 2016/04/25, then her husband passed 25-Apr-2018, and recently he has some close people to him pass away as well. And I'm still here.  He believes this contributed to him not trying to restart ART.  ?? Mood improved, feeling good overall.   ?? Currently on Lamictal 200mg  daily and Abilify 2mg  daily      History of chronic renal insufficiency  Stable Scr 1.0-1.16      Nicotine dependence  - chronic, stable  ??? Revisit readiness at next appt.      Monkeypox Education  ??? Discussed current outbreak, risk factors, lesion recognition, and clinical signs/symptoms including fever, headache, myalgias, and fatigue followed a few days later by a rash. Discussed varied appearance of rash including stages of evolution.   ??? Shared relevant information and vaccine access. Discussed that monkeypox vaccines are free and are based on availability of vaccine at local health depts and at our [facility] ID clinic.  ??? Declines MPX vaccine today. Will consider for future visit.      Sexual health & secondary prevention  - chronic, stable  Not in relationship. Has not had sex since last visit.   Parts of body used during sex include: anus/rectum, mouth and penis. Versatile for anal sex. Gives and receives oral sex. Does not have vaginal sex.   Since last visit has had UNKNOWN sex and has not had add'l STI screening.  He does routinely discuss HIV status with partner(s).  Have not discussed interest in having children.    Lab Results   Component Value Date    RPR Nonreactive 07/12/2021    RPR Nonreactive 01/20/2021    CTNAA Negative 03/02/2021    CTNAA Negative 01/20/2021    CTNAA Negative 01/20/2021 GCNAA Negative 03/02/2021    GCNAA Negative 01/20/2021    GCNAA Negative 01/20/2021    SPECSOURCE Urine 03/02/2021    SPECSOURCE Throat 01/20/2021    SPECSOURCE Rectum 01/20/2021     ??? GC/CT NAATs - not being checked routinely for this patient  ??? RPR - repeat 12 months after prior      Health maintenance  - chronic, stable  PCP: Jodi Marble, AGNP at [facility].    Oral health  He does not have a dentist. Last dental exam unknown.    Eye health  He does  use corrective lenses. Last eye exam unknown.    Metabolic conditions  Wt Readings from Last 5 Encounters:   10/14/21 95.9 kg (211 lb 6.4 oz)   09/03/21 95.4  kg (210 lb 4.8 oz)   07/15/21 96.2 kg (212 lb)   07/12/21 95.3 kg (210 lb)   07/12/21 95.6 kg (210 lb 12.8 oz)     Lab Results   Component Value Date    CREATININE 1.00 07/12/2021    PROTEINUR 58.0 03/25/2020    PCRATIOUR 306 03/25/2020    GLU 118 07/12/2021    A1C 4.8 07/12/2021    ALT 15 07/12/2021    ALT 17 05/03/2021    ALT 24 03/02/2021    VITDTOTAL 26.3 11/25/2019     # Kidney health - creatinine today  # Bone health - Last DEXA 07/2018  and FRAX score Major osteoporotic 6.1%/Hip Fracture 1.3% on this date: 05/04/2021 T score -1.0, repeat in 5 years 04/2026  # Diabetes assessment - random glucose today  # NAFLD assessment - Needs to follow back up with Hepatology    Communicable diseases  Lab Results   Component Value Date    QFTTBGOLD Negative 07/12/2021    HEPAIGG Reactive (A) 05/21/2015    HEPBSAB Reactive 01/10/2015    HEPCAB Positive (A) 01/10/2015    HCVRNA Not Detected 07/12/2021    HCVRNAIU 16 03/19/2015    HCVIU [id number] 07/29/2014     # TB screening - assessment needed but deferred to future visit  # Hepatitis screening - Treated  # MMR screening - not assessed    Cancer screening  Lab Results   Component Value Date    PSA 0.22 07/12/2021    FINALDX  10/12/2018     A: Stomach, biopsy  - Gastric fundic and antral mucosa with chronic superficial gastritis  - No Helicobacter pylori identified on H&E stain    This electronic signature is attestation that the pathologist personally reviewed the submitted material(s) and the final diagnosis reflects that evaluation.       # Anorectal - not yet done  # Colorectal - Has been ordered but patient's sister not available to take him till August.  # Liver - Defer to Hepatology  # Lung - repeat low-dose CT 52m from prior  # Prostate - Repeat in 2 years, due 06/2023    Cardiovascular disease  Lab Results   Component Value Date    CHOL 123 03/25/2020    HDL 66 03/25/2020    LDL 41 03/25/2020    NONHDL 103 10/04/2011    TRIG 79 03/25/2020     # The 10-year ASCVD risk score (Arnett DK, et al., 2019) is: 14.8%  - is not taking aspirin   - is taking statin  - BP control fair  - current smoker  # AAA screening - ultrasound ordered    Immunization History   Administered Date(s) Administered   ??? COVID-19 VAC,BIVALENT(20YR UP)BOOST,PFIZER 07/12/2021   ??? COVID-19 VACC,MRNA(BOOSTER)OR(6-55YR)MODERNA 03/18/2021   ??? COVID-19 VACCINE,MRNA(MODERNA)(PF) 12/12/2019, 01/07/2020, 09/03/2020   ??? Hepatitis B, Adult 04/17/2012, 07/17/2012, 01/29/2013   ??? INFLUENZA TIV (TRI) PF (IM) 08/21/2007, 07/29/2008, 09/29/2009, 07/06/2010, 10/04/2011, 07/17/2012, 08/20/2013   ??? Influenza Vaccine Quad (IIV4 PF) 27mo+ injectable 07/29/2014, 08/20/2015, 06/28/2016, 08/02/2017, 07/31/2018, 10/22/2019, 07/06/2020, 07/12/2021   ??? Influenza Virus Vaccine, unspecified formulation 07/29/2014, 08/20/2015, 06/28/2016, 08/02/2017   ??? Novel Influenza-h1n1-09, All Formulations 11/04/2008   ??? PNEUMOCOCCAL POLYSACCHARIDE 23 11/25/2003, 02/17/2009, 01/12/2016   ??? Pneumococcal Conjugate 13-Valent 10/23/2012   ??? TdaP 02/22/2006, 06/19/2013     Immunizations today - Shingrix needed, prefers to obtain with PCP.    Counseling services took more than 50% of today's visit.  I  personally spent 35 minutes face-to-face and non-face-to-face in the care of this patient, which includes all pre, intra, and post visit time on the date of service.      Disposition  Next appointment: 5-6 months      To do @ next RTC  ??? Still needs to see Hepatology  ??? Shingrix with PCP    Varney Daily, FNP-BC  [facility] at [facility]  [address] [place], [place] [address]    Phone: [phone]   Fax: [phone]             Subjective      Chief Complaint   HIV follow up    HPI  In addition to details in A&P above:  ?? No missed ART doses.  Denies any fever, chills, nausea, vomiting, rash, urinary complaints, diarrhea, constipation.    Right shoulder   ?? Had left shoulder surgery in 2016-2017  ?? The last couple of years has worsened, does not want surgery at this time, has gotten worse in last couple of months  ?? Use aspirin sometimes for pain relief.      Past Medical History:   Diagnosis Date   ??? Chronic renal insufficiency 06/12/2013   ??? COPD (chronic obstructive pulmonary disease) (CMS-HCC) 01/29/2013   ??? Emphysema of lung (CMS-HCC)    ??? Hepatitis C    ??? Human immunodeficiency virus (HIV) disease (CMS-HCC) 01/29/2013   ??? Hypercholesteremia 01/29/2013   ??? Reflux    ??? Vomiting     after eating         Social History  Background - Lives with roommate, has been socializing a little bit more    Housing - in house with roommate  School / Work & Benefits - on [employer]    Social History     Tobacco Use   ??? Smoking status: Every Day     Packs/day: 0.50     Years: 43.00     Pack years: 21.50     Types: Cigarettes     Start date: 01/29/1970   ??? Smokeless tobacco: Never   ??? Tobacco comments:     10 cigarettes/day   Vaping Use   ??? Vaping Use: Never used   Substance Use Topics   ??? Alcohol use: Yes     Alcohol/week: 0.0 standard drinks     Comment: sunday ~ 2 mixed drinks (vodka)    ??? Drug use: Yes     Frequency: 2.0 times per week     Types: Marijuana     Comment: weekly use ~ 2 times week       Review of Systems  As per HPI. All others negative.      Medications and Allergies  He has a current medication list which includes the following prescription(s): albuterol, aripiprazole, atorvastatin, atovaquone, tivicay, descovy, ipratropium, lamotrigine, losartan, naproxen sodium, stiolto respimat, trazodone, and fluticasone propionate.    Allergies: Pollen extracts and Sulfa (sulfonamide antibiotics)      Family History  His family history includes Hepatitis in his sister; Parkinsonism in his sister; Rheumatologic disease in his sister. He was adopted.          Objective:      BP 130/72 (BP Site: L Arm, BP Position: Sitting)  - Pulse 88  - Temp 36.4 ??C (97.5 ??F) (Temporal)  - Ht 195.6 cm (6' 5)  - Wt 95.9 kg (211 lb 6.4 oz)  - BMI 25.07 kg/m??   Wt Readings from Last  3 Encounters:   10/14/21 95.9 kg (211 lb 6.4 oz)   09/03/21 95.4 kg (210 lb 4.8 oz)   07/15/21 96.2 kg (212 lb)       Const looks well and attentive, alert, appropriate   Eyes sclerae anicteric, noninjected OU   ENT masked   Lymph no cervical or supraclavicular LAD   CV RRR. No murmurs. No rub or gallop. S1/S2. Very distant heart tones, able to auscultate faint heart sounds when patient lying on left side   Lungs CTAB ant/post, normal work of breathing   GI Soft, no organomegaly. NTND. NABS.   GU deferred   Rectal deferred   Skin no petechiae, ecchymoses or obvious rashes on clothed exam   MSK no joint tenderness and normal ROM throughout   Neuro grossly intact   Psych Appropriate affect. Eye contact good. Linear thoughts. Fluent speech.     Laboratory Data  Reviewed in Epic today, using Synopsis and Chart Review filters.    Lab Results   Component Value Date    CREATININE 1.00 07/12/2021    QFTTBGOLD Negative 07/12/2021    HEPCAB Positive (A) 01/10/2015    HCVRNA Not Detected 07/12/2021    HCVRNAIU 16 03/19/2015    HCVIU [id number] 07/29/2014    CHOL 123 03/25/2020    HDL 66 03/25/2020    LDL 41 03/25/2020    NONHDL 103 10/04/2011    TRIG 79 03/25/2020    PSA 0.22 07/12/2021    A1C 4.8 07/12/2021    FINALDX  10/12/2018     A: Stomach, biopsy  - Gastric fundic and antral mucosa with chronic superficial gastritis  - No Helicobacter pylori identified on H&E stain    This electronic signature is attestation that the pathologist personally reviewed the submitted material(s) and the final diagnosis reflects that evaluation.

## 2021-10-15 LAB — HIV RNA, QUANTITATIVE, PCR
HIV RNA QNT RSLT: DETECTED — AB
HIV RNA: <40 {copies}/mL — ABNORMAL HIGH (ref <0)

## 2021-11-04 DIAGNOSIS — B20 Human immunodeficiency virus [HIV] disease: Principal | ICD-10-CM

## 2021-11-04 MED ORDER — ARIPIPRAZOLE 5 MG TABLET
ORAL_TABLET | Freq: Every day | ORAL | 0 refills | 90.00000 days
Start: 2021-11-04 — End: ?

## 2021-11-05 MED ORDER — ARIPIPRAZOLE 5 MG TABLET
ORAL_TABLET | Freq: Every day | ORAL | 0 refills | 90 days | Status: CP
Start: 2021-11-05 — End: ?
  Filled 2021-11-05: qty 90, 90d supply, fill #0

## 2021-11-05 NOTE — Unmapped (Signed)
[facility] Clinical Assessment & Refill Coordination Note    Andrew Oconnell, DOB: 1956-08-02  Phone: [phone] (home)     All above HIPAA information was verified with patient.     Was a Nurse, learning disability used for this call? No    Specialty Medication(s):   Infectious Disease: Descovy and Tivicay     Current Outpatient Medications   Medication Sig Dispense Refill   ??? albuterol HFA 90 mcg/actuation inhaler Inhale 1 puff every six (6) hours as needed for wheezing or shortness of breath. 8.5 g 0   ??? ARIPiprazole (ABILIFY) 5 MG tablet Take 1 tablet (5 mg total) by mouth daily. 90 tablet 0   ??? atorvastatin (LIPITOR) 10 MG tablet Take 1 tablet (10 mg total) by mouth daily. 90 tablet 1   ??? dolutegravir (TIVICAY) 50 mg TABLET Take 1 tablet (50 mg total) by mouth daily. 30 tablet 5   ??? emtricitabine-tenofovir alafen (DESCOVY) 200-25 mg tablet Take 1 tablet by mouth daily. 30 tablet 5   ??? fluticasone (FLONASE) 50 mcg/actuation nasal spray 1 spray by Each Nare route daily. 16 g 0   ??? ipratropium (ATROVENT) 42 mcg (0.06 %) nasal spray Use 2 sprays into each nostril four (4) times a day. 15 mL 2   ??? lamoTRIgine (LAMICTAL) 200 MG tablet Take 1 tablet (200 mg total) by mouth daily. 90 tablet 0   ??? losartan (COZAAR) 25 MG tablet Take 1 tablet (25 mg total) by mouth daily. 90 tablet 1   ??? naproxen sodium (ALEVE) 220 MG tablet Take 220 mg by mouth daily as needed for pain.     ??? tiotropium-olodateroL (STIOLTO RESPIMAT) 2.5-2.5 mcg/actuation Mist Inhale 2 puffs daily. 4 g 11   ??? traZODone (DESYREL) 100 MG tablet Take 2.5 tablets (250 mg total) by mouth nightly. 250 tablet 0     No current facility-administered medications for this visit.        Changes to medications: Andrew Oconnell reports no changes at this time.    Allergies   Allergen Reactions   ??? Pollen Extracts      Congestion     ??? Sulfa (Sulfonamide Antibiotics) Rash       Changes to allergies: No    SPECIALTY MEDICATION ADHERENCE     Descovy 200-25 mg: 18 days of medicine on hand   Tivicay 50 mg: 18 days of medicine on hand       Medication Adherence    Patient reported X missed doses in the last month: 0  Specialty Medication: Descovy 200-25mg   Patient is on additional specialty medications: Yes  Additional Specialty Medications: Tivicay 50mg   Patient Reported Additional Medication X Missed Doses in the Last Month: 0  Patient is on more than two specialty medications: No  Any gaps in refill history greater than 2 weeks in the last 3 months: no  Demonstrates understanding of importance of adherence: yes  Informant: patient  Provider-estimated medication adherence level: good  Patient is at risk for Non-Adherence: No  Confirmed plan for next specialty medication refill: delivery by pharmacy  Refills needed for supportive medications: yes, ordered or provider notified          Specialty medication(s) dose(s) confirmed: Patient reports changes to the regimen as follows: discontinued atovaquone     Are there any concerns with adherence? No    Adherence counseling provided? Not needed    CLINICAL MANAGEMENT AND INTERVENTION      Clinical Benefit Assessment:  Do you feel the medicine is effective or helping your condition? Yes    HIV ASSOCIATED LABS:     Lab Results   Component Value Date/Time    HIVRS Detected (A) 10/14/2021 10:09 AM    HIVRS Not Detected 07/12/2021 10:16 AM    HIVRS Not Detected 05/03/2021 11:24 AM    HIVRS <40 08/24/2017 12:00 AM    HIVRS Not Detected 07/29/2014 12:50 PM    HIVRS Not Detected 02/18/2014 11:42 AM    HIVCP <40 (H) 10/14/2021 10:09 AM    HIVCP 258 (H) 03/02/2021 11:32 AM    HIVCP 61,184 (H) 01/20/2021 12:16 PM    HIVCP <40 copies/mL 03/25/2020 12:00 AM    HIVCP <40 11/29/2016 12:00 AM    HIVCP <40 12/01/2015 12:00 AM    RCD4 36.1 03/25/2020 12:00 AM    RCD4 31.5 06/28/2016 12:00 AM    RCD4 29.6 12/01/2015 12:00 AM    ACD4 429 (L) 10/14/2021 10:09 AM    ACD4 288 (L) 07/12/2021 10:16 AM    ACD4 385 (L) 05/03/2021 11:24 AM    ACD4 433 (A) 03/25/2020 12:00 AM    ACD4 630 06/28/2016 12:00 AM    ACD4 710 12/01/2015 12:00 AM       Clinical Benefit counseling provided? Labs from 10/14/21 show evidence of clinical benefit    Adverse Effects Assessment:    Are you experiencing any side effects? No    Are you experiencing difficulty administering your medicine? No    Quality of Life Assessment:    How many days over the past month did your HIV  keep you from your normal activities? For example, brushing your teeth or getting up in the morning. 0    Have you discussed this with your provider? Not needed    Acute Infection Status:    Acute infections noted within Epic:  No active infections  Patient reported infection: None    Therapy Appropriateness:    Is therapy appropriate and patient progressing towards therapeutic goals? Yes, therapy is appropriate and should be continued    DISEASE/MEDICATION-SPECIFIC INFORMATION      N/A    PATIENT SPECIFIC NEEDS     - Does the patient have any physical, cognitive, or cultural barriers? No    - Is the patient high risk? No    - Does the patient require a Care Management Plan? No         SHIPPING     Specialty Medication(s) to be Shipped:   Infectious Disease: Descovy and Tivicay    Other medication(s) to be shipped: ipratropium 0.06% nasal spray, Stiolto Respimat 2.5-2.1mcg/actuation, aripriprazole 5mg   :needs new Rx: sent request     Changes to insurance: No    Delivery Scheduled: Yes, Expected medication delivery date: Descovy, Tivicay, Stiolto, ipratropium : 11/12/21 AND aripiprazole: 11/08/21.     Medication will be delivered via UPS to the confirmed prescription address in [facility].    The patient will receive a drug information handout for each medication shipped and additional FDA Medication Guides as required.  Verified that patient has previously received a Conservation officer, historic buildings and a Surveyor, mining.    The patient or caregiver noted above participated in the development of this care plan and knows that they can request review of or adjustments to the care plan at any time.      All of the patient's questions and concerns have been addressed.    [name]   [facility] Pharmacy Specialty Pharmacist

## 2021-11-11 MED FILL — STIOLTO RESPIMAT 2.5 MCG-2.5 MCG/ACTUATION SOLUTION FOR INHALATION: RESPIRATORY_TRACT | 30 days supply | Qty: 4 | Fill #8

## 2021-11-11 MED FILL — IPRATROPIUM BROMIDE 42 MCG (0.06 %) NASAL SPRAY: NASAL | 21 days supply | Qty: 15 | Fill #2

## 2021-11-29 MED ORDER — TRAZODONE 100 MG TABLET
ORAL_TABLET | Freq: Every evening | ORAL | 0 refills | 100.00000 days
Start: 2021-11-29 — End: ?

## 2021-12-02 MED ORDER — TRAZODONE 100 MG TABLET
ORAL_TABLET | Freq: Every evening | ORAL | 0 refills | 100.00000 days | Status: CP
Start: 2021-12-02 — End: ?
  Filled 2021-12-02: qty 225, 90d supply, fill #0

## 2021-12-02 MED FILL — ATORVASTATIN 10 MG TABLET: ORAL | 90 days supply | Qty: 90 | Fill #1

## 2021-12-02 MED FILL — LOSARTAN 25 MG TABLET: ORAL | 90 days supply | Qty: 90 | Fill #1

## 2021-12-02 NOTE — Unmapped (Addendum)
-----   Message from Franki Cabot sent at 12/01/2021 11:57 AM EST -----  Medication Refill, Question, or Clarification  Caller: Andrew Oconnell  Best callback number: [phone]  Type of request (refill, question, etc): refill  Name of medication: Trazadone  Desired pharmacy:    [facility]  [address]  [place] [address]  Phone: [phone] Fax: [phone]    Does patient request a callback from a nurse to discuss?: Please give you a call when this has been completed    12/02/2021 Called patient to notify him that Dr. Derrill Kay had taken care of his Rx request for traZODone (DESYREL) 100 MG tablet

## 2021-12-08 MED ORDER — IPRATROPIUM BROMIDE 42 MCG (0.06 %) NASAL SPRAY
Freq: Four times a day (QID) | NASAL | 2 refills | 21 days
Start: 2021-12-08 — End: 2022-12-08

## 2021-12-08 NOTE — Unmapped (Signed)
[facility] Special[facility] Pharmacy Refill Coordination Note    Specialty Medication(s) to be Shipped:   Infectious Disease: Descovy and Tivicay    Other medication(s) to be shipped: ipratropium nasal spray (faxed for refills), stiolto respimat 2.5-2.39mcg inhaler     Andrew Oconnell, DOB: 11/27/1955  Phone: [phone] (home)       All above HIPAA information was verified with patient.     Was a Nurse, learning disability used for this call? No    Completed refill call assessment today to schedule patient's medication shipment from the [facility] [phone]).  All relevant notes have been reviewed.     Specialty medication(s) and dose(s) confirmed: Regimen is correct and unchanged.   Changes to medications: Andrew Oconnell reports no changes at this time.  Changes to insurance: No  New side effects reported not previously addressed with a pharmacist or physician: None reported  Questions for the pharmacist: No    Confirmed patient received a Conservation officer, historic buildings and a Surveyor, mining with first shipment. The patient will receive a drug information handout for each medication shipped and additional FDA Medication Guides as required.       DISEASE/MEDICATION-SPECIFIC INFORMATION        N/A    SPECIALTY MEDICATION ADHERENCE     Medication Adherence    Patient reported X missed doses in the last month: 0  Specialty Medication: Descovy  Patient is on additional specialty medications: Yes  Additional Specialty Medications: Tivicay  Patient Reported Additional Medication X Missed Doses in the Last Month: 0  Patient is on more than two specialty medications: No  Informant: patient              Were doses missed due to medication being on hold? No    Descovy 200-25 mg: 12 days of medicine on hand   Tivicay 50 mg: 12 days of medicine on hand     REFERRAL TO PHARMACIST     Referral to the pharmacist: Not needed      [facility]     Shipping address confirmed in Epic.     Delivery Scheduled: Yes, Expected medication delivery date: 12/17/21.     Medication will be delivered via UPS to the prescription address in Epic [facility].    [name]   [facility] Specialty Technician

## 2021-12-09 MED ORDER — IPRATROPIUM BROMIDE 42 MCG (0.06 %) NASAL SPRAY
Freq: Four times a day (QID) | NASAL | 2 refills | 21 days | Status: CP
Start: 2021-12-09 — End: 2022-12-09
  Filled 2021-12-16: qty 15, 21d supply, fill #0

## 2021-12-13 ENCOUNTER — Ambulatory Visit: Admit: 2021-12-13 | Discharge: 2021-12-14 | Payer: MEDICARE

## 2021-12-13 DIAGNOSIS — Z129 Encounter for screening for malignant neoplasm, site unspecified: Principal | ICD-10-CM

## 2021-12-13 DIAGNOSIS — Z720 Tobacco use: Secondary | ICD-10-CM | POA: Diagnosis not present

## 2021-12-13 DIAGNOSIS — J029 Acute pharyngitis, unspecified: Secondary | ICD-10-CM | POA: Diagnosis not present

## 2021-12-13 DIAGNOSIS — J449 Chronic obstructive pulmonary disease, unspecified: Secondary | ICD-10-CM | POA: Diagnosis not present

## 2021-12-13 NOTE — Unmapped (Signed)
HIV-PULMONARY CLINIC VISIT:    Patient: Andrew Oconnell([birth year])   Reason for visit: COPD    HISTORY OF PRESENT ILLNESS:     Andrew Oconnell is a 66 year old gentleman with a PMH as below including HIV on ART, tobacco use and COPD.     HISTORY OF PRESENT ILLNESS (06/2020):   Andrew Oconnell reports that he was first diagnosed with COPD in 2005. He had previously been seeing a pulmonologist in [place] and has been on Advair with prn albuterol for a few years though he ran out of the Advair a few months ago. He reports that he has had some dyspnea on exertion for the past few months and reports an mMRC score of 2 today. He uses his rescue inhaler 2-3 times per week. He denies any cough but does endorse morning sputum production that is typically clear and never has blood. He denies any wheezing or chest tightness. He has chronic rhinosinusitis, for which he follows with ENT - currently managed with sinus rinses and oral antihistamines. He denies any fevers or chills and reports occasional mild night sweats that have been stable for years. He reports weight gain of 3 pounds in the last month. He denies any lower extremity swelling. He has not required antibiotics or oral steroids in the last 2-3 years for a COPD exacerbation.    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 He reports that his respiratory symptoms remain very well controlled. His mMRC score today is 1. He reports minimal cough and no sputum production. He denies any wheezing, chest pain, chest tightness, fevers or chills. He has been fully compliant with his Stiolto and uses his albuterol rescue inhaler 1-2 times a week. He continues to have his chronic rhinosinusitis issues and was never scheduled for an appointment with ENT. He currently takes Allegra as needed. He no longer uses Flonase because it did not help at all. He tried sinus irrigation previously but could not tolerate it.      INTERVAL HISTORY (11/2021):  Andrew Oconnell reports doing overall well since his last visit. He does report nearly 2 weeks of sore throat, low grade fevers (as high as 100), cough with clear/yellowish sputum production, and clear rhinorrhea. He has been using aspirin for these symptoms and has not had any worsening of his breathing with this. Aside from these acute symptoms, he denies any increased dyspnea on exertion, regular cough, wheezing, chest tightness, chest pain, sinus headache or sinus pressure. His mMRC score is 1 today and he is presently using his albuterol inhaler 1-2 times a month, with relief. He saw ENT in November who prescribed nasal hygeine - he has been doing the nasal spray mostly consistently.     Medication Adherence: Adherent all of the time.  Inhaler Technique: Using inhaler properly.    Pulmonary medications: Stiolto, prn Albuterol    HIV status:  Date: CD4 Viral load   10/2018 543 ND   10/2019 593 ND   03/2020  <40   04/2021 385 ND   09/2021 429 <40     Current regimen: Tivicay and Descovy    REVIEW OF SYSTEMS: All systems were reviewed and found to be negative except as above in the HPI.    PAST MEDICAL HISTORY:     MEDICAL/SURGICAL HISTORY:  Past Medical History:   Diagnosis Date   ??? Chronic renal insufficiency 06/12/2013   ???  COPD (chronic obstructive pulmonary disease) (CMS-HCC) 01/29/2013   ??? Emphysema of lung (CMS-HCC)    ??? Hepatitis C    ??? Human immunodeficiency virus (HIV) disease (CMS-HCC) 01/29/2013   ??? Hypercholesteremia 01/29/2013   ??? Reflux    ??? Vomiting     after eating     Past Surgical History:   Procedure Laterality Date   ??? BACK SURGERY N/A 1990   ??? LIGATION / DIVISION SAPHENOUS VEIN Bilateral     90s   ??? PR COLSC FLX W/RMVL OF TUMOR POLYP LESION SNARE TQ N/A 05/08/2015    Procedure: COLONOSCOPY FLEX; W/REMOV TUMOR/LES BY SNARE;  Surgeon: Teodoro Spray, MD;  [facility];  Service: Gastroenterology   ??? PR ESOPHAGEAL MOTILITY STUDY, MANOMETRY N/A 06/04/2014    Procedure: ESOPHAGEAL MOTILITY STUDY W/INT & REP;  Surgeon: Nurse-Based Giproc;  [facility];  Service: Gastroenterology   ??? PR REPAIR BICEPS LONG TENDON Left 03/11/2016    Procedure: TENODESIS LONG TENDON BICEPS;  Surgeon: Tomasa Rand, MD;  Location: ASC OR [facility];  Service: Orthopedics   ??? PR SHLDR ARTHROSCOP,SURG,W/ROTAT CUFF REPR Left 03/11/2016    Procedure: ARTHROSCOPY, SHOULDER, SURGICAL; WITH ROTATOR CUFF REPAIR;  Surgeon: Tomasa Rand, MD;  Location: ASC OR [facility];  Service: Orthopedics   ??? PR SURGICAL ARTHROSCOPY SHOULDER XTNSV DBRDMT 3+ Left 03/11/2016    Procedure: ARTHROSCOPY SHOULDER SURG; DEBRID EXTEN;  Surgeon: Tomasa Rand, MD;  Location: ASC OR [facility];  Service: Orthopedics   ??? PR UPPER GI ENDOSCOPY,BIOPSY N/A 05/20/2014    Procedure: UGI ENDOSCOPY; WITH BIOPSY, SINGLE OR MULTIPLE;  Surgeon: Donneta Romberg, MD;  [facility];  Service: Gastroenterology   ??? PR UPPER GI ENDOSCOPY,BIOPSY N/A 10/12/2018    Procedure: UGI ENDOSCOPY; WITH BIOPSY, SINGLE OR MULTIPLE;  Surgeon: Janyth Pupa, MD;  Location: [facility];  Service: Gastroenterology   ??? PR UPPER GI ENDOSCOPY,DIAGNOSIS N/A 11/06/2015    Procedure: UGI ENDO, INCLUDE ESOPHAGUS, STOMACH, & DUODENUM &/OR JEJUNUM; DX W/WO COLLECTION SPECIMN, BY BRUSH OR WASH;  Surgeon: Rona Ravens, MD;  [facility]: [facility];  Service: Gastroenterology   ??? PR UPPER GI ENDOSCOPY,DIAGNOSIS N/A 01/31/2020    Procedure: UGI ENDO, INCLUDE ESOPHAGUS, STOMACH, & DUODENUM &/OR JEJUNUM; DX W/WO COLLECTION SPECIMN, BY BRUSH OR WASH;  Surgeon: Annie Paras, MD;  [facility];  Service: Gastroenterology       SOCIAL AND FAMILY HISTORY:   Social history: He lives at home with a friend and they have a Development worker, international aid and cat. He is currently retired but previously worked for [employer]. 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 He is a current smoker and started at the age of 20. He has smoked on average 0.5 packs per day (current 25 pack-year history). He also reports inhaled cannabis use a few times a week. Currently smoking 1 ppd (up from 0.5 ppd). He is currently using Cannabis 2-3 times a week.     Family History: Adopted, no known history.    MEDICATIONS AND ALLERGIES:     CURRENT MEDICATIONS:  Outpatient Encounter Medications as of 12/13/2021   Medication Sig Dispense Refill   ??? albuterol HFA 90 mcg/actuation inhaler Inhale 1 puff every six (6) hours as needed for wheezing or shortness of breath. 8.5 g 0   ??? ARIPiprazole (ABILIFY) 5 MG tablet Take 1 tablet (5 mg  total) by mouth daily. 90 tablet 0   ??? atorvastatin (LIPITOR) 10 MG tablet Take 1 tablet (10 mg total) by mouth daily. 90 tablet 1   ??? dolutegravir (TIVICAY) 50 mg TABLET Take 1 tablet (50 mg total) by mouth daily. 30 tablet 5   ??? emtricitabine-tenofovir alafen (DESCOVY) 200-25 mg tablet Take 1 tablet by mouth daily. 30 tablet 5   ??? fluticasone (FLONASE) 50 mcg/actuation nasal spray 1 spray by Each Nare route daily. 16 g 0   ??? ipratropium (ATROVENT) 42 mcg (0.06 %) nasal spray Use 2 sprays into each nostril four (4) times a day. 15 mL 2   ??? lamoTRIgine (LAMICTAL) 200 MG tablet Take 1 tablet (200 mg total) by mouth daily. 90 tablet 0   ??? losartan (COZAAR) 25 MG tablet Take 1 tablet (25 mg total) by mouth daily. 90 tablet 1   ??? naproxen sodium (ALEVE) 220 MG tablet Take 220 mg by mouth daily as needed for pain.     ??? tiotropium-olodateroL (STIOLTO RESPIMAT) 2.5-2.5 mcg/actuation Mist Inhale 2 puffs daily. 4 g 11   ??? traZODone (DESYREL) 100 MG tablet Take 2.5 tablets (250 mg total) by mouth nightly. 250 tablet 0     No facility-administered encounter medications on file as of 12/13/2021.       ALLERGIES:  Allergies as of 12/13/2021 - Reviewed 12/13/2021   Allergen Reaction Noted   ??? Pollen extracts  03/09/2017   ??? Sulfa (sulfonamide antibiotics) Rash 01/29/2013       PHYSICAL EXAM:     Vitals:    12/13/21 0959   BP: 103/66   BP Site: L Arm   BP Position: Sitting   BP Cuff Size: Large   Pulse: 90   Temp: 37.3 ??C (99.1 ??F)   TempSrc: Oral   SpO2: 96%   Weight: 94.7 kg (208 lb 12.8 oz)   Height: 195.6 cm (6' 5)    Body mass index is 24.76 kg/m??.    GENERAL: well nourished, cooperative, no acute distress  EYES: anicteric, noninjected, EOMi  HEENT: Neck supple, trachea midline, moist mucus membranes, oropharynx without lesions or thrush  LYMPH: no cervical, submandibular, or supraclavicular lymphadenopathy  CV: Regular rate, normal rhythm, no murmurs/rubs/gallops  PULM: Clear to auscultation bilaterally. No dullness to percussion. Normal excursion. Normal work of breathing.   ABD: Soft, nontender, nondistended. Normoactive bowel sounds.   EXTREMITIES: No digital clubbing. No edema.  SKIN: No rashes, lesions, or skin breakdown. Warm and well perfused.  NEURO: No focal neurologic deficits. Moves all extremities and follows commands.   PSYCH: Well groomed, appropriate mood and affect, good eye contact.       ANCILLARY DATA:     All imaging and labs were reviewed personally.    Date: FVC (% Pred) FEV1 (% Pred)  Pre-BD FEV1 (%Pred)  Post- BD FEV1/FVC DLCO (% Pred) VC (% Pred) TLC (% Pred) RV (%Pred)   August 2012 6.34 (107%) 3.10 (69%) 3.29 (73%) 49 (76) 54%      January 2022 6.49 (117%) 2.91 (70%) 2.92 (71%) 45 (63) 79%        CT LCS (11/2019): Non-calcified RML 0.4 cm, Lung-RADS 2.  CT LCS (12/2020): 0.4 cm RML nodule unchanged. New 0.3 LUL nodule. Lung-RADS 2.    CT sinus (11/2019): Moderate degree of mucosal thickening in the ethmoidal sinuses with mild mucosal thickening seen in the rest of the sinuses. Small right maxillary sinus noted.   IgE (06/2020): 117  ASSESSMENT AND PLAN:     Mr. Bunyard is a 66 year old gentleman with a PMH as below including HIV on ART, tobacco use and group IIA COPD.     His mMRC score indicates well-controlled disease on Stiolto. His PRN albuterol has actually decreased since his last visit, which could be attributed to treatment of his PJP versus better control of his sinus disease. We will continue his current LAMA/LABA regimen. He is due to follow up with ENT later this week.    He does report URI symptoms today, so we will send a respiratory pathogen panel.     He is up to date on his TDaP, pneumonia, influenza, and COVID vaccines including booster.     He is engaged in lung cancer screening and his next CT is due now and has been scheduled for April 4th.     He has actually increased his tobacco use since his last visit from 0.5 ppd to 1 ppd. We again discussed the importance of tobacco cessation particularly on preventing continued lung function decline but he is not interested today. I provided information on the Tobacco Treatment Program in the HIV clinic for him to consider in the future.     I will have him follow up in 6 months.    I personally spent 24 minutes face-to-face and non-face-to-face in the care of this patient, which includes all pre, intra, and post visit time on the date of service.

## 2021-12-13 NOTE — Unmapped (Signed)
PROMIS Tablet Screening  Completed Date: 12/13/2021     SW reviewed self-administered screening.    Patient had a PHQ-9 score of 5  indicating Mild depression.   Pt denies SI.   Pt scored 0 on AUDIT/AUDIT-C indicating Not-at-risk alcohol consumption  Pt  endorses Marijuana substance use in past 3 months.  Pt denies concerns for IPV.    Pt seen by provider for regular ID visit.  Pt was not seen in person by Social Work during this visit.     Sofie Rower, LCSW-A  [facility] Social Work

## 2021-12-14 DIAGNOSIS — I77810 Thoracic aortic ectasia: Secondary | ICD-10-CM | POA: Diagnosis not present

## 2021-12-14 DIAGNOSIS — Z1389 Encounter for screening for other disorder: Secondary | ICD-10-CM | POA: Diagnosis not present

## 2021-12-14 DIAGNOSIS — F319 Bipolar disorder, unspecified: Secondary | ICD-10-CM | POA: Diagnosis not present

## 2021-12-14 DIAGNOSIS — Z21 Asymptomatic human immunodeficiency virus [HIV] infection status: Secondary | ICD-10-CM | POA: Diagnosis not present

## 2021-12-16 DIAGNOSIS — J449 Chronic obstructive pulmonary disease, unspecified: Principal | ICD-10-CM

## 2021-12-16 MED ORDER — PEG 3350-ELECTROLYTES 236 GRAM-22.74 GRAM-6.74 GRAM-5.86 GRAM SOLUTION
Freq: Once | ORAL | 0 refills | 1 days | Status: CP
Start: 2021-12-16 — End: 2021-12-17
  Filled 2021-12-16: qty 4000, 1d supply, fill #0

## 2021-12-16 MED FILL — DESCOVY 200 MG-25 MG TABLET: ORAL | 30 days supply | Qty: 30 | Fill #1

## 2021-12-16 MED FILL — TIVICAY 50 MG TABLET: ORAL | 30 days supply | Qty: 30 | Fill #1

## 2021-12-22 ENCOUNTER — Ambulatory Visit: Admit: 2021-12-22 | Discharge: 2021-12-22 | Payer: MEDICARE

## 2021-12-22 ENCOUNTER — Encounter: Admit: 2021-12-22 | Discharge: 2021-12-22 | Payer: MEDICARE | Attending: Anesthesiology | Primary: Anesthesiology

## 2021-12-22 DIAGNOSIS — E7800 Pure hypercholesterolemia, unspecified: Secondary | ICD-10-CM | POA: Diagnosis not present

## 2021-12-22 DIAGNOSIS — Z882 Allergy status to sulfonamides status: Secondary | ICD-10-CM | POA: Diagnosis not present

## 2021-12-22 DIAGNOSIS — Z86010 Personal history of colonic polyps: Secondary | ICD-10-CM | POA: Diagnosis not present

## 2021-12-22 DIAGNOSIS — D122 Benign neoplasm of ascending colon: Secondary | ICD-10-CM | POA: Diagnosis not present

## 2021-12-22 DIAGNOSIS — Z79899 Other long term (current) drug therapy: Secondary | ICD-10-CM | POA: Diagnosis not present

## 2021-12-22 DIAGNOSIS — D120 Benign neoplasm of cecum: Secondary | ICD-10-CM | POA: Diagnosis not present

## 2021-12-22 DIAGNOSIS — D123 Benign neoplasm of transverse colon: Secondary | ICD-10-CM | POA: Diagnosis not present

## 2021-12-22 DIAGNOSIS — D124 Benign neoplasm of descending colon: Secondary | ICD-10-CM | POA: Diagnosis not present

## 2021-12-22 DIAGNOSIS — K635 Polyp of colon: Secondary | ICD-10-CM | POA: Diagnosis not present

## 2021-12-22 DIAGNOSIS — N189 Chronic kidney disease, unspecified: Secondary | ICD-10-CM | POA: Diagnosis not present

## 2021-12-22 DIAGNOSIS — J439 Emphysema, unspecified: Secondary | ICD-10-CM | POA: Diagnosis not present

## 2021-12-22 DIAGNOSIS — Z1211 Encounter for screening for malignant neoplasm of colon: Secondary | ICD-10-CM | POA: Diagnosis not present

## 2021-12-22 MED ADMIN — lidocaine (XYLOCAINE) 20 mg/mL (2 %) injection: INTRAVENOUS | @ 19:00:00 | Stop: 2021-12-22

## 2021-12-22 MED ADMIN — propofoL (DIPRIVAN) injection: INTRAVENOUS | @ 19:00:00 | Stop: 2021-12-22

## 2021-12-22 MED ADMIN — sodium chloride (NS) 0.9 % infusion: INTRAVENOUS | @ 19:00:00 | Stop: 2021-12-22

## 2021-12-22 MED ADMIN — sodium chloride (NS) 0.9 % infusion: 10 mL/h | INTRAVENOUS | @ 19:00:00 | Stop: 2021-12-22

## 2021-12-22 MED ADMIN — propofoL (DIPRIVAN) injection: INTRAVENOUS | @ 20:00:00 | Stop: 2021-12-22

## 2022-01-04 DIAGNOSIS — F32A Depression, unspecified depression type: Principal | ICD-10-CM

## 2022-01-04 MED ORDER — LAMOTRIGINE 200 MG TABLET
ORAL_TABLET | Freq: Every day | ORAL | 0 refills | 90 days
Start: 2022-01-04 — End: ?

## 2022-01-05 MED ORDER — LAMOTRIGINE 200 MG TABLET
ORAL_TABLET | Freq: Every day | ORAL | 1 refills | 30 days | Status: CP
Start: 2022-01-05 — End: ?
  Filled 2022-01-06: qty 30, 30d supply, fill #0

## 2022-01-05 NOTE — Unmapped (Signed)
Medication Requested: Lamotrigine      Last Office Visit: 12/13/2021  Next Office Visit: 03/15/2022  Per Provider Note: The patient exhibits mild depressive sx per PHQ9, and would benefit from augmentation of his current medication regimen of Lamictal 200mg . As the patient most frequently experiences episodes of bipolar depression, will plan to start Abilify as an augmentation strategy and titrate to effect.      Standing order protocol requirements met?: Yes    Sent to: Pharmacy per protocol    Days Supply Given: 30 days  Number of Refills: 1

## 2022-01-07 MED FILL — STIOLTO RESPIMAT 2.5 MCG-2.5 MCG/ACTUATION SOLUTION FOR INHALATION: RESPIRATORY_TRACT | 30 days supply | Qty: 4 | Fill #9

## 2022-01-10 NOTE — Unmapped (Signed)
[facility] Refill Coordination Note    Specialty Medication(s) to be Shipped:   Infectious Disease: Descovy and Tivicay    Other medication(s) to be shipped: No additional medications requested for fill at this time     Andrew Oconnell, DOB: 1956-07-22  Phone: [phone] (home)       All above HIPAA information was verified with patient.     Was a Nurse, learning disability used for this call? No    Completed refill call assessment today to schedule patient's medication shipment from the [facility] [phone]).  All relevant notes have been reviewed.     Specialty medication(s) and dose(s) confirmed: Regimen is correct and unchanged.   Changes to medications: [name] reports no changes at this time.  Changes to insurance: No  New side effects reported not previously addressed with a pharmacist or physician: None reported  Questions for the pharmacist: No    Confirmed patient received a Conservation officer, historic buildings and a Surveyor, mining with first shipment. The patient will receive a drug information handout for each medication shipped and additional FDA Medication Guides as required.       DISEASE/MEDICATION-SPECIFIC INFORMATION        N/A    SPECIALTY MEDICATION ADHERENCE     Medication Adherence    Patient reported X missed doses in the last month: 0  Specialty Medication: tivicay  Patient is on additional specialty medications: Yes  Additional Specialty Medications: descovy  Patient Reported Additional Medication X Missed Doses in the Last Month: 0  Informant: patient              Were doses missed due to medication being on hold? No    Descovy 200-25 mg: 15 days of medicine on hand   Tivicay 50 mg: 15 days of medicine on hand       REFERRAL TO PHARMACIST     Referral to the pharmacist: Not needed      [facility]     Shipping address confirmed in Epic.     Delivery Scheduled: Yes, Expected medication delivery date: 01/20/22.     Medication will be delivered via UPS to the prescription address in Epic WAM.    [name]   [facility] Specialty Technician

## 2022-01-19 ENCOUNTER — Ambulatory Visit: Admit: 2022-01-19 | Discharge: 2022-01-19 | Payer: MEDICARE

## 2022-01-19 DIAGNOSIS — I2510 Atherosclerotic heart disease of native coronary artery without angina pectoris: Secondary | ICD-10-CM | POA: Diagnosis not present

## 2022-01-19 DIAGNOSIS — R918 Other nonspecific abnormal finding of lung field: Secondary | ICD-10-CM | POA: Diagnosis not present

## 2022-01-19 DIAGNOSIS — F17210 Nicotine dependence, cigarettes, uncomplicated: Secondary | ICD-10-CM | POA: Diagnosis not present

## 2022-01-19 DIAGNOSIS — I2584 Coronary atherosclerosis due to calcified coronary lesion: Secondary | ICD-10-CM | POA: Diagnosis not present

## 2022-01-19 DIAGNOSIS — Z122 Encounter for screening for malignant neoplasm of respiratory organs: Secondary | ICD-10-CM | POA: Diagnosis not present

## 2022-01-19 DIAGNOSIS — Z87891 Personal history of nicotine dependence: Principal | ICD-10-CM

## 2022-01-19 DIAGNOSIS — Z720 Tobacco use: Principal | ICD-10-CM

## 2022-01-19 MED FILL — DESCOVY 200 MG-25 MG TABLET: ORAL | 30 days supply | Qty: 30 | Fill #2

## 2022-01-19 MED FILL — TIVICAY 50 MG TABLET: ORAL | 30 days supply | Qty: 30 | Fill #2

## 2022-02-01 MED FILL — LAMOTRIGINE 200 MG TABLET: ORAL | 30 days supply | Qty: 30 | Fill #1

## 2022-02-11 DIAGNOSIS — E7800 Pure hypercholesterolemia, unspecified: Principal | ICD-10-CM

## 2022-02-11 DIAGNOSIS — I10 Essential (primary) hypertension: Principal | ICD-10-CM

## 2022-02-11 MED ORDER — ATORVASTATIN 10 MG TABLET
ORAL_TABLET | Freq: Every day | ORAL | 1 refills | 90 days
Start: 2022-02-11 — End: ?

## 2022-02-11 MED ORDER — ARIPIPRAZOLE 5 MG TABLET
ORAL_TABLET | Freq: Every day | ORAL | 0 refills | 90 days | Status: CP
Start: 2022-02-11 — End: ?
  Filled 2022-02-17: qty 90, 90d supply, fill #0

## 2022-02-11 MED ORDER — TRAZODONE 100 MG TABLET
ORAL_TABLET | Freq: Every evening | ORAL | 0 refills | 100 days | Status: CP
Start: 2022-02-11 — End: ?
  Filled 2022-02-17: qty 225, 90d supply, fill #0

## 2022-02-11 MED ORDER — LOSARTAN 25 MG TABLET
ORAL_TABLET | Freq: Every day | ORAL | 1 refills | 90 days
Start: 2022-02-11 — End: ?

## 2022-02-11 NOTE — Unmapped (Signed)
Medication Requested: Atorvastatin 10 mg and Losartan 25 mg      Last Office Visit: 12/13/2021 (Dr. Sallyanne Kuster)  12/29/2022Lucita Lora    Next Office Visit: 02/11/2022    Per Provider Note:   -Continue Losartan for HTN, at goal today  Now that patient has established primary care in [place], will defer management to PCP.    Standing order protocol requirements met?: No- Should patient obtain from PCP?    Sent to: Provider for signing    Days Supply Given: 0    Number of Refills: 0

## 2022-02-11 NOTE — Unmapped (Signed)
[facility] Refill Coordination Note    Specialty Medication(s) to be Shipped:   Infectious Disease: Descovy and Tivicay    Other medication(s) to be shipped:  [employer], atorvastatin,trazodone,aripiprazole       Andrew Oconnell, DOB: 1955-12-25  Phone: [phone] (home)       All above HIPAA information was verified with patient.     Was a Nurse, learning disability used for this call? No    Completed refill call assessment today to schedule patient's medication shipment from the [facility] [phone]).  All relevant notes have been reviewed.     Specialty medication(s) and dose(s) confirmed: Regimen is correct and unchanged.   Changes to medications: Viral reports no changes at this time.  Changes to insurance: No  New side effects reported not previously addressed with a pharmacist or physician: None reported  Questions for the pharmacist: No    Confirmed patient received a Conservation officer, historic buildings and a Surveyor, mining with first shipment. The patient will receive a drug information handout for each medication shipped and additional FDA Medication Guides as required.       DISEASE/MEDICATION-SPECIFIC INFORMATION        N/A    SPECIALTY MEDICATION ADHERENCE     Medication Adherence    Patient reported X missed doses in the last month: 0  Specialty Medication: TIVICAY 50 mg  Patient is on additional specialty medications: Yes  Additional Specialty Medications: DESCOVY 200-25 mg    Patient Reported Additional Medication X Missed Doses in the Last Month: 0  Patient is on more than two specialty medications: No              Were doses missed due to medication being on hold? No    descovy 200-25 mg: 14 days of medicine on hand   tivicay 50 mg: 14 days of medicine on hand        REFERRAL TO PHARMACIST     Referral to the pharmacist: Not needed      [facility]     Shipping address confirmed in Epic.     Delivery Scheduled: Yes, Expected medication delivery date: 02/18/22.     Medication will be delivered via UPS to the prescription address in Epic WAM.    Unk Lightning   [facility] Specialty Technician

## 2022-02-11 NOTE — Unmapped (Signed)
Medication Requested: Trazodone      Last Office Visit: 12/13/2021  Next Office Visit: 03/15/2022  Per Provider Note:  traZODone (DESYREL) 100 MG tablet Take 2 tablets (200 mg total) by mouth nightly. 180 tablet 2     Standing order protocol requirements met?: No    Sent to: Provider for signing    Days Supply Given: 0  Number of Refills: 0

## 2022-02-15 MED ORDER — LOSARTAN 25 MG TABLET
ORAL_TABLET | Freq: Every day | ORAL | 0 refills | 90 days | Status: CP
Start: 2022-02-15 — End: ?
  Filled 2022-02-17: qty 90, 90d supply, fill #0

## 2022-02-15 MED ORDER — ATORVASTATIN 10 MG TABLET
ORAL_TABLET | Freq: Every day | ORAL | 0 refills | 90 days | Status: CP
Start: 2022-02-15 — End: ?
  Filled 2022-02-17: qty 90, 90d supply, fill #0

## 2022-02-17 DIAGNOSIS — Z23 Encounter for immunization: Secondary | ICD-10-CM | POA: Diagnosis not present

## 2022-02-17 DIAGNOSIS — Z21 Asymptomatic human immunodeficiency virus [HIV] infection status: Secondary | ICD-10-CM | POA: Diagnosis not present

## 2022-02-17 DIAGNOSIS — W450XXA Nail entering through skin, initial encounter: Secondary | ICD-10-CM | POA: Diagnosis not present

## 2022-02-17 DIAGNOSIS — J449 Chronic obstructive pulmonary disease, unspecified: Secondary | ICD-10-CM | POA: Diagnosis not present

## 2022-02-17 MED FILL — DESCOVY 200 MG-25 MG TABLET: ORAL | 30 days supply | Qty: 30 | Fill #3

## 2022-02-17 MED FILL — STIOLTO RESPIMAT 2.5 MCG-2.5 MCG/ACTUATION SOLUTION FOR INHALATION: RESPIRATORY_TRACT | 30 days supply | Qty: 4 | Fill #10

## 2022-02-17 MED FILL — TIVICAY 50 MG TABLET: ORAL | 30 days supply | Qty: 30 | Fill #3

## 2022-03-03 DIAGNOSIS — F32A Depression, unspecified depression type: Principal | ICD-10-CM

## 2022-03-03 MED ORDER — LAMOTRIGINE 200 MG TABLET
ORAL_TABLET | Freq: Every day | ORAL | 3 refills | 30 days | Status: CP
Start: 2022-03-03 — End: ?

## 2022-03-03 NOTE — Unmapped (Addendum)
Medication Requested: lamotrigine      Last Office Visit: 07/15/2021  Next Office Visit: 05/26/2022  Per Provider Note: current medication regimen of Lamictal 200mg .     Standing order protocol requirements met?: Yes    Sent to: Pharmacy per protocol    Days Supply Given: 30 days  Number of Refills: 3           ----- Message from Ruthe Mannan sent at 03/03/2022 11:43 AM EDT -----  Regarding: Patient Call Back  Medication Refill, Question, or Clarification  Caller: Letitia Caul  Best callback number: [phone]    Type of request (refill, question, etc): Refill  Name of medication: lamotrigine (LAMICTAL) 200 MG tablet   Desired pharmacy:      [facility]  [address]  [place] [address]  Phone: [phone] Fax: [phone]    Does patient request a callback from a nurse to discuss?: No

## 2022-03-11 MED ORDER — STIOLTO RESPIMAT 2.5 MCG-2.5 MCG/ACTUATION SOLUTION FOR INHALATION
Freq: Every day | RESPIRATORY_TRACT | 11 refills | 14 days
Start: 2022-03-11 — End: ?

## 2022-03-11 NOTE — Unmapped (Signed)
[facility] Pharm[facility] Refill Coordination Note    Specialty Medication(s) to be Shipped:   Infectious Disease: Descovy and Tivicay    Other medication(s) to be shipped: stiloto     Andrew Oconnell, DOB: 1955-11-25  Phone: [phone] (home)       All above HIPAA information was verified with patient.     Was a Nurse, learning disability used for this call? No    Completed refill call assessment today to schedule patient's medication shipment from the [facility] [phone]).  All relevant notes have been reviewed.     Specialty medication(s) and dose(s) confirmed: Regimen is correct and unchanged.   Changes to medications: Jadrien reports no changes at this time.  Changes to insurance: No  New side effects reported not previously addressed with a pharmacist or physician: None reported  Questions for the pharmacist: No    Confirmed patient received a Conservation officer, historic buildings and a Surveyor, mining with first shipment. The patient will receive a drug information handout for each medication shipped and additional FDA Medication Guides as required.       DISEASE/MEDICATION-SPECIFIC INFORMATION        N/A    SPECIALTY MEDICATION ADHERENCE     Medication Adherence    Patient reported X missed doses in the last month: 0  Specialty Medication: tivicay  Patient is on additional specialty medications: Yes  Additional Specialty Medications: Descovy   Patient Reported Additional Medication X Missed Doses in the Last Month: 0  Patient is on more than two specialty medications: No  Any gaps in refill history greater than 2 weeks in the last 3 months: no  Demonstrates understanding of importance of adherence: yes  Informant: patient  Reliability of informant: reliable  Provider-estimated medication adherence level: good  Patient is at risk for Non-Adherence: No  Reasons for non-adherence: no problems identified  Confirmed plan for next specialty medication refill: delivery by pharmacy  Refills needed for supportive medications: not needed          Refill Coordination    Has the Patients' Contact Information Changed: No  Is the Shipping Address Different: No         Were doses missed due to medication being on hold? No    descovy  200-25 mg: 9 days of medicine on hand   tivicay  50 mg: 9 days of medicine on hand         REFERRAL TO PHARMACIST     Referral to the pharmacist: Not needed      [facility]     Shipping address confirmed in Epic.     Delivery Scheduled: Yes, Expected medication delivery date: 06/01.     Medication will be delivered via UPS to the prescription address in Epic WAM.    Antonietta Barcelona   [facility] Specialty Technician

## 2022-03-12 MED ORDER — STIOLTO RESPIMAT 2.5 MCG-2.5 MCG/ACTUATION SOLUTION FOR INHALATION
Freq: Every day | RESPIRATORY_TRACT | 11 refills | 14 days | Status: CP
Start: 2022-03-12 — End: ?
  Filled 2022-03-16: qty 4, 30d supply, fill #0

## 2022-03-15 ENCOUNTER — Ambulatory Visit: Admit: 2022-03-15 | Discharge: 2022-03-16 | Payer: MEDICARE

## 2022-03-15 ENCOUNTER — Ambulatory Visit: Admit: 2022-03-15 | Discharge: 2022-03-16 | Payer: MEDICARE | Attending: Family | Primary: Family

## 2022-03-15 DIAGNOSIS — G479 Sleep disorder, unspecified: Principal | ICD-10-CM

## 2022-03-15 DIAGNOSIS — J42 Unspecified chronic bronchitis: Principal | ICD-10-CM

## 2022-03-15 DIAGNOSIS — J449 Chronic obstructive pulmonary disease, unspecified: Principal | ICD-10-CM

## 2022-03-15 DIAGNOSIS — Z882 Allergy status to sulfonamides status: Secondary | ICD-10-CM | POA: Diagnosis not present

## 2022-03-15 DIAGNOSIS — R161 Splenomegaly, not elsewhere classified: Secondary | ICD-10-CM | POA: Diagnosis not present

## 2022-03-15 DIAGNOSIS — Z20822 Contact with and (suspected) exposure to covid-19: Secondary | ICD-10-CM | POA: Diagnosis not present

## 2022-03-15 DIAGNOSIS — I2510 Atherosclerotic heart disease of native coronary artery without angina pectoris: Secondary | ICD-10-CM | POA: Diagnosis not present

## 2022-03-15 DIAGNOSIS — R051 Acute cough: Secondary | ICD-10-CM | POA: Diagnosis not present

## 2022-03-15 DIAGNOSIS — Z23 Encounter for immunization: Secondary | ICD-10-CM | POA: Diagnosis not present

## 2022-03-15 DIAGNOSIS — F17210 Nicotine dependence, cigarettes, uncomplicated: Secondary | ICD-10-CM | POA: Diagnosis not present

## 2022-03-15 DIAGNOSIS — E7800 Pure hypercholesterolemia, unspecified: Secondary | ICD-10-CM | POA: Diagnosis not present

## 2022-03-15 DIAGNOSIS — I129 Hypertensive chronic kidney disease with stage 1 through stage 4 chronic kidney disease, or unspecified chronic kidney disease: Secondary | ICD-10-CM | POA: Diagnosis not present

## 2022-03-15 DIAGNOSIS — N189 Chronic kidney disease, unspecified: Secondary | ICD-10-CM | POA: Diagnosis not present

## 2022-03-15 DIAGNOSIS — B20 Human immunodeficiency virus [HIV] disease: Secondary | ICD-10-CM | POA: Diagnosis not present

## 2022-03-15 DIAGNOSIS — I77819 Aortic ectasia, unspecified site: Secondary | ICD-10-CM | POA: Diagnosis not present

## 2022-03-15 LAB — LYMPH MARKER LIMITED,FLOW
ABSOLUTE CD3 CNT: 996 {cells}/uL (ref 915–3400)
ABSOLUTE CD4 CNT: 444 {cells}/uL — ABNORMAL LOW (ref 510–2320)
ABSOLUTE CD8 CNT: 552 {cells}/uL (ref 180–1520)
CD3% (T CELLS): 83 % (ref 61–86)
CD4% (T HELPER): 37 % (ref 34–58)
CD4:CD8 RATIO: 0.8 — ABNORMAL LOW (ref 0.9–4.8)
CD8% T SUPPRESR: 46 % — ABNORMAL HIGH (ref 12–38)

## 2022-03-15 LAB — CBC W/ AUTO DIFF
BASOPHILS ABSOLUTE COUNT: 0.1 10*9/L (ref 0.0–0.1)
BASOPHILS RELATIVE PERCENT: 1.1 %
EOSINOPHILS ABSOLUTE COUNT: 0.1 10*9/L (ref 0.0–0.5)
EOSINOPHILS RELATIVE PERCENT: 2.2 %
HEMATOCRIT: 37.5 % — ABNORMAL LOW (ref 39.0–48.0)
HEMOGLOBIN: 12.8 g/dL — ABNORMAL LOW (ref 12.9–16.5)
LYMPHOCYTES ABSOLUTE COUNT: 1.2 10*9/L (ref 1.1–3.6)
LYMPHOCYTES RELATIVE PERCENT: 25.4 %
MEAN CORPUSCULAR HEMOGLOBIN CONC: 34.2 g/dL (ref 32.0–36.0)
MEAN CORPUSCULAR HEMOGLOBIN: 34.3 pg — ABNORMAL HIGH (ref 25.9–32.4)
MEAN CORPUSCULAR VOLUME: 100.5 fL — ABNORMAL HIGH (ref 77.6–95.7)
MEAN PLATELET VOLUME: 8 fL (ref 6.8–10.7)
MONOCYTES ABSOLUTE COUNT: 0.4 10*9/L (ref 0.3–0.8)
MONOCYTES RELATIVE PERCENT: 8.5 %
NEUTROPHILS ABSOLUTE COUNT: 3.1 10*9/L (ref 1.8–7.8)
NEUTROPHILS RELATIVE PERCENT: 62.8 %
NUCLEATED RED BLOOD CELLS: 0 /100{WBCs} (ref <=4)
PLATELET COUNT: 131 10*9/L — ABNORMAL LOW (ref 150–450)
RED BLOOD CELL COUNT: 3.73 10*12/L — ABNORMAL LOW (ref 4.26–5.60)
RED CELL DISTRIBUTION WIDTH: 12.9 % (ref 12.2–15.2)
WBC ADJUSTED: 4.9 10*9/L (ref 3.6–11.2)

## 2022-03-15 LAB — BASIC METABOLIC PANEL
ANION GAP: 6 mmol/L (ref 5–14)
BLOOD UREA NITROGEN: 17 mg/dL (ref 9–23)
BUN / CREAT RATIO: 17
CALCIUM: 9.8 mg/dL (ref 8.7–10.4)
CHLORIDE: 106 mmol/L (ref 98–107)
CO2: 27.5 mmol/L (ref 20.0–31.0)
CREATININE: 1.01 mg/dL
EGFR CKD-EPI (2021) MALE: 81 mL/min/{1.73_m2} (ref >=60)
GLUCOSE RANDOM: 102 mg/dL (ref 70–179)
POTASSIUM: 4.1 mmol/L (ref 3.4–4.8)
SODIUM: 139 mmol/L (ref 135–145)

## 2022-03-15 LAB — BILIRUBIN, TOTAL: BILIRUBIN TOTAL: 0.4 mg/dL (ref 0.3–1.2)

## 2022-03-15 LAB — ALT: ALT (SGPT): 11 U/L (ref 10–49)

## 2022-03-15 LAB — HIV RNA, QUANTITATIVE, PCR
HIV RNA QNT RSLT: DETECTED — AB
HIV RNA: <20 {copies}/mL — ABNORMAL HIGH (ref <0)

## 2022-03-15 LAB — AST: AST (SGOT): 19 U/L (ref <=34)

## 2022-03-15 MED ORDER — TIVICAY 50 MG TABLET
ORAL_TABLET | Freq: Every day | ORAL | 11 refills | 30 days | Status: CP
Start: 2022-03-15 — End: ?
  Filled 2022-03-16: qty 30, 30d supply, fill #0

## 2022-03-15 MED ORDER — DESCOVY 200 MG-25 MG TABLET
ORAL_TABLET | Freq: Every day | ORAL | 11 refills | 30 days | Status: CP
Start: 2022-03-15 — End: ?
  Filled 2022-03-16: qty 30, 30d supply, fill #0

## 2022-03-15 NOTE — Unmapped (Signed)
[facility]  [address]  [place], [place]  [address]  P [phone]  F [phone]     Primary care provider: Jodi Marble, AGNP    Assessment/Plan:      HIV (dx'd 03/2002)  - chronic, stable  Diagnosed with PJP pneumonia at time of HIV diagnosis    Overall doing well. Current regimen: Descovy (FTC/TAF) and dolutegravir  Misses doses of ARVs never Denies any missed doses since last visit.    Med access through insurance  CD4 count  300's  Discussed ARV adherence, continuing prophylaxis for OI and taking ARVs with food    Lab Results   Component Value Date    ACD4 429 (L) 10/14/2021    CD4 39 10/14/2021    HIVRS Detected (A) 10/14/2021    HIVCP <40 (H) 10/14/2021     CD4, HIV RNA, and safety labs (full return panel)  Continue current therapy. On BIktarvy  Discussed importance of ARV adherence  Consolidating care through PCP - primary care/HIV care. Today will be last visit and will continue follow up with PCP - Jodi Marble, AGNP. I discussed this with PCP and patient and all are amenable to this consolidation of care.      Nasal congestion and cough x 2 weeks. R/o URI - acute, new  Patient reports that he has started to have ongoing nasal congestion and occasional cough with clear/whitish sputum. Denies fever, chills, face pain. Attributes symptoms to seasonal allergies.  Obtain RPP and chest x-ray to rule out pneumonia. - xray negative, RPP negative  In the mean time, continue allergy medications (Allegra and Flonase)      Sleep disturbance - chronic  For several months, has fallen asleep in front of the TV in his room but when he turns out the lights and TV he is wide awake and then finds it difficult to sleep. He mentions having racing thoughts. He admits he has had some increased stress in his life recently.  Discussed some sleep hygiene practices such as turning out electronics about an hour before he'd like to go to sleep, avoid naps during the day, stay on a sleep schedule.  He reports managing this fairly well but he is amenable to trying these measures.      Right shoulder pain - chronic, acutely exacerbated  Has chronic right shoulder pain over last 2 years. Pain has worsened the last two months or so. Manageable. Takes aspirin to manage pain with relief.  Had left rotator cuff surgery some years ago but post op recovery was difficult and he does not want to have right shoulder surgery at this time.  Patient addressed with PCP who recommends patient increase exercise. He does try to exercise by walking but gets short of breath or back pain starts. Followed by Dr. Sallyanne Kuster for COPD.      Recent presumptive treatment for PJP - acute, resolved  Sore throat, cough, chills, night sweats, decreased appetite, has really dry mouth x 4 weeks   Took full course of Atovaquone 750mg  BID x 21 days and now taking 1500mg  daily for OI prophylaxis.  Has better appetite, put on 3#. Has some wheezing at night but has been without Stiolto and albuterol.   Roommate diagnosed with CoVid at same time as his URI symptoms(03/01/21), patient tested negative for CoVid multiple times  Taking Descovy/Tivicay consistently  No longer taking Atovaquone.  Will obtain another CD4 count today       Chronic hepatitis C (  RAF-HCC)/ HCC screening - chronic, stable  Genotype 1a, Grade 1, stage II disease by liver biopsy 01/31/2003  Previously treated in 07/28/2003 to 01/20/2004 at [facility] requiring early discontinuation secondary to significant side effects including what was thought to be interstitial pneumonitis. While on therapy required addition of both Neupogen and Procrit secondary to medication-induced cytopenias.  Treated again in 02/28/15 with Sofosbuvir and daclastasvir x 12 weeks   Followed by Hepatology, Dr. Arnette Felts FNP, last seen 11/25/19  12/02/2019 MRI of abdomen: Cirrhotic hepatic morphology with mild splenomegaly (14.3cm). No ascites. No suspicious hepatic lesions.  07/12/21 Hep C RNA today  Continue follow up with Hepatology, now overdue.   Has an appointment 03/17/22      Aortic root dilation (RAF-HCC) - chronic  Previously followed by Dr. Andrey Farmer with [facility], last seen 09/17/2015  Negative genetic testing for Marfan's Syndrome  09/30/15 Echocardiogram performed EF>55%, mild mitral prolapse and root dilation  Continue Losartan for HTN, at goal today  Now that patient has established primary care in [place], will defer management to PCP.       COPD (chronic obstructive pulmonary disease) - chronic  Manages by Dr. Laverle Patter, last seen 12/13/21  On Stiolto and albuterol  12/15/20 CT Lung Cancer screening shows:   - Overall Lung-RADS category 2, S  1. Lung-RADS category 2 (nodules with benign appearance and/or behavior)  -Based on: Similar 4 mm nodule within the right middle lobe   -Recommendations: Continued annual lung cancer screening which utilizes low dose CT imaging of the chest  -Follow-up date: January 21, 2023 or soon thereafter  2. Lung-RADS category S (incidental findings)  -Advanced destructive changes of centrilobular emphysema  -Moderate burden of calcified atherosclerotic disease within the coronary arterial distribution  Still smoking <1PPD, not open to smoking cessation.      Mental Health  Followed by [facility]  Recently feeling lonely, he's not sure this has to do with the winter as well.  His sister passed away in 2016-04-19, then her husband passed April 19, 2018, and recently he has some close people to him pass away as well. And I'm still here.  He believes this contributed to him not trying to restart ART.  Mood improved, feeling good overall.   Currently on Lamictal 200mg  daily and Abilify 5mg  daily  Has appointment with Dr.Goodman 05/26/22      History of chronic renal insufficiency  Stable Scr 1.0-1.16      Nicotine dependence  - chronic, stable  Revisit readiness at next appt. Declines today      Sexual health & secondary prevention  - chronic, stable  Not in relationship. Has not had sex since last visit.   Parts of body used during sex include: anus/rectum, mouth and penis. Versatile for anal sex. Gives and receives oral sex. Does not have vaginal sex.   Since last visit has had UNKNOWN sex and has not had add'l STI screening.  He does routinely discuss HIV status with partner(s).  Have not discussed interest in having children.    Lab Results   Component Value Date    RPR Nonreactive 07/12/2021    RPR Nonreactive 01/20/2021    CTNAA Negative 03/02/2021    CTNAA Negative 01/20/2021    CTNAA Negative 01/20/2021    GCNAA Negative 03/02/2021    GCNAA Negative 01/20/2021    GCNAA Negative 01/20/2021    SPECSOURCE Urine 03/02/2021    SPECSOURCE Throat 01/20/2021    SPECSOURCE Rectum 01/20/2021     GC/CT  NAATs - not being checked routinely for this patient  RPR - repeat 12 months after prior, due 06/2022      Health maintenance  - chronic, stable  PCP: Jodi Marble, AGNP at [facility].    Oral health  He does not have a dentist. Last dental exam unknown.    Eye health  He does  use corrective lenses. Last eye exam unknown.    Metabolic conditions  Wt Readings from Last 5 Encounters:   03/15/22 94.8 kg (209 lb)   12/22/21 95.3 kg (210 lb)   12/13/21 94.7 kg (208 lb 12.8 oz)   10/14/21 95.9 kg (211 lb 6.4 oz)   09/03/21 95.4 kg (210 lb 4.8 oz)     Lab Results   Component Value Date    CREATININE 1.01 03/15/2022    PROTEINUR 58.0 03/25/2020    PCRATIOUR 306 03/25/2020    GLU 102 03/15/2022    A1C 4.8 07/12/2021    ALT 11 03/15/2022    ALT 15 10/14/2021    ALT 15 07/12/2021    VITDTOTAL 26.3 11/25/2019     # Kidney health - creatinine today  # Bone health -  Last DEXA 07/2018  and FRAX score Major osteoporotic 6.1%/Hip Fracture 1.3% on this date: 05/04/2021  T score -1.0, repeat in 5 years 04/2026  # Diabetes assessment - random glucose today  # NAFLD assessment -  Needs to follow back up with Hepatology    Communicable diseases  Lab Results   Component Value Date QFTTBGOLD Negative 07/12/2021    HEPAIGG Reactive (A) 05/21/2015    HEPBSAB Reactive 01/10/2015    HEPCAB Positive (A) 01/10/2015    HCVRNA Not Detected 07/12/2021    HCVRNAIU 16 03/19/2015    HCVIU [id number] 07/29/2014     # TB screening - assessment needed but deferred to future visit  # Hepatitis screening -  Treated  # MMR screening - not assessed    Cancer screening  Lab Results   Component Value Date    PSA 0.22 07/12/2021    FINALDX  12/22/2021     Cecum with transverse and descending colon, biopsy:  -Tubular adenoma, multiple fragments.    This electronic signature is attestation that the pathologist personally reviewed the submitted material(s) and the final diagnosis reflects that evaluation.         # Anorectal - not yet done  # Colorectal -  12/22/21  # Liver -  Defer to Hepatology  # Lung - repeat low-dose CT 36m from prior, managed by Pulmonology  # Prostate -  Repeat in 2 years, due 06/2023    Cardiovascular disease  Lab Results   Component Value Date    CHOL 123 03/25/2020    HDL 66 03/25/2020    LDL 41 03/25/2020    NONHDL 103 10/04/2011    TRIG 79 03/25/2020     # The 10-year ASCVD risk score (Arnett DK, et al., 2019) is: 12.8%  - is not taking aspirin   - is taking statin  - BP control fair  - current smoker  # AAA screening - ultrasound ordered    Immunization History   Administered Date(s) Administered    COVID-19 VAC,BIVALENT(30YR UP),PFIZER 07/12/2021    COVID-19 VAC,BIVALENT,MODERNA(BLUE CAP) 03/15/2022    COVID-19 VACC,MRNA(BOOSTER)OR(6-30YR)MODERNA 03/18/2021    COVID-19 VACCINE,MRNA(MODERNA)(PF) 12/12/2019, 01/07/2020, 09/03/2020    HEPATITIS B VACCINE ADULT,IM(ENERGIX B, RECOMBIVAX) 04/17/2012, 07/17/2012, 01/29/2013    INFLUENZA TIV (TRI) PF (IM) 08/21/2007, 07/29/2008,  09/29/2009, 07/06/2010, 10/04/2011, 07/17/2012, 08/20/2013    Influenza Vaccine Quad (IIV4 PF) 14mo+ injectable 07/29/2014, 08/20/2015, 06/28/2016, 08/02/2017, 07/31/2018, 10/22/2019, 07/06/2020, 07/12/2021    Influenza Virus Vaccine, unspecified formulation 07/29/2014, 08/20/2015, 06/28/2016, 08/02/2017    Novel Influenza-h1n1-09, All Formulations 11/04/2008    PNEUMOCOCCAL POLYSACCHARIDE 23 11/25/2003, 02/17/2009, 01/12/2016    Pneumococcal Conjugate 13-Valent 10/23/2012    Pneumococcal Conjugate, Unspecified Formulation 10/23/2012    TdaP 02/22/2006, 06/19/2013, 02/17/2022     Immunizations today -  Shingrix needed , prefers to obtain with PCP.  CoVid BV booster given today.    I personally spent 45 minutes face-to-face and non-face-to-face in the care of this patient, which includes all pre, intra, and post visit time on the date of service.  All documented time was specific to the E/M visit and does not include any procedures that may have been performed.      Disposition  Next appointment: transferring care to PCP      To do @ next RTC  Seeing GI/Hepatology  Shingrix with PCP    Varney Daily, FNP-BC  [facility] at [facility]  [address] [place], [place] [address]    Phone: [phone]   Fax: [phone]             Subjective      Chief Complaint   HIV follow up    HPI  In addition to details in A&P above:  No missed ART doses.  Denies any fever, chills, nausea, vomiting, rash, urinary complaints, diarrhea, constipation.  Has a cat that gave birth on their back porch, fostering till they can meet with animal services next week.      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    Reflux     Vomiting     after eating         Social History  Background - Lives with roommate, has been socializing a little bit more    Housing - in house with roommate  School / Work & Benefits - on [employer]    Social History     Tobacco Use    Smoking status: Every Day     Packs/day: 0.50     Years: 43.00     Pack years: 21.50     Types: Cigarettes     Start date: 01/29/1970    Smokeless tobacco: Never    Tobacco comments:     10 cigarettes/day   Vaping Use    Vaping Use: Never used   Substance Use Topics    Alcohol use: Yes     Comment: has not had anything to drink in 2 months    Drug use: Yes     Frequency: 2.0 times per week     Types: Marijuana     Comment: weekly use ~ 2 times week. last used 2 days ago       Review of Systems  As per HPI. All others negative.      Medications and Allergies  He has a current medication list which includes the following prescription(s): albuterol, aripiprazole, atorvastatin, ipratropium, lamotrigine, losartan, naproxen sodium, stiolto respimat, trazodone, tivicay, descovy, and  fluticasone propionate.    Allergies: Pollen extracts and Sulfa (sulfonamide antibiotics)      Family History  His family history includes Hepatitis in his sister; Parkinsonism in his sister; Rheumatologic disease in his sister. He was adopted.          Objective:      BP 114/60 (BP Site: L Arm, BP Position: Sitting, BP Cuff Size: Large)  - Pulse 82  - Temp 36.9 ??C (98.5 ??F) (Temporal)  - Ht 195.6 cm (6' 5)  - Wt 94.8 kg (209 lb)  - BMI 24.78 kg/m??   Wt Readings from Last 3 Encounters:   03/15/22 94.8 kg (209 lb)   12/22/21 95.3 kg (210 lb)   12/13/21 94.7 kg (208 lb 12.8 oz)       Const looks well and attentive, alert, appropriate   Eyes sclerae anicteric, noninjected OU   ENT no thrush, leukoplakia or oral lesions   Lymph no cervical or supraclavicular LAD   CV RRR. No murmurs. No rub or gallop. S1/S2. Very distant heart tones,    Lungs CTAB ant/post, normal work of breathing, barrel chest   GI Soft, no organomegaly. NTND. NABS.   GU deferred   Rectal deferred   Skin no petechiae, ecchymoses or obvious rashes on clothed exam   MSK no joint tenderness and normal ROM throughout   Neuro grossly intact   Psych Appropriate affect. Eye contact good. Linear thoughts. Fluent speech.     Laboratory Data  Reviewed in Epic today, using Synopsis and Chart Review filters.    Lab Results   Component Value Date    CREATININE 1.01 03/15/2022    QFTTBGOLD Negative 07/12/2021    HEPCAB Positive (A) 01/10/2015    HCVRNA Not Detected 07/12/2021    HCVRNAIU 16 03/19/2015    HCVIU [id number] 07/29/2014    CHOL 123 03/25/2020    HDL 66 03/25/2020    LDL 41 03/25/2020    NONHDL 103 10/04/2011    TRIG 79 03/25/2020    PSA 0.22 07/12/2021    A1C 4.8 07/12/2021    FINALDX  12/22/2021     Cecum with transverse and descending colon, biopsy:  -Tubular adenoma, multiple fragments.    This electronic signature is attestation that the pathologist personally reviewed the submitted material(s) and the final diagnosis reflects that evaluation.

## 2022-03-17 ENCOUNTER — Ambulatory Visit: Admit: 2022-03-17 | Discharge: 2022-03-18 | Payer: MEDICARE

## 2022-03-17 DIAGNOSIS — F558 Abuse of other non-psychoactive substances: Principal | ICD-10-CM

## 2022-03-17 DIAGNOSIS — K7469 Other cirrhosis of liver: Secondary | ICD-10-CM | POA: Diagnosis not present

## 2022-03-17 DIAGNOSIS — Z21 Asymptomatic human immunodeficiency virus [HIV] infection status: Secondary | ICD-10-CM | POA: Diagnosis not present

## 2022-03-17 DIAGNOSIS — Z6824 Body mass index (BMI) 24.0-24.9, adult: Secondary | ICD-10-CM | POA: Diagnosis not present

## 2022-03-17 DIAGNOSIS — I77810 Thoracic aortic ectasia: Secondary | ICD-10-CM | POA: Diagnosis not present

## 2022-03-17 DIAGNOSIS — B182 Chronic viral hepatitis C: Secondary | ICD-10-CM | POA: Diagnosis not present

## 2022-03-17 DIAGNOSIS — K7460 Unspecified cirrhosis of liver: Secondary | ICD-10-CM | POA: Diagnosis not present

## 2022-03-17 LAB — HEPATIC FUNCTION PANEL
ALBUMIN: 4 g/dL (ref 3.4–5.0)
ALKALINE PHOSPHATASE: 74 U/L (ref 46–116)
ALT (SGPT): 11 U/L (ref 10–49)
AST (SGOT): 21 U/L (ref <=34)
BILIRUBIN DIRECT: 0.1 mg/dL (ref 0.00–0.30)
BILIRUBIN TOTAL: 0.3 mg/dL (ref 0.3–1.2)
PROTEIN TOTAL: 7.3 g/dL (ref 5.7–8.2)

## 2022-03-17 LAB — PROTIME-INR
INR: 1.02
PROTIME: 11.5 s (ref 9.8–12.8)

## 2022-03-17 LAB — AFP TUMOR MARKER: AFP-TUMOR MARKER: 2 ng/mL (ref <=8)

## 2022-03-17 NOTE — Unmapped (Signed)
Great to see you.     Today at your appointment:     - blood work   - The following imaging studies were ordered ultrasound : If this test was ordered at [facility], please call Radiology at [phone] to schedule your study. They will not call you.   - Follow- up in 6 months    It was a pleasure caring for you today. Please contact me or my nurse, Silvestre Moment, with any questions about your care.     Reeves Forth. Brieanna Nau, ANP- [facility]  [address]  [place] [address]  Phone [phone]       Important Contact Numbers:    Nurse:  Silvestre Moment, RN  Phone: [phone]  Main line [facility]: [phone]        Radiology: [phone]- Ultrasound/ MRI  If you are being scheduled for any type of radiology, you will need to call to receive your appointment time.?Please call this number for information.?    Important Contact Numbers:     GI Clinic Appointments:? [phone]  Please call the GI Clinic appointment line if you need to schedule, reschedule or cancel an appointment in clinic.? They can also answer any questions you may have about where your appointment is located and when you need to arrive.     GI Procedure Appointments:?[phone]  Please call the GI Procedures line if you need to schedule, reschedule or cancel ANY type of procedure (EGD, colonoscopy, motility testing, etc).? You can also call this number for prep instructions, etc.?     Radiology: [phone], option 3 or 4  If you are being scheduled for any type of radiology, you will need to call to receive your appointment time.? Please call this number for information.?     For emergencies after normal business hours or on weekends/holidays  Proceed to the nearest emergency room OR contact the [facility] Operator at [phone] who can page the Gastroenterology Fellow on call.     Financial Counselors:   Tracey Harries [phone]   Dorisann Frames [phone]      Pharmacy Assistance: [phone]

## 2022-03-17 NOTE — Unmapped (Signed)
[facility] LIVER CENTER    REFERRING PHYSICIAN: Dr. Miki Kins [facility] INFECTIOUS DISEASE    REASON FOR CONSULT:  Continued cirrhosis care  Secondary to chronic hepatitis C ~ Cured. +coinfected with HIV    Subjective:      Andrew Oconnell is a 66 y.o. 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 He was successfully treated with Sofosbuvir and Daklinza 60mg  x 24 weeks (start date 02/18/2015). His SVR12 HCV RNA level was not detected indicating evidence of having achieved SVR. MRI 05/19/14 revealed evidence of liver with nodular contour consistent with cirrhosis and splenomegaly. Most recent MRI of abdomen 02/2019 with similar results. EGD 09/2018 finding of Grade I esophageal varices. Patient does not have a family history of liver disease of which is aware, but he is adopted. He remains under the care of ID clinic at [facility]. HIV management ~ Tivicay 50 mg tablet once daily and Descovy 200-25 mg tablet daily. Some weekly alcohol use (liquor, 1-2 drinks weekends,  not daily).  Has been counseled extensively to avoid all alcohol. PMH Emphysema- smoking hx.     Interval events since last clinic visit with [facility] (11/25/2019). Larey Seat out of care due to pandemic and was actually feeling so well from a liver perspective that he felt he did not need to follow- up. His PCP encouraged him to come back. No events. No liver related complaints. Has not been getting HCC screening. Has cut back on alcohol to ~1 standard drink every 3 months. He is retired and busies himself with domestic chores and taking care of his animals. Overall doing really well. Still smoking.      Liver Care:  1. Chronic hepatitis C  HCV RNA Timeline: 2016  Pretreatment HCV RNA [id number]  SVR 2016  2.  HCC screening: MRI: 12/02/2019. Left lobe hypertrophy, undulating contour, splenomegaly, no ascites. No lesions.   3.  Variceal screening: 2021: No findings of varices.no PHG due 2024  For ongoing thrombocytopenia  4.  OLT: Not indicated at this time. + continued alcohol use. Will warrant six months of alcohol counseling.   5.   Positive immunity hepatitis A and B.  6. Bone Density (appreciate assistance from PCP) Last done 2019 (normal with mildly low bone mass in femur)   7. MELD 6        Allergies   Allergen Reactions   ??? Pollen Extracts      Congestion     ??? Sulfa (Sulfonamide Antibiotics) Rash     Current Outpatient Medications   Medication Sig Dispense Refill   ??? albuterol HFA 90 mcg/actuation inhaler Inhale 1 puff every six (6) hours as needed for wheezing or shortness of breath. 8.5 g 0   ??? ARIPiprazole (ABILIFY) 5 MG tablet Take 1 tablet (5 mg total) by mouth daily. 90 tablet 0   ??? atorvastatin (LIPITOR) 10 MG tablet Take 1 tablet (10 mg total) by mouth daily. 90 tablet 0   ??? dolutegravir (TIVICAY) 50 mg TABLET Take 1 tablet (50 mg total) by mouth daily. 30 tablet 11   ??? emtricitabine-tenofovir alafen (DESCOVY) 200-25 mg tablet Take 1 tablet by mouth daily. 30 tablet 11   ??? fluticasone (FLONASE) 50 mcg/actuation nasal spray 1 spray by Each Nare route daily. 16 g 0   ??? ipratropium (ATROVENT) 42 mcg (0.06 %) nasal spray Use 2 sprays into each nostril four (4) times a day. 15 mL 2   ??? lamoTRIgine (LAMICTAL) 200  MG tablet Take 1 tablet (200 mg total) by mouth daily. 30 tablet 3   ??? losartan (COZAAR) 25 MG tablet Take 1 tablet (25 mg total) by mouth daily. 90 tablet 0   ??? naproxen sodium (ALEVE) 220 MG tablet Take 1 tablet (220 mg total) by mouth daily as needed for pain.     ??? tiotropium-olodateroL (STIOLTO RESPIMAT) 2.5-2.5 mcg/actuation Mist Inhale 2 puffs daily. 4 g 11   ??? traZODone (DESYREL) 100 MG tablet Take 2.5 tablets (250 mg total) by mouth nightly. 250 tablet 0     No current facility-administered medications for this visit.      Active Ambulatory Problems     Diagnosis Date Noted   ??? Bipolar affective disorder (CMS-HCC) 02/17/2011   ??? Chronic hepatitis C (CMS-HCC) 06/22/2003   ??? 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 Human immunodeficiency virus (HIV) disease (CMS-HCC) 01/29/2013   ??? COPD (chronic obstructive pulmonary disease) (CMS-HCC) 01/29/2013   ??? Hypercholesteremia 01/29/2013   ??? Chronic renal insufficiency 06/12/2013   ??? Cancer screening 06/19/2013   ??? Tobacco abuse 06/19/2013   ??? Dry skin 08/20/2013   ??? Dysphagia 05/19/2014   ??? Aortic root dilation (CMS-HCC) 11/25/2014   ??? Left shoulder pain 11/25/2014   ??? Insomnia 02/23/2016   ??? 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 Benign prostatic hyperplasia with lower urinary tract symptoms 05/02/2017   ??? Depression 02/02/2021     Resolved Ambulatory Problems     Diagnosis Date Noted   ??? Nausea & vomiting 05/19/2014   ??? Syncope 06/17/2014   ??? Shortness of breath 09/17/2015   ??? COPD exacerbation (CMS-HCC) 01/11/2016   ??? COPD, moderate (CMS-HCC) 07/01/2014   ??? HIV positive, asymptomatic (CMS-HCC) 07/01/2014   ??? Acute bronchitis 10/23/2018   ??? 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 Emphysema of lung (CMS-HCC)    ??? Hepatitis C    ??? Human immunodeficiency virus (HIV) disease (CMS-HCC) 01/29/2013   ??? Reflux    ??? Vomiting      Past Surgical History:   Procedure Laterality Date   ??? BACK SURGERY N/A 1990   ??? LIGATION / DIVISION SAPHENOUS VEIN Bilateral     90s   ??? PR COLONOSCOPY W/BIOPSY SINGLE/MULTIPLE  12/22/2021    Procedure: COLONOSCOPY, FLEXIBLE, PROXIMAL TO SPLENIC FLEXURE; WITH BIOPSY, SINGLE OR MULTIPLE;  Surgeon: Luanne Bras, MD;  Location: [facility]EDURES [facility];  Service: Gastroenterology   ??? PR COLSC FLX W/RMVL OF TUMOR POLYP LESION SNARE TQ N/A 05/08/2015    Procedure: COLONOSCOPY FLEX; W/REMOV TUMOR/LES BY SNARE;  Surgeon: Teodoro Spray, MD;  [facility];  Service: Gastroenterology   ??? PR COLSC FLX W/RMVL OF TUMOR POLYP LESION SNARE TQ Left 12/22/2021    Procedure: COLONOSCOPY FLEX; W/REMOV TUMOR/LES BY SNARE;  Surgeon: Luanne Bras, MD;  Location: [facility];  Service: Gastroenterology   ??? PR ESOPHAGEAL MOTILITY STUDY, MANOMETRY N/A 06/04/2014    Procedure: ESOPHAGEAL MOTILITY STUDY W/INT & REP;  Surgeon: Nurse-Based Giproc;  Location: GI PROCEDURES [facility];  Service: Gastroenterology   ??? PR REPAIR BICEPS LONG TENDON Left 03/11/2016    Procedure: TENODESIS LONG TENDON BICEPS;  Surgeon: Tomasa Rand, MD;  Location: ASC OR [facility];  Service: Orthopedics   ??? PR SHLDR ARTHROSCOP,SURG,W/ROTAT CUFF REPR Left 03/11/2016  Procedure: ARTHROSCOPY, SHOULDER, SURGICAL; WITH ROTATOR CUFF REPAIR;  Surgeon: Tomasa Rand, MD;  Location: ASC OR [facility];  Service: Orthopedics   ??? PR SURGICAL ARTHROSCOPY SHOULDER XTNSV DBRDMT 3+ Left 03/11/2016    Procedure: ARTHROSCOPY SHOULDER SURG; DEBRID EXTEN;  Surgeon: Tomasa Rand, MD;  Location: ASC OR [facility];  Service: Orthopedics   ??? PR UPPER GI ENDOSCOPY,BIOPSY N/A 05/20/2014    Procedure: UGI ENDOSCOPY; WITH BIOPSY, SINGLE OR MULTIPLE;  Surgeon: Donneta Romberg, MD;  Location: GI PROCEDURES [facility];  Service: Gastroenterology   ??? PR UPPER GI ENDOSCOPY,BIOPSY N/A 10/12/2018    Procedure: UGI ENDOSCOPY; WITH BIOPSY, SINGLE OR MULTIPLE;  Surgeon: Janyth Pupa, MD;  Location: GI PROCEDURES [facility];  Service: Gastroenterology   ??? PR UPPER GI ENDOSCOPY,DIAGNOSIS N/A 11/06/2015    Procedure: UGI ENDO, INCLUDE ESOPHAGUS, STOMACH, & DUODENUM &/OR JEJUNUM; DX W/WO COLLECTION SPECIMN, BY BRUSH OR WASH;  Surgeon: Rona Ravens, MD;  Location: GI PROCEDURES [facility];  Service: Gastroenterology   ??? PR UPPER GI ENDOSCOPY,DIAGNOSIS N/A 01/31/2020    Procedure: UGI ENDO, INCLUDE ESOPHAGUS, STOMACH, & DUODENUM &/OR JEJUNUM; DX W/WO COLLECTION SPECIMN, BY BRUSH OR WASH;  Surgeon: Annie Paras, MD;  Location: GI PROCEDURES [facility];  Service: Gastroenterology     Family History   Adopted: Yes   Problem Relation Age of Onset   ??? Hepatitis Sister    ??? Rheumatologic disease Sister    ??? Parkinsonism Sister    ??? Melanoma Neg Hx    ??? Basal cell carcinoma Neg Hx    ??? Squamous cell carcinoma Neg Hx      Social History     Socioeconomic History   ??? Marital status: Single   Tobacco Use   ??? Smoking status: Every Day     Packs/day: 0.50     Years: 43.00     Pack years: 21.50     Types: Cigarettes     Start date: 01/29/1970   ??? Smokeless tobacco: Never   ??? Tobacco comments:     10 cigarettes/day   Vaping Use   ??? Vaping Use: Never used   Substance and Sexual Activity   ??? Alcohol use: Yes     Comment: has not had anything to drink in 2 months   ??? Drug use: Yes     Frequency: 2.0 times per week     Types: Marijuana     Comment: weekly use ~ 2 times week. last used 2 days ago   ??? Sexual activity: Not Currently   Other Topics Concern   ??? Do you use sunscreen? Yes   ??? Tanning bed use? No   ??? Are you easily burned? Yes   ??? Excessive sun exposure? No   ??? Blistering sunburns? Yes     Objective:     Vitals:    03/17/22 1326   BP: 108/58   Pulse: 81   Temp: 36.3 ??C (97.4 ??F)   SpO2: 97%      General appearance - alert, well appearing, and in no distress, oriented to person, place, and time and normal appearing weight. Tall!  Mental status - normal mood, behavior, speech, dress, motor activity, and thought processes  Eyes - pupils equal and reactive, extraocular eye movements intact, sclera anicteric  Chest - Symmetric rise and fall of chest. Some expiratory wheezing.   Heart - normal rate, regular rhythm, normal S1, S2, no murmurs, rubs, clicks or gallops  Abdomen - soft, nontender, nondistended, no  masses or organomegaly  Neurological - screening mental status exam normal  Musculoskeletal - no joint tenderness, deformity or swelling  Extremities - peripheral pulses normal, no pedal edema, no clubbing or cyanosis  Skin - normal coloration and turgor, no rashes, no suspicious skin lesions noted  + palmar erythema     Laboratory Studies:  Office Visit on 03/17/2022   Component Date Value Ref Range Status   ??? Albumin 03/15/2022 4.0  3.4 - 5.0 g/dL Final   ??? Total Protein 03/15/2022 7.3  5.7 - 8.2 g/dL Final   ??? Total Bilirubin 03/15/2022 0.3  0.3 - 1.2 mg/dL Final   ??? Bilirubin, Direct 03/15/2022 0.10  0.00 - 0.30 mg/dL Final   ??? AST 81/19/1478 21  <=34 U/L Final   ??? ALT 03/15/2022 11  10 - 49 U/L Final   ??? Alkaline Phosphatase 03/15/2022 74  46 - 116 U/L Final   ??? AFP-Tumor Marker 03/17/2022 2  <=8 ng/mL Final   ??? PT 03/17/2022 11.5  9.8 - 12.8 sec Final   ??? INR 03/17/2022 1.02   Final   Office Visit on 03/15/2022   Component Date Value Ref Range Status   ??? ALT 03/15/2022 11  10 - 49 U/L Final   ??? AST 03/15/2022 19  <=34 U/L Final   ??? Total Bilirubin 03/15/2022 0.4  0.3 - 1.2 mg/dL Final   ??? Sodium 29/56/2130 139  135 - 145 mmol/L Final   ??? Potassium 03/15/2022 4.1  3.4 - 4.8 mmol/L Final   ??? Chloride 03/15/2022 106  98 - 107 mmol/L Final   ??? CO2 03/15/2022 27.5  20.0 - 31.0 mmol/L Final   ??? Anion Gap 03/15/2022 6  5 - 14 mmol/L Final   ??? BUN 03/15/2022 17  9 - 23 mg/dL Final   ??? Creatinine 03/15/2022 1.01  0.60 - 1.10 mg/dL Final   ??? BUN/Creatinine Ratio 03/15/2022 17   Final   ??? eGFR CKD-EPI (2021) Male 03/15/2022 81  >=60 mL/min/1.67m2 Final   ??? Glucose 03/15/2022 102  70 - 179 mg/dL Final   ??? Calcium 86/57/8469 9.8  8.7 - 10.4 mg/dL Final   ??? HIV RNA Quant Result 03/15/2022 Detected (A)  Not Detected Final   ??? HIV RNA 03/15/2022 <20 (H)  <0 copies/mL Final   ??? HIV RNA Log10 03/15/2022    Final   ??? Adenovirus 03/15/2022 Not Detected  Not Detected Final   ??? Coronavirus HKU1 03/15/2022 Not Detected  Not Detected Final   ??? Coronavirus NL63 03/15/2022 Not Detected  Not Detected Final   ??? Coronavirus 229E 03/15/2022 Not Detected  Not Detected Final   ??? Coronavirus OC43 PCR 03/15/2022 Not Detected  Not Detected Final   ??? Metapneumovirus 03/15/2022 Not Detected  Not Detected Final   ??? Rhinovirus/Enterovirus 03/15/2022 Not Detected  Not Detected Final   ??? Influenza A 03/15/2022 Not Detected  Not Detected Final   ??? Influenza B 03/15/2022 Not Detected  Not Detected Final   ??? Parainfluenza 1 03/15/2022 Not Detected  Not Detected Final   ??? Parainfluenza 2 03/15/2022 Not Detected  Not Detected Final   ??? Parainfluenza 3 03/15/2022 Not Detected  Not Detected Final   ??? Parainfluenza 4 03/15/2022 Not Detected  Not Detected Final   ??? RSV 03/15/2022 Not Detected  Not Detected Final   ??? Bordetella pertussis 03/15/2022 Not Detected  Not Detected Final   ??? Bordetella parapertussis 03/15/2022 Not Detected  Not Detected Final   ??? Chlamydophila (Chlamydia)  pneumoni* 03/15/2022 Not Detected  Not Detected Final   ??? Mycoplasma pneumoniae 03/15/2022 Not Detected  Not Detected Final   ??? SARS-CoV-2 PCR 03/15/2022 Not Detected  Not Detected Final   ??? WBC 03/15/2022 4.9  3.6 - 11.2 10*9/L Final   ??? RBC 03/15/2022 3.73 (L)  4.26 - 5.60 10*12/L Final   ??? HGB 03/15/2022 12.8 (L)  12.9 - 16.5 g/dL Final   ??? HCT 56/21/3086 37.5 (L)  39.0 - 48.0 % Final   ??? MCV 03/15/2022 100.5 (H)  77.6 - 95.7 fL Final   ??? MCH 03/15/2022 34.3 (H)  25.9 - 32.4 pg Final   ??? MCHC 03/15/2022 34.2  32.0 - 36.0 g/dL Final   ??? RDW 57/84/6962 12.9  12.2 - 15.2 % Final   ??? MPV 03/15/2022 8.0  6.8 - 10.7 fL Final   ??? Platelet 03/15/2022 131 (L)  150 - 450 10*9/L Final   ??? nRBC 03/15/2022 0  <=4 /100 WBCs Final   ??? Neutrophils % 03/15/2022 62.8  % Final   ??? Lymphocytes % 03/15/2022 25.4  % Final   ??? Monocytes % 03/15/2022 8.5  % Final   ??? Eosinophils % 03/15/2022 2.2  % Final   ??? Basophils % 03/15/2022 1.1  % Final   ??? Absolute Neutrophils 03/15/2022 3.1  1.8 - 7.8 10*9/L Final   ??? Absolute Lymphocytes 03/15/2022 1.2  1.1 - 3.6 10*9/L Final   ??? Absolute Monocytes 03/15/2022 0.4  0.3 - 0.8 10*9/L Final   ??? Absolute Eosinophils 03/15/2022 0.1  0.0 - 0.5 10*9/L Final   ??? Absolute Basophils 03/15/2022 0.1  0.0 - 0.1 10*9/L Final   ??? CD3% (T Cells) 03/15/2022 83  61 - 86 % Final   ??? Absolute CD3 Count 03/15/2022 996 915 - 3,400 /uL Final   ??? CD4% (T Helper) 03/15/2022 37  34 - 58 % Final   ??? Absolute CD4 Count 03/15/2022 444 (L)  510 - 2,320 /uL Final   ??? CD8% T Suppressor 03/15/2022 46 (H)  12 - 38 % Final   ??? Absolute CD8 Count 03/15/2022 552  180 - 1,520 /uL Final   ??? CD4:CD8 Ratio 03/15/2022 0.8 (L)  0.9 - 4.8 Final   ]    Assessment/Plan:   1.  Well compensated cirrhosis secondary to HCV: s/p cure in 2016  Mr. Shravan Salahuddin is 66 y.o. male whose chief complaint is continued cirrhosis care. History of HCV, prior treatment exposure with interferon. He is successfully treated and cured ~ Sofosvuvir and Dalkinza x 24 weeks. He is coinfected with HIV. HIV RNA level has been well suppressed with current medications. PMH of well compensated cirrhosis based on continued evidence of good synthetic liver function and no events (-ascites, - HE, - upper GI bleed)    ~ Laboratory orders reviewed from recent visit with a few add- ons for MELD labs + PEth  ~ Discussed continued alcohol use but congratulated on cutting back with goal of abstinence.   ~continued MJ use. Discussed trying edibles vs smoking as better alternative   ~ Advised to avoid NSAIDs, 2 grams tylenol okay.   ~HCC screening: [place] ordered. Was getting MRIs for unclear reasons. Will attempt screening with [place] and recommend q 6 months with AFP.   ~Variceal screening: EGD 2021: no varices: Due 2024. Ongoing thrombocytopenia  ~ Recommend Bone Density re-screen with PCP. Last done 2019 (normal with mildly low bone mass in femur)  Vit D level today. Discuss next visit.  It was my pleasure caring for this patient. Please reach out with any questions.     Reeves Forth. Arth Nicastro, ANP- [facility]  [facility] Liver Program  [address]  [place] [address]  [phone]

## 2022-03-21 LAB — VITAMIN D 25 HYDROXY: VITAMIN D, TOTAL (25OH): 31.8 ng/mL (ref 20.0–80.0)

## 2022-03-23 LAB — PHOSPHATIDYLETHANOL (PETH): PETH 16:0/18:1 (POPETH) BY LC-MS/MS: <10 ng/mL

## 2022-04-08 NOTE — Unmapped (Signed)
[facility] Clinical Assessment & Refill Coordination Note    Andrew Oconnell, DOB: 08-06-1956  Phone: [phone] (home)     All above HIPAA information was verified with patient.     Was a Nurse, learning disability used for this call? No    Specialty Medication(s):   Infectious Disease: Descovy and Tivicay     Current Outpatient Medications   Medication Sig Dispense Refill    albuterol HFA 90 mcg/actuation inhaler Inhale 1 puff every six (6) hours as needed for wheezing or shortness of breath. 8.5 g 0    ARIPiprazole (ABILIFY) 5 MG tablet Take 1 tablet (5 mg total) by mouth daily. 90 tablet 0    atorvastatin (LIPITOR) 10 MG tablet Take 1 tablet (10 mg total) by mouth daily. 90 tablet 0    dolutegravir (TIVICAY) 50 mg TABLET Take 1 tablet (50 mg total) by mouth daily. 30 tablet 11    emtricitabine-tenofovir alafen (DESCOVY) 200-25 mg tablet Take 1 tablet by mouth daily. 30 tablet 11    fluticasone (FLONASE) 50 mcg/actuation nasal spray 1 spray by Each Nare route daily. 16 g 0    ipratropium (ATROVENT) 42 mcg (0.06 %) nasal spray Use 2 sprays into each nostril four (4) times a day. 15 mL 2    lamoTRIgine (LAMICTAL) 200 MG tablet Take 1 tablet (200 mg total) by mouth daily. 30 tablet 3    losartan (COZAAR) 25 MG tablet Take 1 tablet (25 mg total) by mouth daily. 90 tablet 0    naproxen sodium (ALEVE) 220 MG tablet Take 1 tablet (220 mg total) by mouth daily as needed for pain.      tiotropium-olodateroL (STIOLTO RESPIMAT) 2.5-2.5 mcg/actuation Mist Inhale 2 puffs daily. 4 g 11    traZODone (DESYREL) 100 MG tablet Take 2.5 tablets (250 mg total) by mouth nightly. 250 tablet 0     No current facility-administered medications for this visit.        Changes to medications: Andrew Oconnell reports no changes at this time.    Allergies   Allergen Reactions    Pollen Extracts      Congestion      Sulfa (Sulfonamide Antibiotics) Rash       Changes to allergies: No    SPECIALTY MEDICATION ADHERENCE     Descovy 200-25 mg: approximately 30 days of medicine on hand   Tivicay 50 mg: approximatley 30 days of medicine on hand     Medication Adherence    Patient reported X missed doses in the last month: 0  Specialty Medication: Descovy 200-25mg   Patient is on additional specialty medications: Yes  Additional Specialty Medications: Tivicay 50mg   Patient Reported Additional Medication X Missed Doses in the Last Month: 0  Patient is on more than two specialty medications: No  Any gaps in refill history greater than 2 weeks in the last 3 months: no  Demonstrates understanding of importance of adherence: yes  Informant: patient  Provider-estimated medication adherence level: good  Patient is at risk for Non-Adherence: No          Specialty medication(s) dose(s) confirmed: Regimen is correct and unchanged.     Are there any concerns with adherence? No    Adherence counseling provided? Not needed    CLINICAL MANAGEMENT AND INTERVENTION      Clinical Benefit Assessment:    Do you feel the medicine is effective or helping your condition? Yes    HIV ASSOCIATED LABS:     Lab Results  Component Value Date/Time    HIVRS Detected (A) 03/15/2022 11:31 AM    HIVRS Detected (A) 10/14/2021 10:09 AM    HIVRS Not Detected 07/12/2021 10:16 AM    HIVRS <40 08/24/2017 12:00 AM    HIVRS Not Detected 07/29/2014 12:50 PM    HIVRS Not Detected 02/18/2014 11:42 AM    HIVCP <20 (H) 03/15/2022 11:31 AM    HIVCP <40 (H) 10/14/2021 10:09 AM    HIVCP 258 (H) 03/02/2021 11:32 AM    HIVCP <40 copies/mL 03/25/2020 12:00 AM    HIVCP <40 11/29/2016 12:00 AM    HIVCP <40 12/01/2015 12:00 AM    RCD4 36.1 03/25/2020 12:00 AM    RCD4 31.5 06/28/2016 12:00 AM    RCD4 29.6 12/01/2015 12:00 AM    ACD4 444 (L) 03/15/2022 11:31 AM    ACD4 429 (L) 10/14/2021 10:09 AM    ACD4 288 (L) 07/12/2021 10:16 AM    ACD4 433 (A) 03/25/2020 12:00 AM    ACD4 630 06/28/2016 12:00 AM    ACD4 710 12/01/2015 12:00 AM       Clinical Benefit counseling provided? Labs from 03/15/22 show evidence of clinical benefit    Adverse Effects Assessment:    Are you experiencing any side effects? No    Are you experiencing difficulty administering your medicine? No    Quality of Life Assessment:      How many days over the past month did your HIV  keep you from your normal activities? For example, brushing your teeth or getting up in the morning. 0    Have you discussed this with your provider? Not needed    Acute Infection Status:    Acute infections noted within Epic:  No active infections  Patient reported infection: None    Therapy Appropriateness:    Is therapy appropriate and patient progressing towards therapeutic goals? Yes, therapy is appropriate and should be continued    DISEASE/MEDICATION-SPECIFIC INFORMATION      N/A    PATIENT SPECIFIC NEEDS     Does the patient have any physical, cognitive, or cultural barriers? No    Is the patient high risk? No    Does the patient require a Care Management Plan? No       SHIPPING     Specialty Medication(s) to be Shipped:   Infectious Disease: Descovy and Tivicay    Other medication(s) to be shipped:  Stiolto Respimat     Changes to insurance: No    Delivery Scheduled: Yes, Expected medication delivery date: 04/15/22.     Medication will be delivered via UPS to the confirmed prescription address in [facility].    The patient will receive a drug information handout for each medication shipped and additional FDA Medication Guides as required.  Verified that patient has previously received a Conservation officer, historic buildings and a Surveyor, mining.    The patient or caregiver noted above participated in the development of this care plan and knows that they can request review of or adjustments to the care plan at any time.      All of the patient's questions and concerns have been addressed.    [name]   [facility] Specialty Pharmacist

## 2022-04-14 MED FILL — TIVICAY 50 MG TABLET: ORAL | 30 days supply | Qty: 30 | Fill #1

## 2022-04-14 MED FILL — STIOLTO RESPIMAT 2.5 MCG-2.5 MCG/ACTUATION SOLUTION FOR INHALATION: RESPIRATORY_TRACT | 30 days supply | Qty: 4 | Fill #1

## 2022-04-14 MED FILL — DESCOVY 200 MG-25 MG TABLET: ORAL | 30 days supply | Qty: 30 | Fill #1

## 2022-04-26 ENCOUNTER — Ambulatory Visit: Admit: 2022-04-26 | Discharge: 2022-04-26 | Payer: MEDICARE

## 2022-04-26 DIAGNOSIS — R932 Abnormal findings on diagnostic imaging of liver and biliary tract: Secondary | ICD-10-CM | POA: Diagnosis not present

## 2022-04-26 DIAGNOSIS — K8020 Calculus of gallbladder without cholecystitis without obstruction: Secondary | ICD-10-CM | POA: Diagnosis not present

## 2022-04-26 DIAGNOSIS — K838 Other specified diseases of biliary tract: Secondary | ICD-10-CM | POA: Diagnosis not present

## 2022-04-26 DIAGNOSIS — K7460 Unspecified cirrhosis of liver: Secondary | ICD-10-CM | POA: Diagnosis not present

## 2022-04-26 DIAGNOSIS — R161 Splenomegaly, not elsewhere classified: Secondary | ICD-10-CM | POA: Diagnosis not present

## 2022-05-13 DIAGNOSIS — E7800 Pure hypercholesterolemia, unspecified: Principal | ICD-10-CM

## 2022-05-13 DIAGNOSIS — I10 Essential (primary) hypertension: Principal | ICD-10-CM

## 2022-05-13 MED ORDER — ATORVASTATIN 10 MG TABLET
ORAL_TABLET | Freq: Every day | ORAL | 0 refills | 90 days
Start: 2022-05-13 — End: ?

## 2022-05-13 MED ORDER — ARIPIPRAZOLE 5 MG TABLET
ORAL_TABLET | Freq: Every day | ORAL | 0 refills | 90 days
Start: 2022-05-13 — End: ?

## 2022-05-13 MED ORDER — TRAZODONE 100 MG TABLET
ORAL_TABLET | Freq: Every evening | ORAL | 0 refills | 100 days | Status: CP
Start: 2022-05-13 — End: ?
  Filled 2022-05-17: qty 225, 90d supply, fill #0

## 2022-05-13 MED ORDER — LOSARTAN 25 MG TABLET
ORAL_TABLET | Freq: Every day | ORAL | 0 refills | 90 days
Start: 2022-05-13 — End: ?

## 2022-05-13 NOTE — Unmapped (Signed)
Medication Requested:       Future Appointments   Date Time Provider Department Center   05/26/2022  9:30 AM Russ Halo, MD [facility]   06/13/2022  1:00 PM Marnette Burgess Lucy Chris, MD [facility]   09/20/2022 12:30 PM Reeves Forth [place], AGNP [facility]     Per Provider Note: on current med list     Standing order protocol requirements met?: Yes    Sent to: Pharmacy per protocol    Days Supply Given: 30 days  Number of Refills: 0

## 2022-05-13 NOTE — Unmapped (Signed)
[facility] Refill Coordination Note    Specialty Medication(s) to be Shipped:   Infectious Disease: Descovy and Tivicay    Other medication(s) to be shipped: aripiprazole  Atorvastatin  Losartan  stiolto  Trazodone       Andrew Oconnell, DOB: 1956/01/28  Phone: [phone] (home)       All above HIPAA information was verified with patient.     Was a Nurse, learning disability used for this call? No    Completed refill call assessment today to schedule patient's medication shipment from the [facility] [phone]).  All relevant notes have been reviewed.     Specialty medication(s) and dose(s) confirmed: Regimen is correct and unchanged.   Changes to medications: Shaunak reports no changes at this time.  Changes to insurance: No  New side effects reported not previously addressed with a pharmacist or physician: None reported  Questions for the pharmacist: No    Confirmed patient received a Conservation officer, historic buildings and a Surveyor, mining with first shipment. The patient will receive a drug information handout for each medication shipped and additional FDA Medication Guides as required.       DISEASE/MEDICATION-SPECIFIC INFORMATION        N/A    SPECIALTY MEDICATION ADHERENCE     Medication Adherence    Patient reported X missed doses in the last month: 0  Specialty Medication: tivicay  Patient is on additional specialty medications: Yes  Additional Specialty Medications: descovy  Patient Reported Additional Medication X Missed Doses in the Last Month: 0              Were doses missed due to medication being on hold? No    tivicay 50mg   : 20 days of medicine on hand   descovy 200-25mg   : 20 days of medicine on hand       REFERRAL TO PHARMACIST     Referral to the pharmacist: Not needed      [facility]     Shipping address confirmed in Epic.     Delivery Scheduled: Yes, Expected medication delivery date: 8/2.     Medication will be delivered via UPS to the prescription address in Epic WAM.    Westley Gambles   [facility] Specialty Technician

## 2022-05-16 MED ORDER — LOSARTAN 25 MG TABLET
ORAL_TABLET | Freq: Every day | ORAL | 0 refills | 90 days
Start: 2022-05-16 — End: ?

## 2022-05-16 MED ORDER — ATORVASTATIN 10 MG TABLET
ORAL_TABLET | Freq: Every day | ORAL | 0 refills | 90 days
Start: 2022-05-16 — End: ?

## 2022-05-17 DIAGNOSIS — I10 Essential (primary) hypertension: Principal | ICD-10-CM

## 2022-05-17 DIAGNOSIS — E7800 Pure hypercholesterolemia, unspecified: Principal | ICD-10-CM

## 2022-05-17 MED ORDER — LOSARTAN 25 MG TABLET
ORAL_TABLET | Freq: Every day | ORAL | 0 refills | 90.00000 days
Start: 2022-05-17 — End: ?

## 2022-05-17 MED ORDER — ATORVASTATIN 10 MG TABLET
ORAL_TABLET | Freq: Every day | ORAL | 0 refills | 90.00000 days
Start: 2022-05-17 — End: ?

## 2022-05-17 MED ORDER — ARIPIPRAZOLE 5 MG TABLET
ORAL_TABLET | Freq: Every day | ORAL | 0 refills | 90 days | Status: CP
Start: 2022-05-17 — End: ?
  Filled 2022-05-17: qty 90, 90d supply, fill #0

## 2022-05-17 MED FILL — TIVICAY 50 MG TABLET: ORAL | 30 days supply | Qty: 30 | Fill #2

## 2022-05-17 MED FILL — STIOLTO RESPIMAT 2.5 MCG-2.5 MCG/ACTUATION SOLUTION FOR INHALATION: RESPIRATORY_TRACT | 30 days supply | Qty: 4 | Fill #2

## 2022-05-17 MED FILL — DESCOVY 200 MG-25 MG TABLET: ORAL | 30 days supply | Qty: 30 | Fill #2

## 2022-05-23 DIAGNOSIS — J449 Chronic obstructive pulmonary disease, unspecified: Principal | ICD-10-CM

## 2022-05-23 MED ORDER — ALBUTEROL SULFATE HFA 90 MCG/ACTUATION AEROSOL INHALER
Freq: Four times a day (QID) | RESPIRATORY_TRACT | 0 refills | 50 days | PRN
Start: 2022-05-23 — End: ?

## 2022-05-24 MED ORDER — ALBUTEROL SULFATE HFA 90 MCG/ACTUATION AEROSOL INHALER
Freq: Four times a day (QID) | RESPIRATORY_TRACT | 0 refills | 50 days | Status: CP | PRN
Start: 2022-05-24 — End: ?
  Filled 2022-05-25: qty 8.5, 50d supply, fill #0

## 2022-05-24 MED ORDER — ATORVASTATIN 10 MG TABLET
ORAL_TABLET | Freq: Every day | ORAL | 2 refills | 90 days
Start: 2022-05-24 — End: ?

## 2022-05-24 MED ORDER — LOSARTAN 25 MG TABLET
ORAL_TABLET | Freq: Every day | ORAL | 1 refills | 90 days
Start: 2022-05-24 — End: ?

## 2022-05-24 NOTE — Unmapped (Signed)
Medication Requested: albuterol      Future Appointments   Date Time Provider Department Center   05/26/2022  9:30 AM Russ Halo, MD [facility]   06/13/2022  1:00 PM Marnette Burgess Lucy Chris, MD [facility]   09/20/2022 12:30 PM Reeves Forth Yoxheimer, AGNP [facility]     Per Provider Note:His PRN albuterol has actually decreased since his last visit      Standing order protocol requirements met?: Yes    Sent to: Pharmacy per protocol    Days Supply Given: 30 days  Number of Refills: 0

## 2022-05-25 MED FILL — LOSARTAN 25 MG TABLET: ORAL | 90 days supply | Qty: 90 | Fill #0

## 2022-05-25 MED FILL — ATORVASTATIN 10 MG TABLET: ORAL | 90 days supply | Qty: 90 | Fill #0

## 2022-05-26 ENCOUNTER — Ambulatory Visit
Admit: 2022-05-26 | Discharge: 2022-05-27 | Payer: MEDICARE | Attending: Student in an Organized Health Care Education/Training Program | Primary: Student in an Organized Health Care Education/Training Program

## 2022-05-26 DIAGNOSIS — Z6824 Body mass index (BMI) 24.0-24.9, adult: Secondary | ICD-10-CM | POA: Diagnosis not present

## 2022-05-26 DIAGNOSIS — F3131 Bipolar disorder, current episode depressed, mild: Secondary | ICD-10-CM | POA: Diagnosis not present

## 2022-05-26 DIAGNOSIS — F4310 Post-traumatic stress disorder, unspecified: Secondary | ICD-10-CM | POA: Diagnosis not present
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
## 2022-06-17 DIAGNOSIS — F32A Depression, unspecified depression type: Principal | ICD-10-CM

## 2022-06-17 MED ORDER — LAMOTRIGINE 200 MG TABLET
ORAL_TABLET | Freq: Every day | ORAL | 3 refills | 30 days | Status: CP
Start: 2022-06-17 — End: ?
  Filled 2022-06-21: qty 30, 30d supply, fill #0

## 2022-06-17 NOTE — Unmapped (Signed)
[facility] Refill Coordination Note    Specialty Medication(s) to be Shipped:   Infectious Disease: Descovy and Tivicay    Other medication(s) to be shipped: ipratropium ns  Lamotrigine  stiolto       Andrew Oconnell, DOB: Apr 18, 1956  Phone: [phone] (home)       All above HIPAA information was verified with patient.     Was a Nurse, learning disability used for this call? No    Completed refill call assessment today to schedule patient's medication shipment from the [facility] [phone]).  All relevant notes have been reviewed.     Specialty medication(s) and dose(s) confirmed: Regimen is correct and unchanged.   Changes to medications: Andrew Oconnell reports no changes at this time.  Changes to insurance: No  New side effects reported not previously addressed with a pharmacist or physician: None reported  Questions for the pharmacist: No    Confirmed patient received a Conservation officer, historic buildings and a Surveyor, mining with first shipment. The patient will receive a drug information handout for each medication shipped and additional FDA Medication Guides as required.       DISEASE/MEDICATION-SPECIFIC INFORMATION        N/A    SPECIALTY MEDICATION ADHERENCE     Medication Adherence    Patient reported X missed doses in the last month: 0  Specialty Medication: tivicay 50mg   Patient is on additional specialty medications: Yes  Additional Specialty Medications: Descovy 200-25mg   Patient Reported Additional Medication X Missed Doses in the Last Month: 0                          Were doses missed due to medication being on hold? No    tivicay 50mg   : 20 days of medicine on hand   descovy 200-25mg   : 20 days of medicine on hand       REFERRAL TO PHARMACIST     Referral to the pharmacist: Not needed      [facility]     Shipping address confirmed in Epic.     Delivery Scheduled: Yes, Expected medication delivery date: 9/6.     Medication will be delivered via UPS to the prescription address in Epic WAM.    Andrew Oconnell   [facility] Specialty Technician

## 2022-06-17 NOTE — Unmapped (Signed)
Medication Requested: lamictal      Future Appointments   Date Time Provider Department Center   09/20/2022 12:30 PM Allena Earing, AGNP [facility]     Per Provider Note: Currently on Lamictal 200mg  daily     Standing order protocol requirements met?: No    Sent to: Provider for signing    Days Supply Given: 0  Number of Refills: 0

## 2022-06-21 MED FILL — TIVICAY 50 MG TABLET: ORAL | 30 days supply | Qty: 30 | Fill #3

## 2022-06-21 MED FILL — STIOLTO RESPIMAT 2.5 MCG-2.5 MCG/ACTUATION SOLUTION FOR INHALATION: RESPIRATORY_TRACT | 30 days supply | Qty: 4 | Fill #3

## 2022-06-21 MED FILL — DESCOVY 200 MG-25 MG TABLET: ORAL | 30 days supply | Qty: 30 | Fill #3

## 2022-06-21 MED FILL — IPRATROPIUM BROMIDE 42 MCG (0.06 %) NASAL SPRAY: NASAL | 21 days supply | Qty: 15 | Fill #1

## 2022-06-22 DIAGNOSIS — F32A Depression, unspecified depression type: Principal | ICD-10-CM

## 2022-06-22 MED ORDER — LAMOTRIGINE 200 MG TABLET
ORAL_TABLET | Freq: Every day | ORAL | 3 refills | 0.00000 days
Start: 2022-06-22 — End: ?

## 2022-06-23 MED ORDER — LAMOTRIGINE 200 MG TABLET
ORAL_TABLET | Freq: Every day | ORAL | 3 refills | 30 days
Start: 2022-06-23 — End: ?

## 2022-07-04 ENCOUNTER — Ambulatory Visit: Admit: 2022-07-04 | Discharge: 2022-07-05 | Payer: MEDICARE

## 2022-07-04 DIAGNOSIS — Z720 Tobacco use: Principal | ICD-10-CM

## 2022-07-04 DIAGNOSIS — Z129 Encounter for screening for malignant neoplasm, site unspecified: Principal | ICD-10-CM

## 2022-07-04 DIAGNOSIS — Z23 Encounter for immunization: Secondary | ICD-10-CM | POA: Diagnosis not present

## 2022-07-04 DIAGNOSIS — J309 Allergic rhinitis, unspecified: Secondary | ICD-10-CM | POA: Diagnosis not present

## 2022-07-04 DIAGNOSIS — J432 Centrilobular emphysema: Secondary | ICD-10-CM | POA: Diagnosis not present

## 2022-07-04 NOTE — Unmapped (Signed)
HIV-PULMONARY CLINIC VISIT:    Patient: Andrew Oconnell(1956-01-24)   Reason for visit: COPD    HISTORY OF PRESENT ILLNESS:     Andrew Oconnell is a 66 year old gentleman with a PMH as below including HIV on ART, tobacco use and COPD.     HISTORY OF PRESENT ILLNESS (06/2020):   Andrew Oconnell reports that he was first diagnosed with COPD in 2005. He had previously been seeing a pulmonologist in [place] and has been on Advair with prn albuterol for a few years though he ran out of the Advair a few months ago. He reports that he has had some dyspnea on exertion for the past few months and reports an mMRC score of 2 today. He uses his rescue inhaler 2-3 times per week. He denies any cough but does endorse morning sputum production that is typically clear and never has blood. He denies any wheezing or chest tightness. He has chronic rhinosinusitis, for which he follows with ENT - currently managed with sinus rinses and oral antihistamines. He denies any fevers or chills and reports occasional mild night sweats that have been stable for years. He reports weight gain of 3 pounds in the last month. He denies any lower extremity swelling. He has not required antibiotics or oral steroids in the last 2-3 years for a COPD exacerbation.    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 He reports that his respiratory symptoms remain very well controlled. His mMRC score today is 1.     INTERVAL HISTORY (11/2021):  His mMRC score is 1 today and he is presently using his albuterol inhaler 1-2 times a month, with relief. He saw ENT in November who prescribed nasal hygeine - he has been doing the nasal spray mostly consistently.     INTERVAL HISTORY:  Andrew Oconnell continues to report feeling overall well. His mMRC score today is 1. He denies any cough and minimal DOE. His allergic rhinitis symptoms continue wax and wane. He has been using the Flonase daily and the Claritin. He was recommended sinus irrigation by ENT but hated it. He has the Atrovent nasal spray they recommended at home, but hasn't been using it. He has not followed up with them. He reports that he is compliant with his Stiolto and uses his albuterol rescue inhaler at most, once a week. He denies any fevers, chills, chest pain, changes in his weight or swelling in his legs.      Medication Adherence: Adherent all of the time.  Inhaler Technique: Using inhaler properly.    Pulmonary medications: Stiolto, prn Albuterol    HIV status:  Date: CD4 Viral load   10/2018 543 ND   10/2019 593 ND   03/2020  <40   04/2021 385 ND   09/2021 429 <40   02/2022 444 <20     Current regimen: Tivicay and Descovy    REVIEW OF SYSTEMS: All systems were reviewed and found to be negative except as above in the HPI.    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) 02/02/2021    Reflux     Vomiting     after eating     Past Surgical History:   Procedure Laterality Date    BACK SURGERY N/A 1990    LIGATION / DIVISION SAPHENOUS VEIN Bilateral     90s    PR COLONOSCOPY W/BIOPSY SINGLE/MULTIPLE  12/22/2021    Procedure: COLONOSCOPY, FLEXIBLE, PROXIMAL TO SPLENIC FLEXURE; WITH BIOPSY, SINGLE OR MULTIPLE;  Surgeon: Luanne Bras, MD;  [facility];  Service: Gastroenterology    PR COLSC FLX W/RMVL OF TUMOR POLYP LESION SNARE TQ N/A 05/08/2015    Procedure: COLONOSCOPY FLEX; W/REMOV TUMOR/LES BY SNARE;  Surgeon: Teodoro Spray, MD;  [facility]: [facility];  Service: Gastroenterology    PR COLSC FLX W/RMVL OF TUMOR POLYP LESION SNARE TQ Left 12/22/2021    Procedure: COLONOSCOPY FLEX; W/REMOV TUMOR/LES BY SNARE;  Surgeon: Luanne Bras, MD;  Location: [facility];  Service: Gastroenterology    PR ESOPHAGEAL MOTILITY STUDY, MANOMETRY N/A 06/04/2014    Procedure: ESOPHAGEAL MOTILITY STUDY W/INT & REP;  Surgeon: Nurse-Based Giproc;  Location: GI PROCEDURES [facility];  Service: Gastroenterology    PR REPAIR BICEPS LONG TENDON Left 03/11/2016    Procedure: TENODESIS LONG TENDON BICEPS;  Surgeon: Tomasa Rand, MD;  Location: [facility];  Service: Orthopedics    PR SHLDR ARTHROSCOP,SURG,W/ROTAT CUFF REPR Left 03/11/2016    Procedure: ARTHROSCOPY, SHOULDER, SURGICAL; WITH ROTATOR CUFF REPAIR;  Surgeon: Tomasa Rand, MD;  Location: ASC OR [facility];  Service: Orthopedics    PR SURGICAL ARTHROSCOPY SHOULDER XTNSV DBRDMT 3+ Left 03/11/2016    Procedure: ARTHROSCOPY SHOULDER SURG; DEBRID EXTEN;  Surgeon: Tomasa Rand, MD;  Location: ASC OR [facility];  Service: Orthopedics    PR UPPER GI ENDOSCOPY,BIOPSY N/A 05/20/2014    Procedure: UGI ENDOSCOPY; WITH BIOPSY, SINGLE OR MULTIPLE;  Surgeon: Donneta Romberg, MD;  [facility]: [facility] PROC[facility]URES [facility];  Service: Gastroenterology    PR UPPER GI ENDOSCOPY,BIOPSY N/A 10/12/2018    Procedure: UGI ENDOSCOPY; WITH BIOPSY, SINGLE OR MULTIPLE;  Surgeon: Janyth Pupa, MD;  Location: GI PROCEDURES [facility];  Service: Gastroenterology    PR UPPER GI ENDOSCOPY,DIAGNOSIS N/A 11/06/2015    Procedure: UGI ENDO, INCLUDE ESOPHAGUS, STOMACH, & DUODENUM &/OR JEJUNUM; DX W/WO COLLECTION SPECIMN, BY BRUSH OR WASH;  Surgeon: Rona Ravens, MD;  Location: GI PROCEDURES [facility];  Service: Gastroenterology    PR UPPER GI ENDOSCOPY,DIAGNOSIS N/A 01/31/2020    Procedure: UGI ENDO, INCLUDE ESOPHAGUS, STOMACH, & DUODENUM &/OR JEJUNUM; DX W/WO COLLECTION SPECIMN, BY BRUSH OR WASH;  Surgeon: Annie Paras, MD;  Location: GI PROCEDURES [facility];  Service: Gastroenterology       SOCIAL AND FAMILY HISTORY:   Social history: He lives at home with a friend and they have a pet dog and cat. He is currently retired but previously worked for [employer]. 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 He is a current smoker and started at the age of 89. He has smoked on average 0.5 packs per day (current 25 pack-year history). He also reports inhaled cannabis use a few times a week. He is back down to 10 cpd (from 1 ppd at last visit). He is currently using Cannabis 2-3 times a week.     Family History: Adopted, no known history.    MEDICATIONS  AND ALLERGIES:     CURRENT MEDICATIONS:  Outpatient Encounter Medications as of 07/04/2022   Medication Sig Dispense Refill    albuterol HFA 90 mcg/actuation inhaler Inhale 1 puff every six (6) hours as needed for wheezing or shortness of breath. 8.5 g 0    ARIPiprazole (ABILIFY) 5 MG tablet Take 1 tablet (5 mg total) by mouth daily. 90 tablet 0    atorvastatin (LIPITOR) 10 MG tablet TAKE 1 TABLET BY MOUTH ONCE DAILY FOR HIGH CHOLESTEROL 90 tablet 2    dolutegravir (TIVICAY) 50 mg TABLET Take 1 tablet (50 mg total) by mouth daily. 30 tablet 11    emtricitabine-tenofovir alafen (DESCOVY) 200-25 mg tablet Take 1 tablet by mouth daily. 30 tablet 11    fluticasone (FLONASE) 50 mcg/actuation nasal spray 1 spray by Each Nare route daily. 16 g 0    ipratropium (ATROVENT) 42 mcg (0.06 %) nasal spray Use 2 sprays into each nostril four (4) times a day. 15 mL 2    lamoTRIgine (LAMICTAL) 200 MG tablet Take 1 tablet (200 mg total) by mouth daily. 30 tablet 3    losartan (COZAAR) 25 MG tablet Take 1 tablet by mouth once a day 90 tablet 1    naproxen sodium (ALEVE) 220 MG tablet Take 1 tablet (220 mg total) by mouth daily as needed for pain.      tiotropium-olodateroL (STIOLTO RESPIMAT) 2.5-2.5 mcg/actuation Mist Inhale 2 puffs daily. 4 g 11    traZODone (DESYREL) 100 MG tablet Take 2.5 tablets (250 mg total) by mouth nightly. 250 tablet 0     No facility-administered encounter medications on file as of 07/04/2022.       ALLERGIES:  Allergies as of 07/04/2022 - Reviewed 06/30/2022   Allergen Reaction Noted    Pollen extracts  03/09/2017    Sulfa (sulfonamide antibiotics) Rash 01/29/2013       PHYSICAL EXAM:     Vitals:    07/04/22 1121   BP: 90/54   BP Site: L Arm   BP Position: Sitting   BP Cuff Size: Medium   Pulse: 94   Resp: 18   Temp: 36.8 ??C (98.2 ??F)   TempSrc: Oral   Weight: 92.1 kg (203 lb)      Body mass index is 24.07 kg/m??.    GENERAL: well nourished, cooperative, no acute distress  EYES: anicteric, noninjected, EOMi  HEENT: Neck supple, trachea midline, moist mucus membranes, oropharynx without lesions or thrush  CV: Regular rate, normal rhythm, no murmurs/rubs/gallops  PULM: Clear to auscultation bilaterally. No dullness to percussion. Normal excursion. Normal work of breathing.   ABD: Soft, nontender, nondistended.   EXTREMITIES: No digital clubbing. No edema.  SKIN: No rashes, lesions, or skin breakdown. Warm and well perfused.  NEURO: No focal neurologic deficits. Moves all extremities and follows commands.   PSYCH: Well groomed, appropriate mood and affect, good eye contact.       ANCILLARY DATA:     All imaging and labs were reviewed personally.    Date: FVC (% Pred) FEV1 (% Pred)  Pre-BD FEV1 (%Pred)  Post- BD FEV1/FVC DLCO (% Pred) VC (% Pred) TLC (% Pred) RV (%Pred)   August 2012 6.34 (107%) 3.10 (69%) 3.29 (73%) 49 (76) 54%      January 2022 6.49 (117%) 2.91 (70%) 2.92 (71%) 45 (63) 79%        CT LCS (11/2019): Non-calcified RML 0.4 cm, Lung-RADS 2.  CT LCS (12/2020): 0.4 cm RML nodule unchanged. New 0.3  LUL nodule. Lung-RADS 2.  CT LCS (01/2022): Severe emphysema with large R anterior bulla. Stable 4 mm RML nodule.     CT sinus (11/2019): Moderate degree of mucosal thickening in the ethmoidal sinuses with mild mucosal thickening seen in the rest of the sinuses. Small right maxillary sinus noted. IgE (06/2020): 114      ASSESSMENT AND PLAN:     Andrew Oconnell is a 66 year old gentleman with a PMH as below including HIV on ART, tobacco use and group IIA COPD.     His mMRC score indicates well-controlled disease on Stiolto. His PRN albuterol use is relatively stable. We will continue his current LAMA/LABA regimen. We again discussed the importance of tobacco cessation particularly on preventing continued lung function decline but he is not interested today. I provided information on the Tobacco Treatment Program in the HIV clinic for him to consider in the future.     For his allergic rhinitis, I recommend that he start the intranasal Atrovent to see if this helps, as he has not been able to tolerate the sinus irrigation. If he is still having symptoms despite this, I counseled him to follow up with ENT. He reports that he has the clinic contact information.     He is up to date on his TDaP and pneumonia vaccines. We will give him the influenza vaccine today and I recommended that he get the updated COVID vaccine locally when available.     He is engaged in lung cancer screening and his next CT is due in April 2024. It has already been ordered.     I will have him follow up in 6 months.    I personally spent 21 minutes face-to-face and non-face-to-face in the care of this patient, which includes all pre, intra, and post visit time on the date of service.

## 2022-07-15 NOTE — Unmapped (Signed)
[facility] Refill Coordination Note    Specialty Medication(s) to be Shipped:   Infectious Disease: Descovy and Tivicay    Other medication(s) to be shipped: ipratropium ns  Lamotrigine  stiolto       Andrew Oconnell, DOB: 06-10-1956  Phone: [phone] (home)       All above HIPAA information was verified with patient.     Was a Nurse, learning disability used for this call? No    Completed refill call assessment today to schedule patient's medication shipment from the [facility] [phone]).  All relevant notes have been reviewed.     Specialty medication(s) and dose(s) confirmed: Regimen is correct and unchanged.   Changes to medications: Andrew Oconnell reports no changes at this time.  Changes to insurance: No  New side effects reported not previously addressed with a pharmacist or physician: None reported  Questions for the pharmacist: No    Confirmed patient received a Conservation officer, historic buildings and a Surveyor, mining with first shipment. The patient will receive a drug information handout for each medication shipped and additional FDA Medication Guides as required.       DISEASE/MEDICATION-SPECIFIC INFORMATION        N/A    SPECIALTY MEDICATION ADHERENCE     Medication Adherence    Specialty Medication: DESCOVY 200-25 mg  Patient is on additional specialty medications: Yes  Additional Specialty Medications: TIVICAY 50 mg    Patient Reported Additional Medication X Missed Doses in the Last Month: 0  Patient is on more than two specialty medications: No  Any gaps in refill history greater than 2 weeks in the last 3 months: no  Demonstrates understanding of importance of adherence: yes                          Were doses missed due to medication being on hold? No    tivicay 50mg   : 20 days of medicine on hand   descovy 200-25mg   : 20 days of medicine on hand       REFERRAL TO PHARMACIST     Referral to the pharmacist: Not needed      [facility]     Shipping address confirmed in Epic.     Delivery Scheduled: Yes, Expected medication delivery date: 07/26/22 .     Medication will be delivered via UPS to the prescription address in Epic W[facility].    Andrew Oconnell   [facility] Specialty Technician

## 2022-07-19 MED ORDER — ARIPIPRAZOLE 5 MG TABLET
ORAL_TABLET | Freq: Every day | ORAL | 0 refills | 90 days | Status: CP
Start: 2022-07-19 — End: ?
  Filled 2022-07-25: qty 90, 90d supply, fill #0

## 2022-07-25 MED FILL — IPRATROPIUM BROMIDE 42 MCG (0.06 %) NASAL SPRAY: NASAL | 21 days supply | Qty: 15 | Fill #2

## 2022-07-25 MED FILL — TRAZODONE 100 MG TABLET: ORAL | 10 days supply | Qty: 25 | Fill #1

## 2022-07-25 MED FILL — TIVICAY 50 MG TABLET: ORAL | 30 days supply | Qty: 30 | Fill #4

## 2022-07-25 MED FILL — LAMOTRIGINE 200 MG TABLET: ORAL | 30 days supply | Qty: 30 | Fill #1

## 2022-07-25 MED FILL — STIOLTO RESPIMAT 2.5 MCG-2.5 MCG/ACTUATION SOLUTION FOR INHALATION: RESPIRATORY_TRACT | 30 days supply | Qty: 4 | Fill #4

## 2022-07-25 MED FILL — DESCOVY 200 MG-25 MG TABLET: ORAL | 30 days supply | Qty: 30 | Fill #4

## 2022-08-07 MED ORDER — TRAZODONE 100 MG TABLET
ORAL_TABLET | Freq: Every evening | ORAL | 0 refills | 100 days
Start: 2022-08-07 — End: ?

## 2022-08-08 NOTE — Unmapped (Signed)
Medication Requested: Trazodone 100 mg tablet     Last visit: 05/26/2022    Future Appointments   Date Time Provider Department Center   09/20/2022 12:30 PM Yoxheimer, Reeves Forth, AGNP [facility]     Per Provider Note:   - ContinueTrazodone to 250mg  nightly for refractory insomnia   Standing order protocol requirements met?: Yes    Sent to: Pharmacy per protocal    Days Supply Given: TBD by provider  Number of Refills: TBD by provider

## 2022-08-09 MED ORDER — TRAZODONE 100 MG TABLET
ORAL_TABLET | Freq: Every evening | ORAL | 0 refills | 100 days | Status: CP
Start: 2022-08-09 — End: ?
  Filled 2022-08-10: qty 225, 90d supply, fill #0

## 2022-08-17 DIAGNOSIS — J449 Chronic obstructive pulmonary disease, unspecified: Principal | ICD-10-CM

## 2022-08-17 MED ORDER — ALBUTEROL SULFATE HFA 90 MCG/ACTUATION AEROSOL INHALER
Freq: Four times a day (QID) | RESPIRATORY_TRACT | 0 refills | 50 days | Status: CP | PRN
Start: 2022-08-17 — End: ?
  Filled 2022-08-19: qty 8.5, 50d supply, fill #0

## 2022-08-17 NOTE — Unmapped (Signed)
[facility] Clinical Assessment & Refill Coordination Note    Andrew Oconnell, DOB: 10/04/1956  Phone: [phone] (home)     All above HIPAA information was verified with patient.     Was a Nurse, learning disability used for this call? No    Specialty Medication(s):   Infectious Disease: Descovy and Tivicay     Current Outpatient Medications   Medication Sig Dispense Refill    albuterol HFA 90 mcg/actuation inhaler Inhale 1 puff every six (6) hours as needed for wheezing or shortness of breath. 8.5 g 0    ARIPiprazole (ABILIFY) 5 MG tablet Take 1 tablet (5 mg total) by mouth daily. 90 tablet 0    atorvastatin (LIPITOR) 10 MG tablet TAKE 1 TABLET BY MOUTH ONCE DAILY FOR HIGH CHOLESTEROL 90 tablet 2    dolutegravir (TIVICAY) 50 mg TABLET Take 1 tablet (50 mg total) by mouth daily. 30 tablet 11    emtricitabine-tenofovir alafen (DESCOVY) 200-25 mg tablet Take 1 tablet by mouth daily. 30 tablet 11    fluticasone (FLONASE) 50 mcg/actuation nasal spray 1 spray by Each Nare route daily. 16 g 0    ipratropium (ATROVENT) 42 mcg (0.06 %) nasal spray Use 2 sprays into each nostril four (4) times a day. 15 mL 2    lamoTRIgine (LAMICTAL) 200 MG tablet Take 1 tablet (200 mg total) by mouth daily. 30 tablet 3    losartan (COZAAR) 25 MG tablet Take 1 tablet by mouth once a day 90 tablet 1    naproxen sodium (ALEVE) 220 MG tablet Take 1 tablet (220 mg total) by mouth daily as needed for pain.      tiotropium-olodateroL (STIOLTO RESPIMAT) 2.5-2.5 mcg/actuation Mist Inhale 2 puffs daily. 4 g 11    traZODone (DESYREL) 100 MG tablet Take 2 and 1/2  tablets (250 mg total) by mouth nightly. 250 tablet 0     No current facility-administered medications for this visit.        Changes to medications: Andrew Oconnell reports no changes at this time.    Allergies   Allergen Reactions    Pollen Extracts      Congestion      Sulfa (Sulfonamide Antibiotics) Rash       Changes to allergies: No    SPECIALTY MEDICATION ADHERENCE     Descovy 200-25 mg: approximately 7 days of medicine on hand   Tivicay 50 mg: approximately 7 days of medicine on hand       Medication Adherence    Patient reported X missed doses in the last month: 0  Specialty Medication: Descovy 200-25mg   Patient is on additional specialty medications: Yes  Additional Specialty Medications: Tivicay 50mg   Patient Reported Additional Medication X Missed Doses in the Last Month: 0  Patient is on more than two specialty medications: No  Any gaps in refill history greater than 2 weeks in the last 3 months: no  Demonstrates understanding of importance of adherence: yes  Informant: patient  Provider-estimated medication adherence level: good  Patient is at risk for Non-Adherence: No    Specialty medication(s) dose(s) confirmed: Regimen is correct and unchanged.     Are there any concerns with adherence? No    Adherence counseling provided? Not needed    CLINICAL MANAGEMENT AND INTERVENTION      Clinical Benefit Assessment:    Do you feel the medicine is effective or helping your condition? Yes    HIV ASSOCIATED LABS:     Lab Results  Component Value Date/Time    HIVRS Detected (A) 03/15/2022 11:31 AM    HIVRS Detected (A) 10/14/2021 10:09 AM    HIVRS Not Detected 07/12/2021 10:16 AM    HIVRS <40 08/24/2017 12:00 AM    HIVRS Not Detected 07/29/2014 12:50 PM    HIVRS Not Detected 02/18/2014 11:42 AM    HIVCP <20 (H) 03/15/2022 11:31 AM    HIVCP <40 (H) 10/14/2021 10:09 AM    HIVCP 258 (H) 03/02/2021 11:32 AM    HIVCP <40 copies/mL 03/25/2020 12:00 AM    HIVCP <40 11/29/2016 12:00 AM    HIVCP <40 12/01/2015 12:00 AM    RCD4 36.1 03/25/2020 12:00 AM    RCD4 31.5 06/28/2016 12:00 AM    RCD4 29.6 12/01/2015 12:00 AM    ACD4 444 (L) 03/15/2022 11:31 AM    ACD4 429 (L) 10/14/2021 10:09 AM    ACD4 288 (L) 07/12/2021 10:16 AM    ACD4 433 (A) 03/25/2020 12:00 AM    ACD4 630 06/28/2016 12:00 AM    ACD4 710 12/01/2015 12:00 AM       Clinical Benefit counseling provided? Labs from 03/15/22 show evidence of clinical benefit    Adverse Effects Assessment:    Are you experiencing any side effects? No    Are you experiencing difficulty administering your medicine? No    Quality of Life Assessment:      How many days over the past month did your HIV  keep you from your normal activities? For example, brushing your teeth or getting up in the morning. 0    Have you discussed this with your provider? Not needed    Acute Infection Status:    Acute infections noted within Epic:  No active infections  Patient reported infection: None    Therapy Appropriateness:    Is therapy appropriate and patient progressing towards therapeutic goals? Yes, therapy is appropriate and should be continued    DISEASE/MEDICATION-SPECIFIC INFORMATION      N/A    HIV: Not Applicable    PATIENT SPECIFIC NEEDS     Does the patient have any physical, cognitive, or cultural barriers? No    Is the patient high risk? No    Did the patient require a clinical intervention? No    Does the patient require physician intervention or other additional services (i.e., nutrition, smoking cessation, social work)? No    SOCIAL DETERMINANTS OF HEALTH     At the [facility], we have learned that life circumstances - like trouble affording food, housing, utilities, or transportation can affect the health of many of our patients.   That is why we wanted to ask: are you currently experiencing any life circumstances that are negatively impacting your health and/or quality of life? Patient declined to answer    Social Determinants of Health     Financial Resource Strain: Not on file   Internet Connectivity: Not on file   Food Insecurity: Not on file   Tobacco Use: High Risk (07/04/2022)    Patient History     Smoking Tobacco Use: Every Day     Smokeless Tobacco Use: Never     Passive Exposure: Not on file   Housing/Utilities: Unknown (01/21/2021)    Housing/Utilities     Within the past 12 months, have you ever stayed: outside, in a car, in a tent, in an overnight shelter, or temporarily in someone else's home (i.e. couch-surfing)?: No     Are you worried about losing your housing?:  Not on file     Within the past 12 months, have you been unable to get utilities (heat, electricity) when it was really needed?: Not on file   Alcohol Use: Not on file   Transportation Needs: Not on file   Substance Use: Not on file   Health Literacy: Low Risk  (01/21/2021)    Health Literacy     : Never   Physical Activity: Not on file   Interpersonal Safety: Not on file   Stress: Not on file   Intimate Partner Violence: Not on file   Depression: Not on file   Social Connections: Not on file       Would you be willing to receive help with any of the needs that you have identified today? Not applicable       SHIPPING     Specialty Medication(s) to be Shipped:   Infectious Disease: Descovy and Tivicay    Other medication(s) to be shipped:  Losartan 25mg   Stiolto Respimat 2.5-2.5 mcg  Albuterol HFA MDI  Lamotrigine 200mg   Atorvastatin10 mg     Changes to insurance: No    Delivery Scheduled: Yes, Expected medication delivery date: 08/19/22.     Medication will be delivered via UPS to the confirmed prescription address in [facility].    The patient will receive a drug information handout for each medication shipped and additional FDA Medication Guides as required.  Verified that patient has previously received a Conservation officer, historic buildings and a Surveyor, mining.    The patient or caregiver noted above participated in the development of this care plan and knows that they can request review of or adjustments to the care plan at any time.      All of the patient's questions and concerns have been addressed.    [name], PharmD   [facility] Specialty Pharmacist

## 2022-08-17 NOTE — Unmapped (Signed)
Medication Requested: albuterol      Future Appointments   Date Time Provider Department Center   09/20/2022 12:30 PM Yoxheimer, Nikki Dom [facility]     Per Provider Note: His PRN albuterol use is relatively stable.     Standing order protocol requirements met?: Yes    Sent to: Pharmacy per protocol    Days Supply Given: 30 days  Number of Refills: 0

## 2022-08-19 MED FILL — LAMOTRIGINE 200 MG TABLET: ORAL | 30 days supply | Qty: 30 | Fill #2

## 2022-08-19 MED FILL — STIOLTO RESPIMAT 2.5 MCG-2.5 MCG/ACTUATION SOLUTION FOR INHALATION: RESPIRATORY_TRACT | 30 days supply | Qty: 4 | Fill #5

## 2022-08-19 MED FILL — LOSARTAN 25 MG TABLET: ORAL | 90 days supply | Qty: 90 | Fill #1

## 2022-08-19 MED FILL — ATORVASTATIN 10 MG TABLET: ORAL | 90 days supply | Qty: 90 | Fill #1

## 2022-08-19 MED FILL — DESCOVY 200 MG-25 MG TABLET: ORAL | 30 days supply | Qty: 30 | Fill #5

## 2022-08-19 MED FILL — TIVICAY 50 MG TABLET: ORAL | 30 days supply | Qty: 30 | Fill #5

## 2022-08-26 ENCOUNTER — Inpatient Hospital Stay
Admission: EM | Admit: 2022-08-26 | Discharge: 2022-09-03 | DRG: 190 | Disposition: A | Discharge status: Home / Self Care | Payer: Medicare Other | Attending: Internal Medicine | Admitting: Internal Medicine

## 2022-08-26 ENCOUNTER — Other Ambulatory Visit: Payer: Self-pay

## 2022-08-26 ENCOUNTER — Encounter: Payer: Self-pay | Admitting: Emergency Medicine

## 2022-08-26 ENCOUNTER — Emergency Department: Payer: Medicare Other

## 2022-08-26 DIAGNOSIS — Z21 Asymptomatic human immunodeficiency virus [HIV] infection status: Secondary | ICD-10-CM | POA: Diagnosis present

## 2022-08-26 DIAGNOSIS — E785 Hyperlipidemia, unspecified: Secondary | ICD-10-CM | POA: Diagnosis not present

## 2022-08-26 DIAGNOSIS — Z79899 Other long term (current) drug therapy: Secondary | ICD-10-CM

## 2022-08-26 DIAGNOSIS — Z881 Allergy status to other antibiotic agents status: Secondary | ICD-10-CM

## 2022-08-26 DIAGNOSIS — R059 Cough, unspecified: Secondary | ICD-10-CM | POA: Diagnosis not present

## 2022-08-26 DIAGNOSIS — J441 Chronic obstructive pulmonary disease with (acute) exacerbation: Secondary | ICD-10-CM | POA: Diagnosis not present

## 2022-08-26 DIAGNOSIS — F319 Bipolar disorder, unspecified: Secondary | ICD-10-CM | POA: Diagnosis present

## 2022-08-26 DIAGNOSIS — I10 Essential (primary) hypertension: Secondary | ICD-10-CM | POA: Insufficient documentation

## 2022-08-26 DIAGNOSIS — Z7951 Long term (current) use of inhaled steroids: Secondary | ICD-10-CM | POA: Diagnosis not present

## 2022-08-26 DIAGNOSIS — F17210 Nicotine dependence, cigarettes, uncomplicated: Secondary | ICD-10-CM | POA: Diagnosis not present

## 2022-08-26 DIAGNOSIS — F419 Anxiety disorder, unspecified: Secondary | ICD-10-CM | POA: Diagnosis not present

## 2022-08-26 DIAGNOSIS — R079 Chest pain, unspecified: Secondary | ICD-10-CM | POA: Diagnosis not present

## 2022-08-26 DIAGNOSIS — B182 Chronic viral hepatitis C: Secondary | ICD-10-CM | POA: Diagnosis not present

## 2022-08-26 DIAGNOSIS — K769 Liver disease, unspecified: Secondary | ICD-10-CM | POA: Diagnosis not present

## 2022-08-26 DIAGNOSIS — B20 Human immunodeficiency virus [HIV] disease: Secondary | ICD-10-CM | POA: Diagnosis present

## 2022-08-26 DIAGNOSIS — E876 Hypokalemia: Secondary | ICD-10-CM | POA: Diagnosis not present

## 2022-08-26 DIAGNOSIS — J439 Emphysema, unspecified: Secondary | ICD-10-CM | POA: Diagnosis not present

## 2022-08-26 DIAGNOSIS — D539 Nutritional anemia, unspecified: Secondary | ICD-10-CM | POA: Diagnosis present

## 2022-08-26 DIAGNOSIS — J449 Chronic obstructive pulmonary disease, unspecified: Secondary | ICD-10-CM | POA: Diagnosis not present

## 2022-08-26 DIAGNOSIS — F32A Depression, unspecified: Secondary | ICD-10-CM | POA: Diagnosis present

## 2022-08-26 DIAGNOSIS — J9621 Acute and chronic respiratory failure with hypoxia: Secondary | ICD-10-CM | POA: Diagnosis present

## 2022-08-26 DIAGNOSIS — J9601 Acute respiratory failure with hypoxia: Secondary | ICD-10-CM | POA: Diagnosis present

## 2022-08-26 DIAGNOSIS — Z1152 Encounter for screening for COVID-19: Secondary | ICD-10-CM | POA: Diagnosis not present

## 2022-08-26 DIAGNOSIS — Z882 Allergy status to sulfonamides status: Secondary | ICD-10-CM

## 2022-08-26 LAB — BASIC METABOLIC PANEL
Anion gap: 7 (ref 5–15)
BUN: 10 mg/dL (ref 8–23)
CO2: 28 mmol/L (ref 22–32)
Calcium: 9.1 mg/dL (ref 8.9–10.3)
Chloride: 102 mmol/L (ref 98–111)
Creatinine, Ser: 1.09 mg/dL (ref 0.61–1.24)
GFR, Estimated: >60 mL/min (ref >60)
Glucose, Bld: 130 mg/dL — ABNORMAL HIGH (ref 70–99)
Potassium: 3.2 mmol/L — ABNORMAL LOW (ref 3.5–5.1)
Sodium: 137 mmol/L (ref 135–145)

## 2022-08-26 LAB — CBC
HCT: 34.7 % — ABNORMAL LOW (ref 39.0–52.0)
Hemoglobin: 11.7 g/dL — ABNORMAL LOW (ref 13.0–17.0)
MCH: 34.3 pg — ABNORMAL HIGH (ref 26.0–34.0)
MCHC: 33.7 g/dL (ref 30.0–36.0)
MCV: 101.8 fL — ABNORMAL HIGH (ref 80.0–100.0)
Platelets: 115 10*3/uL — ABNORMAL LOW (ref 150–400)
RBC: 3.41 MIL/uL — ABNORMAL LOW (ref 4.22–5.81)
RDW: 12.6 % (ref 11.5–15.5)
WBC: 3.6 10*3/uL — ABNORMAL LOW (ref 4.0–10.5)
nRBC: 0 % (ref 0.0–0.2)

## 2022-08-26 LAB — MAGNESIUM: Magnesium: 5 mg/dL — ABNORMAL HIGH (ref 1.7–2.4)

## 2022-08-26 LAB — RESP PANEL BY RT-PCR (FLU A&B, COVID) ARPGX2
Influenza A by PCR: NEGATIVE
Influenza B by PCR: NEGATIVE
SARS Coronavirus 2 by RT PCR: NEGATIVE

## 2022-08-26 LAB — TROPONIN I (HIGH SENSITIVITY): Troponin I (High Sensitivity): 5 ng/L (ref <18)

## 2022-08-26 LAB — BRAIN NATRIURETIC PEPTIDE: B Natriuretic Peptide: 62.7 pg/mL (ref 0.0–100.0)

## 2022-08-26 MED ORDER — LOSARTAN POTASSIUM 25 MG PO TABS
25.0000 mg | ORAL_TABLET | Freq: Every day | ORAL | Status: DC
  Administered 2022-08-27 – 2022-09-03 (×8): 25 mg via ORAL
  Filled 2022-08-26 (×8): qty 1

## 2022-08-26 MED ORDER — IPRATROPIUM-ALBUTEROL 0.5-2.5 (3) MG/3ML IN SOLN
3.0000 mL | Freq: Once | RESPIRATORY_TRACT | Status: AC
  Administered 2022-08-26: 3 mL via RESPIRATORY_TRACT
  Filled 2022-08-26: qty 6

## 2022-08-26 MED ORDER — ACETAMINOPHEN 325 MG PO TABS
650.0000 mg | ORAL_TABLET | Freq: Four times a day (QID) | ORAL | Status: DC | PRN
  Administered 2022-08-30: 650 mg via ORAL
  Filled 2022-08-26: qty 2

## 2022-08-26 MED ORDER — METHYLPREDNISOLONE SODIUM SUCC 125 MG IJ SOLR
80.0000 mg | INTRAMUSCULAR | Status: AC
  Administered 2022-08-26: 80 mg via INTRAVENOUS
  Filled 2022-08-26: qty 2

## 2022-08-26 MED ORDER — ONDANSETRON HCL 4 MG/2ML IJ SOLN: 4.0000 mg | Freq: Four times a day (QID) | INTRAMUSCULAR | Status: DC | PRN

## 2022-08-26 MED ORDER — TRAZODONE HCL 50 MG PO TABS
100.0000 mg | ORAL_TABLET | Freq: Every day | ORAL | Status: DC
  Administered 2022-08-26 – 2022-09-02 (×8): 100 mg via ORAL
  Filled 2022-08-26 (×8): qty 2

## 2022-08-26 MED ORDER — POTASSIUM CHLORIDE CRYS ER 20 MEQ PO TBCR
40.0000 meq | EXTENDED_RELEASE_TABLET | Freq: Once | ORAL | Status: AC
  Administered 2022-08-26: 40 meq via ORAL
  Filled 2022-08-26: qty 2

## 2022-08-26 MED ORDER — SODIUM CHLORIDE 0.9 % IV SOLN
1.0000 g | INTRAVENOUS | Status: DC
  Administered 2022-08-26: 1 g via INTRAVENOUS
  Filled 2022-08-26 (×2): qty 10

## 2022-08-26 MED ORDER — DOLUTEGRAVIR SODIUM 50 MG PO TABS
50.0000 mg | ORAL_TABLET | Freq: Every day | ORAL | Status: DC
  Administered 2022-08-27 – 2022-09-03 (×8): 50 mg via ORAL
  Filled 2022-08-26 (×8): qty 1

## 2022-08-26 MED ORDER — ENOXAPARIN SODIUM 40 MG/0.4ML IJ SOSY
40.0000 mg | PREFILLED_SYRINGE | INTRAMUSCULAR | Status: DC
  Administered 2022-08-26 – 2022-09-02 (×8): 40 mg via SUBCUTANEOUS
  Filled 2022-08-26 (×8): qty 0.4

## 2022-08-26 MED ORDER — ONDANSETRON HCL 4 MG PO TABS: 4.0000 mg | ORAL_TABLET | Freq: Four times a day (QID) | ORAL | Status: DC | PRN

## 2022-08-26 MED ORDER — MAGNESIUM SULFATE 2 GM/50ML IV SOLN
2.0000 g | Freq: Once | INTRAVENOUS | Status: AC
  Administered 2022-08-26: 2 g via INTRAVENOUS
  Filled 2022-08-26: qty 50

## 2022-08-26 MED ORDER — IPRATROPIUM-ALBUTEROL 0.5-2.5 (3) MG/3ML IN SOLN
3.0000 mL | Freq: Four times a day (QID) | RESPIRATORY_TRACT | Status: DC
  Administered 2022-08-26 – 2022-08-29 (×14): 3 mL via RESPIRATORY_TRACT
  Filled 2022-08-26 (×14): qty 3

## 2022-08-26 MED ORDER — SODIUM CHLORIDE 0.9 % IV SOLN: INTRAVENOUS | Status: AC

## 2022-08-26 MED ORDER — IPRATROPIUM-ALBUTEROL 0.5-2.5 (3) MG/3ML IN SOLN
3.0000 mL | Freq: Once | RESPIRATORY_TRACT | Status: AC
  Administered 2022-08-26: 3 mL via RESPIRATORY_TRACT

## 2022-08-26 MED ORDER — ATORVASTATIN CALCIUM 20 MG PO TABS
10.0000 mg | ORAL_TABLET | Freq: Every day | ORAL | Status: DC
  Administered 2022-08-26 – 2022-09-02 (×8): 10 mg via ORAL
  Filled 2022-08-26 (×8): qty 1

## 2022-08-26 MED ORDER — PREDNISONE 20 MG PO TABS
40.0000 mg | ORAL_TABLET | Freq: Every day | ORAL | Status: DC
  Administered 2022-08-27: 40 mg via ORAL
  Filled 2022-08-26: qty 2

## 2022-08-26 MED ORDER — EMTRICITABINE-TENOFOVIR AF 200-25 MG PO TABS
1.0000 | ORAL_TABLET | Freq: Every day | ORAL | Status: DC
  Administered 2022-08-27 – 2022-09-03 (×8): 1 via ORAL
  Filled 2022-08-26 (×8): qty 1

## 2022-08-26 MED ORDER — ARIPIPRAZOLE 5 MG PO TABS
5.0000 mg | ORAL_TABLET | Freq: Every day | ORAL | Status: DC
  Administered 2022-08-27 – 2022-09-03 (×8): 5 mg via ORAL
  Filled 2022-08-26 (×8): qty 1

## 2022-08-26 MED ORDER — MOMETASONE FURO-FORMOTEROL FUM 100-5 MCG/ACT IN AERO
2.0000 | INHALATION_SPRAY | Freq: Two times a day (BID) | RESPIRATORY_TRACT | Status: DC
  Administered 2022-08-27 – 2022-08-30 (×8): 2 via RESPIRATORY_TRACT
  Filled 2022-08-26: qty 8.8

## 2022-08-26 MED ORDER — ALBUTEROL SULFATE (2.5 MG/3ML) 0.083% IN NEBU
2.5000 mg | INHALATION_SOLUTION | RESPIRATORY_TRACT | Status: DC | PRN
  Filled 2022-08-26: qty 3

## 2022-08-26 MED ORDER — IPRATROPIUM-ALBUTEROL 0.5-2.5 (3) MG/3ML IN SOLN
3.0000 mL | Freq: Once | RESPIRATORY_TRACT | Status: AC
  Administered 2022-08-26: 3 mL via RESPIRATORY_TRACT
  Filled 2022-08-26: qty 3

## 2022-08-26 MED ORDER — LAMOTRIGINE 100 MG PO TABS
200.0000 mg | ORAL_TABLET | Freq: Every day | ORAL | Status: DC
  Administered 2022-08-27 – 2022-09-03 (×8): 200 mg via ORAL
  Filled 2022-08-26 (×8): qty 2

## 2022-08-26 MED ORDER — ACETAMINOPHEN 650 MG RE SUPP: 650.0000 mg | Freq: Four times a day (QID) | RECTAL | Status: DC | PRN

## 2022-08-26 MED ORDER — METHYLPREDNISOLONE SODIUM SUCC 125 MG IJ SOLR
125.0000 mg | Freq: Once | INTRAMUSCULAR | Status: AC
  Administered 2022-08-26: 125 mg via INTRAVENOUS
  Filled 2022-08-26: qty 2

## 2022-08-26 NOTE — ED Triage Notes (Signed)
Pt in via POV, reports ongoing congestion, cough, shortness of breath w/ chest pain x approximately 4 days.  Pt reports hx of COPD, and Emphysema.  NAD noted at this time.

## 2022-08-26 NOTE — Assessment & Plan Note (Signed)
Continue Dolutegravir

## 2022-08-26 NOTE — ED Provider Notes (Signed)
[facility] Provider Note    Event Date/Time   First MD Initiated Contact with Patient 08/26/22 1205     (approximate)   History  Shortness of breath   HPI  Gregory Robinson is a 66 y.o. male with a history of COPD, HIV, liver disease who presents with complaints of shortness of breath.  He reports over the last several days his breathing has become worse.  He reports family had viral symptom and now he has had a productive cough which she thinks has precipitated his shortness of breath.  No fevers reported.  No significant lower extremity swelling.  Review of medical record demonstrates the patient sees Dr. Sallyanne Kuster of pulmonology at [facility], last visit September 18     Physical Exam   Triage Vital Signs: ED Triage Vitals  Enc Vitals Group     BP 08/26/22 1210 (!) 163/71     Pulse Rate 08/26/22 1210 97     Resp 08/26/22 1210 16     Temp 08/26/22 1210 99.1 F (37.3 C)     Temp Source 08/26/22 1210 Oral     SpO2 08/26/22 1209 93 %     Weight 08/26/22 1211 97.5 kg (215 lb)     Height 08/26/22 1211 1.956 m (6\' 5" )     Head Circumference --      Peak Flow --      Pain Score 08/26/22 1211 0     Pain Loc --      Pain Edu? --      Excl. in GC? --     Most recent vital signs: Vitals:   08/26/22 1330 08/26/22 1400  BP: 96/64 115/67  Pulse: 83 91  Resp: 16 20  Temp:    SpO2: 91% 93%     General: Awake, no distress.  CV:  Good peripheral perfusion.  Resp:  Mild tachypnea, diffuse wheezing Abd:  No distention.  Other:  No lower extremity tenderness palpation   ED Results / Procedures / Treatments   Labs (all labs ordered are listed, but only abnormal results are displayed) Labs Reviewed  CBC - Abnormal; Notable for the following components:      Result Value   WBC 3.6 (*)    RBC 3.41 (*)    Hemoglobin 11.7 (*)    HCT 34.7 (*)    MCV 101.8 (*)    MCH 34.3 (*)    Platelets 115 (*)    All other components within normal limits  BASIC METABOLIC  PANEL - Abnormal; Notable for the following components:   Potassium 3.2 (*)    Glucose, Bld 130 (*)    All other components within normal limits  RESP PANEL BY RT-PCR (FLU A&B, COVID) [id number]  BRAIN NATRIURETIC PEPTIDE  TROPONIN I (HIGH SENSITIVITY)     EKG  ED ECG REPORT I, 13/10/23, the attending physician, personally viewed and interpreted this ECG.  Date: 08/26/2022  Rhythm: normal sinus rhythm QRS Axis: normal Intervals: Abnormal ST/T Wave abnormalities: Nonspecific ranges Narrative Interpretation: no evidence of acute ischemia    RADIOLOGY Chest x-ray viewed interpreted by me, no evidence of pneumonia    PROCEDURES:  Critical Care performed: yes  CRITICAL CARE Performed by: 13/07/2022   Total critical care time: 30 minutes  Critical care time was exclusive of separately billable procedures and treating other patients.  Critical care was necessary to treat or prevent imminent or life-threatening deterioration.  Critical care was time spent personally by me  on the following activities: development of treatment plan with patient and/or surrogate as well as nursing, discussions with consultants, evaluation of patient's response to treatment, examination of patient, obtaining history from patient or surrogate, ordering and performing treatments and interventions, ordering and review of laboratory studies, ordering and review of radiographic studies, pulse oximetry and re-evaluation of patient's condition.   Procedures   MEDICATIONS ORDERED IN ED: Medications  magnesium sulfate IVPB 2 g 50 mL (2 g Intravenous New Bag/Given 08/26/22 1350)  ipratropium-albuterol (DUONEB) 0.5-2.5 (3) MG/3ML nebulizer solution 3 mL (3 mLs Nebulization Given 08/26/22 1240)  ipratropium-albuterol (DUONEB) 0.5-2.5 (3) MG/3ML nebulizer solution 3 mL (3 mLs Nebulization Given 08/26/22 1246)  ipratropium-albuterol (DUONEB) 0.5-2.5 (3) MG/3ML nebulizer solution 3 mL (3 mLs  Nebulization Given 08/26/22 1347)  methylPREDNISolone sodium succinate (SOLU-MEDROL) 125 mg/2 mL injection 125 mg (125 mg Intravenous Given 08/26/22 1347)     IMPRESSION / MDM / ASSESSMENT AND PLAN / ED COURSE  I reviewed the triage vital signs and the nursing notes. Patient's presentation is most consistent with severe exacerbation of chronic illness.  Patient presents with shortness of breath as detailed above.  Oxygen saturations between 88 and 90 while lying in bed.  Started on nasal cannula oxygen.  Significant wheezing  Differential includes COPD exacerbation, pneumonia, less likely CHF  Patient treated with multiple DuoNebs with some improvement, IV Solu-Medrol IV magnesium added  Ambulatory pulse ox demonstrates room air saturation of 85% after treatment.  Lab work reviewed  I consulted the hospitalist for admission        FINAL CLINICAL IMPRESSION(S) / ED DIAGNOSES   Final diagnoses:  COPD exacerbation (HCC)  Acute on chronic respiratory failure with hypoxia (HCC)     Rx / DC Orders   ED Discharge Orders     None        Note:  This document was prepared using Dragon voice recognition software and may include unintentional dictation errors.   Jene Every, MD 08/26/22 1432

## 2022-08-26 NOTE — Assessment & Plan Note (Signed)
Supplement potassium Check magnesium level 

## 2022-08-26 NOTE — ED Notes (Signed)
Walking pulse ox without O2 85%. MD aware

## 2022-08-26 NOTE — H&P (Signed)
History and Physical    Patient: Gregory Robinson ZOX:[id number] DOB: [birth year] DOA: 08/26/2022 DOS: the patient was seen and examined on 08/26/2022 PCP: Patient, No Pcp Per  Patient coming from: Home  Chief Complaint:  Chief Complaint  Patient presents with   Chest Pain   Shortness of Breath   HPI: Gregory Robinson is a 66 y.o. male with medical history significant for COPD, depression, HIV on Edwyna Shell, chronic hepatitis C, bipolar disorder who presents to the ER for evaluation of a 5-day history of worsening shortness of breath from his baseline associated with cough productive of yellow phlegm and wheezing.  He denies having any sick contacts and has had a low-grade fever. He also has nasal congestion. He denies having any chest pain, no sore throat, no orthopnea, no leg swelling, no headache, no dizziness, no lightheadedness, no abdominal pain, no changes in his bowel habits, no urinary symptoms, no blurred vision no focal deficit. His respiratory viral panel is negative Patient was noted to have diffuse wheezes upon presentation to the ER and received a dose of Solu-Medrol as well as multiple bronchodilator therapy without any improvement in his symptoms. He was ambulated in the ER and his post ambulatory pulse ox was 85%.  He is currently on oxygen at 2 L and will be admitted to the hospital for further evaluation.     Review of Systems: As mentioned in the history of present illness. All other systems reviewed and are negative. Past Medical History:  Diagnosis Date   Bipolar affective disorder (HCC)    Chronic hepatitis C (HCC)    COPD (chronic obstructive pulmonary disease) (HCC)    Depression    HIV (human immunodeficiency virus infection) (HCC)    Hyperlipidemia    Liver disease    Past Surgical History:  Procedure Laterality Date   BACK SURGERY     Social History:  reports that he has been smoking. He has a 30.00 pack-year smoking history. He has never used smokeless tobacco. He  reports current alcohol use of about 2.0 standard drinks of alcohol per week. He reports that he does not use drugs.  Allergies  Allergen Reactions   Sulfa Antibiotics Rash    Unknown     Family History  Adopted: Yes    Prior to Admission medications   Medication Sig Start Date End Date Taking? Authorizing Provider  ARIPiprazole (ABILIFY) 5 MG tablet Take 1 tablet by mouth daily. 07/19/22  Yes [provider]  atorvastatin (LIPITOR) 10 MG tablet Take by mouth. 05/24/22  Yes [provider]  DESCOVY 200-25 MG tablet Take 1 tablet by mouth daily. 11/29/16  Yes [provider]  lamoTRIgine (LAMICTAL) 200 MG tablet Take 1 tablet by mouth daily. 06/17/22  Yes [provider]  losartan (COZAAR) 25 MG tablet Take 1 tablet by mouth daily. 05/24/22  Yes [provider]  STIOLTO RESPIMAT 2.5-2.5 MCG/ACT AERS Inhale into the lungs. 03/12/22  Yes [provider]  TIVICAY 50 MG tablet Take 1 tablet by mouth daily. 11/29/16  Yes [provider]  traZODone (DESYREL) 100 MG tablet Take by mouth. 08/09/22  Yes [provider]  albuterol (VENTOLIN HFA) 108 (90 Base) MCG/ACT inhaler Inhale 2 puffs into the lungs every 4 (four) hours as needed for shortness of breath or wheezing.    [provider]  azithromycin (ZITHROMAX Z-PAK) 250 MG tablet Take 2 tablets (500 mg) on  Day 1,  followed by 1 tablet (250 mg) once daily on Days  2 through 5. Patient not taking: Reported on 08/26/2022 10/13/19   Enid Derry, PA-C  ipratropium (ATROVENT) 0.06 % nasal spray Place 2 sprays into both nostrils 3 (three) times daily. Patient not taking: Reported on 08/26/2022 12/09/21 12/09/22  [provider]  mometasone-formoterol (DULERA) 100-5 MCG/ACT AERO Inhale 2 puffs into the lungs 2 (two) times daily. Patient not taking: Reported on 08/26/2022 01/12/16   Hower, Cletis [place], MD  predniSONE (DELTASONE) 10 MG tablet Take 6 tablets on day 1, take 5  tablets on day 2, take 4 tablets on day 3, take 3 tablets on day 4, take 2 tablets on day 5, take 1 tablet on day 6 Patient not taking: Reported on 08/26/2022 10/13/19   Enid Derry, PA-C  tiotropium (SPIRIVA) 18 MCG inhalation capsule Place 1 capsule (18 mcg total) into inhaler and inhale daily. Patient not taking: Reported on 08/26/2022 01/12/16   Wyatt Haste, MD    Physical Exam: Vitals:   08/26/22 1211 08/26/22 1300 08/26/22 1330 08/26/22 1400  BP:  113/60 96/64 115/67  Pulse:  83 83 91  Resp:  12 16 20   Temp:      TempSrc:      SpO2:  91% 91% 93%  Weight: 97.5 kg     Height: 6\' 5"  (1.956 m)      Physical Exam Vitals and nursing note reviewed.  Constitutional:      Appearance: He is well-developed.     Comments: Chronically ill-appearing  HENT:     Head: Normocephalic and atraumatic.  Eyes:     Comments: Pale conjunctiva  Cardiovascular:     Rate and Rhythm: Normal rate and regular rhythm.     Heart sounds: Normal heart sounds.  Pulmonary:     Effort: Pulmonary effort is normal.     Breath sounds: Examination of the right-upper field reveals wheezing. Examination of the left-upper field reveals wheezing. Examination of the right-middle field reveals wheezing. Examination of the left-middle field reveals wheezing. Examination of the right-lower field reveals wheezing. Examination of the left-lower field reveals wheezing. Wheezing present.  Abdominal:     General: Bowel sounds are normal.     Palpations: Abdomen is soft.  Musculoskeletal:        General: Normal range of motion.     Cervical back: Normal range of motion and neck supple.  Skin:    General: Skin is warm and dry.  Neurological:     General: No focal deficit present.     Mental Status: He is alert.  Psychiatric:        Mood and Affect: Mood normal.        Behavior: Behavior normal.     Data Reviewed: Relevant notes from primary care and specialist visits, past discharge summaries as available in  EHR, including Care Everywhere. Prior diagnostic testing as pertinent to current admission diagnoses Updated medications and problem lists for reconciliation ED course, including vitals, labs, imaging, treatment and response to treatment Triage notes, nursing and pharmacy notes and ED provider's notes Notable results as noted in HPI Labs reviewed.  BNP 62.7, sodium 137, potassium 3.2, chloride 102, bicarb 28, glucose 130, BUN 10, creatinine 1.09, calcium 9.1, troponin 5, Sokolow count 3.6, hemoglobin 11.7, hematocrit 34.7, platelet count 115 Respiratory viral panel is negative Chest x-ray reviewed by me shows hyperinflated lung fields Twelve-lead EKG reviewed by me shows sinus rhythm There are no new results to review at this time.  Assessment and Plan: * COPD with acute exacerbation (  HCC) Patient has a history of COPD and presents to the ER for evaluation of worsening shortness of breath associated with cough productive of yellow phlegm and wheezing We will place patient on Rocephin 1 g IV daily Place patient on inhaled and systemic steroids Place patient on scheduled and as needed bronchodilator therapy Continue oxygen supplementation at 2 L to maintain pulse oximetry greater than 92% Assess for home oxygen need prior to discharge  Acute hypoxic respiratory failure (HCC) Secondary to acute COPD exacerbation Patient had post ambulatory pulse ox of 85% and is currently on 2 L of oxygen We will attempt to wean patient off oxygen as tolerated and he will need to be assessed for home oxygen prior to discharge.  Hypokalemia Supplement potassium Check magnesium level  Essential hypertension Continue Cozaar  Depression Continue trazodone, Descovy and Abilify  HIV (human immunodeficiency virus infection) (HCC) Continue Dolutegravir      Advance Care Planning:   Code Status: Full Code   Consults: None  Family Communication: Greater than 50% of time was spent discussing patient's  condition and plan of care with him at the bedside.  All questions and concerns have been addressed.  He verbalizes understanding and agrees with the plan.  Severity of Illness: The appropriate patient status for this patient is INPATIENT. Inpatient status is judged to be reasonable and necessary in order to provide the required intensity of service to ensure the patient's safety. The patient's presenting symptoms, physical exam findings, and initial radiographic and laboratory data in the context of their chronic comorbidities is felt to place them at high risk for further clinical deterioration. Furthermore, it is not anticipated that the patient will be medically stable for discharge from the hospital within 2 midnights of admission.   * I certify that at the point of admission it is my clinical judgment that the patient will require inpatient hospital care spanning beyond 2 midnights from the point of admission due to high intensity of service, high risk for further deterioration and high frequency of surveillance required.*  Author: Lucile Shutters, MD 08/26/2022 3:29 PM  For on call review [email or web address].

## 2022-08-26 NOTE — Assessment & Plan Note (Signed)
Secondary to acute COPD exacerbation Patient had post ambulatory pulse ox of 85% and is currently on 2 L of oxygen We will attempt to wean patient off oxygen as tolerated and he will need to be assessed for home oxygen prior to discharge.

## 2022-08-26 NOTE — Assessment & Plan Note (Signed)
Continue Cozaar 

## 2022-08-26 NOTE — Assessment & Plan Note (Addendum)
Continue trazodone, Descovy and Abilify

## 2022-08-26 NOTE — Assessment & Plan Note (Signed)
Patient has a history of COPD and presents to the ER for evaluation of worsening shortness of breath associated with cough productive of yellow phlegm and wheezing We will place patient on Rocephin 1 g IV daily Place patient on inhaled and systemic steroids Place patient on scheduled and as needed bronchodilator therapy Continue oxygen supplementation at 2 L to maintain pulse oximetry greater than 92% Assess for home oxygen need prior to discharge

## 2022-08-27 DIAGNOSIS — J441 Chronic obstructive pulmonary disease with (acute) exacerbation: Secondary | ICD-10-CM | POA: Diagnosis not present

## 2022-08-27 LAB — CBC
HCT: 33.1 % — ABNORMAL LOW (ref 39.0–52.0)
Hemoglobin: 11.1 g/dL — ABNORMAL LOW (ref 13.0–17.0)
MCH: 34 pg (ref 26.0–34.0)
MCHC: 33.5 g/dL (ref 30.0–36.0)
MCV: 101.5 fL — ABNORMAL HIGH (ref 80.0–100.0)
Platelets: 108 10*3/uL — ABNORMAL LOW (ref 150–400)
RBC: 3.26 MIL/uL — ABNORMAL LOW (ref 4.22–5.81)
RDW: 12.2 % (ref 11.5–15.5)
WBC: 2.2 10*3/uL — ABNORMAL LOW (ref 4.0–10.5)
nRBC: 0 % (ref 0.0–0.2)

## 2022-08-27 LAB — BASIC METABOLIC PANEL
Anion gap: 4 — ABNORMAL LOW (ref 5–15)
BUN: 12 mg/dL (ref 8–23)
CO2: 26 mmol/L (ref 22–32)
Calcium: 8.9 mg/dL (ref 8.9–10.3)
Chloride: 108 mmol/L (ref 98–111)
Creatinine, Ser: 1 mg/dL (ref 0.61–1.24)
GFR, Estimated: >60 mL/min (ref >60)
Glucose, Bld: 162 mg/dL — ABNORMAL HIGH (ref 70–99)
Potassium: 4.1 mmol/L (ref 3.5–5.1)
Sodium: 138 mmol/L (ref 135–145)

## 2022-08-27 LAB — PROCALCITONIN: Procalcitonin: <0.1 ng/mL

## 2022-08-27 MED ORDER — METHYLPREDNISOLONE SODIUM SUCC 40 MG IJ SOLR
40.0000 mg | Freq: Three times a day (TID) | INTRAMUSCULAR | Status: DC
  Administered 2022-08-27 – 2022-08-29 (×6): 40 mg via INTRAVENOUS
  Filled 2022-08-27 (×6): qty 1

## 2022-08-27 MED ORDER — GUAIFENESIN 100 MG/5ML PO LIQD
5.0000 mL | ORAL | Status: DC | PRN
  Administered 2022-08-27 – 2022-09-02 (×8): 5 mL via ORAL
  Filled 2022-08-27 (×8): qty 10

## 2022-08-27 MED ORDER — STERILE WATER FOR INJECTION IJ SOLN
INTRAMUSCULAR | Status: AC
  Filled 2022-08-27: qty 10

## 2022-08-27 MED ORDER — SENNOSIDES-DOCUSATE SODIUM 8.6-50 MG PO TABS: 1.0000 | ORAL_TABLET | Freq: Every evening | ORAL | Status: DC | PRN

## 2022-08-27 MED ORDER — HYDRALAZINE HCL 20 MG/ML IJ SOLN: 10.0000 mg | INTRAMUSCULAR | Status: DC | PRN

## 2022-08-27 NOTE — Progress Notes (Signed)
PROGRESS NOTE    Gregory Robinson  YTK:[id number] DOB: 1955/11/25 DOA: 08/26/2022 PCP: Patient, No Pcp Per   Brief Narrative:  66 y.o. male with medical history significant for COPD, depression, HIV on [place], chronic hepatitis C, bipolar disorder who presents to the ER for evaluation of a 5-day history of worsening shortness of breath from his baseline associated with cough productive of yellow phlegm and wheezing.  Patient admitted, Respiratory panel, chest x-ray showed hyperinflated lungs.  Patient was started on Solu-Medrol, Rocephin bronchodilators   Assessment & Plan:  Principal Problem:   COPD with acute exacerbation (HCC) Active Problems:   Acute hypoxic respiratory failure (HCC)   HIV (human immunodeficiency virus infection) (HCC)   Depression   Essential hypertension   Hypokalemia     Assessment and Plan: * COPD with acute exacerbation (HCC) Acute  respiratory failure with hypoxia Currently patient is on steroids, bronchodilators scheduled and as needed, I-S/flutter valve.  Procalcitonin is negative therefore will stop antibiotics  Acute hypoxic respiratory failure (HCC) Does not use any oxygen at home, on room air patient was 85% on 2 L nasal cannula  Hypokalemia Resolved  Essential hypertension Continue Cozaar  Depression Continue trazodone, Descovy and Abilify  HIV (human immunodeficiency virus infection) (HCC) Continue Dolutegravir   Macrocytic anemia - no Signs of bleeding; monitor       DVT prophylaxis: Lovenox Code Status: Full code Family Communication: Spoke with patient's sister  Status is: Inpatient Significant breath sounds.  Maintain hospital stay for at least next 24 hours  Subjective: Seen and examined at bedside, still has quite a bit of exertional dyspnea   Examination:  General exam: Appears calm and comfortable  Respiratory system: Diffuse bilateral rhonchi Cardiovascular system: S1 & S2 heard, RRR. No JVD, murmurs, rubs, gallops  or clicks. No pedal edema. Gastrointestinal system: Abdomen is nondistended, soft and nontender. No organomegaly or masses felt. Normal bowel sounds heard. Central nervous system: Alert and oriented. No focal neurological deficits. Extremities: Symmetric 5 x 5 power. Skin: No rashes, lesions or ulcers Psychiatry: Judgement and insight appear normal. Mood & affect appropriate.     Objective: Vitals:   08/26/22 1550 08/26/22 2020 08/27/22 0054 08/27/22 0421  BP: (!) 144/68 (!) 111/53 127/66 (!) 117/57  Pulse: 99 89 79 78  Resp: 16 18 18 20   Temp: 98.4 F (36.9 C) 98.2 F (36.8 C) 98.2 F (36.8 C) 98 F (36.7 C)  TempSrc:  Oral Oral Oral  SpO2: 90% 94% 90% 94%  Weight:      Height:        Intake/Output Summary (Last 24 hours) at 08/27/2022 0743 Last data filed at 08/26/2022 1623 Gross per 24 hour  Intake 108.63 ml  Output --  Net 108.63 ml   Filed Weights   08/26/22 1211  Weight: 97.5 kg     Data Reviewed:   CBC: Recent Labs  Lab 08/26/22 1346 08/27/22 0517  WBC 3.6* 2.2*  HGB 11.7* 11.1*  HCT 34.7* 33.1*  MCV 101.8* 101.5*  PLT 115* 108*   Basic Metabolic Panel: Recent Labs  Lab 08/26/22 1346 08/26/22 1501 08/27/22 0517  NA 137  --  138  K 3.2*  --  4.1  CL 102  --  108  CO2 28  --  26  GLUCOSE 130*  --  162*  BUN 10  --  12  CREATININE 1.09  --  1.00  CALCIUM 9.1  --  8.9  MG  --  5.0*  --  GFR: Estimated Creatinine Clearance: 89.1 mL/min (by C-G formula based on SCr of 1 mg/dL). Liver Function Tests: No results for input(s): "AST", "ALT", "ALKPHOS", "BILITOT", "PROT", "ALBUMIN" in the last 168 hours. No results for input(s): "LIPASE", "AMYLASE" in the last 168 hours. No results for input(s): "AMMONIA" in the last 168 hours. Coagulation Profile: No results for input(s): "INR", "PROTIME" in the last 168 hours. Cardiac Enzymes: No results for input(s): "CKTOTAL", "CKMB", "CKMBINDEX", "TROPONINI" in the last 168 hours. BNP (last 3  results) No results for input(s): "PROBNP" in the last 8760 hours. HbA1C: No results for input(s): "HGBA1C" in the last 72 hours. CBG: No results for input(s): "GLUCAP" in the last 168 hours. Lipid Profile: No results for input(s): "CHOL", "HDL", "LDLCALC", "TRIG", "CHOLHDL", "LDLDIRECT" in the last 72 hours. Thyroid Function Tests: No results for input(s): "TSH", "T4TOTAL", "FREET4", "T3FREE", "THYROIDAB" in the last 72 hours. Anemia Panel: No results for input(s): "VITAMINB12", "FOLATE", "FERRITIN", "TIBC", "IRON", "RETICCTPCT" in the last 72 hours. Sepsis Labs: No results for input(s): "PROCALCITON", "LATICACIDVEN" in the last 168 hours.  Recent Results (from the past 240 hour(s))  Resp Panel by RT-PCR (Flu A&B, Covid) Anterior Nasal Swab     Status: None   Collection Time: 08/26/22 12:16 PM   Specimen: Anterior Nasal Swab  Result Value Ref Range Status   SARS Coronavirus 2 by RT PCR NEGATIVE NEGATIVE Final    Comment: (NOTE) SARS-CoV-2 target nucleic acids are NOT DETECTED.  The SARS-CoV-2 RNA is generally detectable in upper respiratory specimens during the acute phase of infection. The lowest concentration of SARS-CoV-2 viral copies this assay can detect is 138 copies/mL. A negative result does not preclude SARS-Cov-2 infection and should not be used as the sole basis for treatment or other patient management decisions. A negative result may occur with  improper specimen collection/handling, submission of specimen other than nasopharyngeal swab, presence of viral mutation(s) within the areas targeted by this assay, and inadequate number of viral copies(<138 copies/mL). A negative result must be combined with clinical observations, patient history, and epidemiological information. The expected result is Negative.  Fact Sheet for Patients:  BloggerCourse.com  Fact Sheet for Healthcare Providers:  SeriousBroker.it  This  test is no t yet approved or cleared by the [place] FDA and  has been authorized for detection and/or diagnosis of SARS-CoV-2 by FDA under an Emergency Use Authorization (EUA). This EUA will remain  in effect (meaning this test can be used) for the duration of the COVID-19 declaration under Section 564(b)(1) of the Act, 21 U.S.C.section 360bbb-3(b)(1), unless the authorization is terminated  or revoked sooner.       Influenza A by PCR NEGATIVE NEGATIVE Final   Influenza B by PCR NEGATIVE NEGATIVE Final    Comment: (NOTE) The Xpert Xpress SARS-CoV-2/FLU/RSV plus assay is intended as an aid in the diagnosis of influenza from Nasopharyngeal swab specimens and should not be used as a sole basis for treatment. Nasal washings and aspirates are unacceptable for Xpert Xpress SARS-CoV-2/FLU/RSV testing.  Fact Sheet for Patients: BloggerCourse.com  Fact Sheet for Healthcare Providers: SeriousBroker.it  This test is not yet approved or cleared by the [place] FDA and has been authorized for detection and/or diagnosis of SARS-CoV-2 by FDA under an Emergency Use Authorization (EUA). This EUA will remain in effect (meaning this test can be used) for the duration of the COVID-19 declaration under Section 564(b)(1) of the Act, 21 U.S.C. section 360bbb-3(b)(1), unless the authorization is terminated or revoked.  Performed at  [facility], [address], [place], [place] [address]          Radiology Studies: DG Chest 2 View  Result Date: 08/26/2022 CLINICAL DATA:  Shortness of breath, ongoing congestion, cough, and chest pain for 4 days, history COPD/emphysema EXAM: CHEST - 2 VIEW COMPARISON:  10/13/2019 FINDINGS: Normal heart size, mediastinal contours, and pulmonary vascularity. Atherosclerotic calcification aorta. Calcified granuloma RIGHT upper lobe. Emphysematous and bronchitic changes consistent with COPD. No  pulmonary infiltrate, pleural effusion, or pneumothorax. Diffuse osseous demineralization. IMPRESSION: COPD changes without acute abnormalities. Aortic Atherosclerosis (ICD10-I70.0) and Emphysema (ICD10-J43.9). Electronically Signed   By: Ulyses Southward M.D.   On: 08/26/2022 12:35        Scheduled Meds:  ARIPiprazole  5 mg Oral Daily   atorvastatin  10 mg Oral Daily   dolutegravir  50 mg Oral Daily   emtricitabine-tenofovir AF  1 tablet Oral Daily   enoxaparin (LOVENOX) injection  40 mg Subcutaneous Q24H   ipratropium-albuterol  3 mL Nebulization Q6H   lamoTRIgine  200 mg Oral Daily   losartan  25 mg Oral Daily   mometasone-formoterol  2 puff Inhalation BID   predniSONE  40 mg Oral Q breakfast   traZODone  100 mg Oral QHS   Continuous Infusions:  cefTRIAXone (ROCEPHIN)  IV 200 mL/hr at 08/26/22 1623     LOS: 1 day   Time spent= 35 mins    Gregory Robinson Joline Maxcy, MD [facility]  If 7PM-7AM, please contact night-coverage  08/27/2022, 7:43 AM

## 2022-08-27 NOTE — Progress Notes (Signed)
   08/27/22 1623  Assess: MEWS Score  Temp 97.9 F (36.6 C)  BP (!) 115/55  MAP (mmHg) 72  Pulse Rate 84  Resp 18  SpO2 94 %  O2 Device Nasal Cannula  O2 Flow Rate (L/min) 2 L/min  Assess: MEWS Score  MEWS Temp 0  MEWS Systolic 0  MEWS Pulse 0  MEWS RR 0  MEWS LOC 0  MEWS Score 0  MEWS Score Color Green  Document  Progress note created (see row info) Yes  Assess: SIRS CRITERIA  SIRS Temperature  0  SIRS Pulse 0  SIRS Respirations  0  SIRS WBC 1  SIRS Score Sum  1   No MEWS

## 2022-08-27 NOTE — Progress Notes (Signed)
   08/27/22 1220  Assess: MEWS Score  Pulse Rate (!) 111  Level of Consciousness Alert  SpO2 (!) 87 %  O2 Device Room Air  Patient Activity (if Appropriate) Ambulating (pt ambulated one lap around the nurse's station on room air; pt tired and short of breath, coughing when he came back to his room)  Assess: MEWS Score  MEWS Temp 0  MEWS Systolic 0  MEWS Pulse 2  MEWS RR 0  MEWS LOC 0  MEWS Score 2  MEWS Score Color Yellow  Assess: if the MEWS score is Yellow or Red  Were vital signs taken at a resting state? No  Focused Assessment No change from prior assessment  Does the patient meet 2 or more of the SIRS criteria? Yes  Does the patient have a confirmed or suspected source of infection? No  MEWS guidelines implemented *See Row Information* No, vital signs rechecked  Document  Progress note created (see row info) Yes  Assess: SIRS CRITERIA  SIRS Temperature  0  SIRS Pulse 1  SIRS Respirations  0  SIRS WBC 1  SIRS Score Sum  2   Ambulated per MD order in [facility]; pt verbalized that he wanted to walk without oxygen since he is not on it at home; data indicates results of his ambulation; to recheck VS

## 2022-08-28 DIAGNOSIS — J441 Chronic obstructive pulmonary disease with (acute) exacerbation: Secondary | ICD-10-CM | POA: Diagnosis not present

## 2022-08-28 LAB — CBC
HCT: 33.6 % — ABNORMAL LOW (ref 39.0–52.0)
Hemoglobin: 11.2 g/dL — ABNORMAL LOW (ref 13.0–17.0)
MCH: 33.7 pg (ref 26.0–34.0)
MCHC: 33.3 g/dL (ref 30.0–36.0)
MCV: 101.2 fL — ABNORMAL HIGH (ref 80.0–100.0)
Platelets: 124 10*3/uL — ABNORMAL LOW (ref 150–400)
RBC: 3.32 MIL/uL — ABNORMAL LOW (ref 4.22–5.81)
RDW: 12.6 % (ref 11.5–15.5)
WBC: 5.5 10*3/uL (ref 4.0–10.5)
nRBC: 0 % (ref 0.0–0.2)

## 2022-08-28 LAB — BASIC METABOLIC PANEL
Anion gap: 3 — ABNORMAL LOW (ref 5–15)
BUN: 17 mg/dL (ref 8–23)
CO2: 27 mmol/L (ref 22–32)
Calcium: 8.7 mg/dL — ABNORMAL LOW (ref 8.9–10.3)
Chloride: 109 mmol/L (ref 98–111)
Creatinine, Ser: 1.01 mg/dL (ref 0.61–1.24)
GFR, Estimated: >60 mL/min (ref >60)
Glucose, Bld: 143 mg/dL — ABNORMAL HIGH (ref 70–99)
Potassium: 4.5 mmol/L (ref 3.5–5.1)
Sodium: 139 mmol/L (ref 135–145)

## 2022-08-28 LAB — MAGNESIUM: Magnesium: 2.5 mg/dL — ABNORMAL HIGH (ref 1.7–2.4)

## 2022-08-28 MED ORDER — ALUM & MAG HYDROXIDE-SIMETH 200-200-20 MG/5ML PO SUSP
30.0000 mL | Freq: Four times a day (QID) | ORAL | Status: DC | PRN
  Administered 2022-08-28 (×2): 30 mL via ORAL
  Filled 2022-08-28 (×2): qty 30

## 2022-08-28 MED ORDER — STERILE WATER FOR INJECTION IJ SOLN
INTRAMUSCULAR | Status: AC
  Filled 2022-08-28: qty 10

## 2022-08-28 NOTE — Progress Notes (Signed)
PROGRESS NOTE    Gregory Robinson  JOI:[id number] DOB: April 22, 1956 DOA: 08/26/2022 PCP: Patient, No Pcp Per   Brief Narrative:  66 y.o. male with medical history significant for COPD, depression, HIV on Hart, chronic hepatitis C, bipolar disorder who presents to the ER for evaluation of a 5-day history of worsening shortness of breath from his baseline associated with cough productive of yellow phlegm and wheezing.  Patient admitted, Respiratory panel, chest x-ray showed hyperinflated lungs.  Patient was started on Solu-Medrol, Rocephin, bronchodilators, IS and flutter. Procal neg therefore Abx have been stopped.    Assessment & Plan:  Principal Problem:   COPD with acute exacerbation (HCC) Active Problems:   Acute hypoxic respiratory failure (HCC)   HIV (human immunodeficiency virus infection) (HCC)   Depression   Essential hypertension   Hypokalemia     Assessment and Plan: * COPD with acute exacerbation (HCC) Acute  respiratory failure with hypoxia Still have quite a bit abnormal BS. Currently patient is on steroids, bronchodilators scheduled and as needed, I-S/flutter valve.  Procalcitonin is negative therefore will stop antibiotics  Acute hypoxic respiratory failure (HCC) Does not use any oxygen at home, on room air patient was 85% on 2 L nasal cannula  Hypokalemia Resolved  Essential hypertension Continue Cozaar  Depression Continue trazodone, Descovy and Abilify  HIV (human immunodeficiency virus infection) (HCC) Continue Dolutegravir   Macrocytic anemia - no Signs of bleeding; monitor       DVT prophylaxis: Lovenox Code Status: Full code Family Communication: Spoke with patient's sister yesterday  Status is: Inpatient Significant breath sounds.  Maintain hospital stay for at least next 24 hours  Subjective: Still wheezing with exertional SOB  Examination: Constitutional: Not in acute distress Respiratory: diffuse b/l rhonchi Cardiovascular: Normal sinus  rhythm, no rubs Abdomen: Nontender nondistended good bowel sounds Musculoskeletal: No edema noted Skin: No rashes seen Neurologic: CN 2-12 grossly intact.  And nonfocal Psychiatric: Normal judgment and insight. Alert and oriented x 3. Normal mood.     Objective: Vitals:   08/27/22 2013 08/28/22 0114 08/28/22 0621 08/28/22 0734  BP:  113/63 (!) 109/50   Pulse:  76 69   Resp:  20 16   Temp: 97.9 F (36.6 C) 97.9 F (36.6 C) 98 F (36.7 C)   TempSrc: Oral     SpO2:  94% 92% 97%  Weight:      Height:       No intake or output data in the 24 hours ending 08/28/22 0742  Filed Weights   08/26/22 1211  Weight: 97.5 kg     Data Reviewed:   CBC: Recent Labs  Lab 08/26/22 1346 08/27/22 0517 08/28/22 0508  WBC 3.6* 2.2* 5.5  HGB 11.7* 11.1* 11.2*  HCT 34.7* 33.1* 33.6*  MCV 101.8* 101.5* 101.2*  PLT 115* 108* 124*   Basic Metabolic Panel: Recent Labs  Lab 08/26/22 1346 08/26/22 1501 08/27/22 0517 08/28/22 0508  NA 137  --  138 139  K 3.2*  --  4.1 4.5  CL 102  --  108 109  CO2 28  --  26 27  GLUCOSE 130*  --  162* 143*  BUN 10  --  12 17  CREATININE 1.09  --  1.00 1.01  CALCIUM 9.1  --  8.9 8.7*  MG  --  5.0*  --  2.5*   GFR: Estimated Creatinine Clearance: 88.2 mL/min (by C-G formula based on SCr of 1.01 mg/dL). Liver Function Tests: No results for input(s): "AST", "ALT", "  ALKPHOS", "BILITOT", "PROT", "ALBUMIN" in the last 168 hours. No results for input(s): "LIPASE", "AMYLASE" in the last 168 hours. No results for input(s): "AMMONIA" in the last 168 hours. Coagulation Profile: No results for input(s): "INR", "PROTIME" in the last 168 hours. Cardiac Enzymes: No results for input(s): "CKTOTAL", "CKMB", "CKMBINDEX", "TROPONINI" in the last 168 hours. BNP (last 3 results) No results for input(s): "PROBNP" in the last 8760 hours. HbA1C: No results for input(s): "HGBA1C" in the last 72 hours. CBG: No results for input(s): "GLUCAP" in the last 168  hours. Lipid Profile: No results for input(s): "CHOL", "HDL", "LDLCALC", "TRIG", "CHOLHDL", "LDLDIRECT" in the last 72 hours. Thyroid Function Tests: No results for input(s): "TSH", "T4TOTAL", "FREET4", "T3FREE", "THYROIDAB" in the last 72 hours. Anemia Panel: No results for input(s): "VITAMINB12", "FOLATE", "FERRITIN", "TIBC", "IRON", "RETICCTPCT" in the last 72 hours. Sepsis Labs: Recent Labs  Lab 08/27/22 0515  PROCALCITON <0.10    Recent Results (from the past 240 hour(s))  Resp Panel by RT-PCR (Flu A&B, Covid) Anterior Nasal Swab     Status: None   Collection Time: 08/26/22 12:16 PM   Specimen: Anterior Nasal Swab  Result Value Ref Range Status   SARS Coronavirus 2 by RT PCR NEGATIVE NEGATIVE Final    Comment: (NOTE) SARS-CoV-2 target nucleic acids are NOT DETECTED.  The SARS-CoV-2 RNA is generally detectable in upper respiratory specimens during the acute phase of infection. The lowest concentration of SARS-CoV-2 viral copies this assay can detect is 138 copies/mL. A negative result does not preclude SARS-Cov-2 infection and should not be used as the sole basis for treatment or other patient management decisions. A negative result may occur with  improper specimen collection/handling, submission of specimen other than nasopharyngeal swab, presence of viral mutation(s) within the areas targeted by this assay, and inadequate number of viral copies(<138 copies/mL). A negative result must be combined with clinical observations, patient history, and epidemiological information. The expected result is Negative.  Fact Sheet for Patients:  BloggerCourse.com  Fact Sheet for Healthcare Providers:  SeriousBroker.it  This test is no t yet approved or cleared by the [place] FDA and  has been authorized for detection and/or diagnosis of SARS-CoV-2 by FDA under an Emergency Use Authorization (EUA). This EUA will remain  in  effect (meaning this test can be used) for the duration of the COVID-19 declaration under Section 564(b)(1) of the Act, 21 U.S.C.section 360bbb-3(b)(1), unless the authorization is terminated  or revoked sooner.       Influenza A by PCR NEGATIVE NEGATIVE Final   Influenza B by PCR NEGATIVE NEGATIVE Final    Comment: (NOTE) The Xpert Xpress SARS-CoV-2/FLU/RSV plus assay is intended as an aid in the diagnosis of influenza from Nasopharyngeal swab specimens and should not be used as a sole basis for treatment. Nasal washings and aspirates are unacceptable for Xpert Xpress SARS-CoV-2/FLU/RSV testing.  Fact Sheet for Patients: BloggerCourse.com  Fact Sheet for Healthcare Providers: SeriousBroker.it  This test is not yet approved or cleared by the [place] FDA and has been authorized for detection and/or diagnosis of SARS-CoV-2 by FDA under an Emergency Use Authorization (EUA). This EUA will remain in effect (meaning this test can be used) for the duration of the COVID-19 declaration under Section 564(b)(1) of the Act, 21 U.S.C. section 360bbb-3(b)(1), unless the authorization is terminated or revoked.  Performed at [facility], [address] [place], [place] [address]          Radiology Studies: DG Chest 2  View  Result Date: 08/26/2022 CLINICAL DATA:  Shortness of breath, ongoing congestion, cough, and chest pain for 4 days, history COPD/emphysema EXAM: CHEST - 2 VIEW COMPARISON:  10/13/2019 FINDINGS: Normal heart size, mediastinal contours, and pulmonary vascularity. Atherosclerotic calcification aorta. Calcified granuloma RIGHT upper lobe. Emphysematous and bronchitic changes consistent with COPD. No pulmonary infiltrate, pleural effusion, or pneumothorax. Diffuse osseous demineralization. IMPRESSION: COPD changes without acute abnormalities. Aortic Atherosclerosis (ICD10-I70.0) and Emphysema (ICD10-J43.9).  Electronically Signed   By: Ulyses Southward M.D.   On: 08/26/2022 12:35        Scheduled Meds:  ARIPiprazole  5 mg Oral Daily   atorvastatin  10 mg Oral Daily   dolutegravir  50 mg Oral Daily   emtricitabine-tenofovir AF  1 tablet Oral Daily   enoxaparin (LOVENOX) injection  40 mg Subcutaneous Q24H   ipratropium-albuterol  3 mL Nebulization Q6H   lamoTRIgine  200 mg Oral Daily   losartan  25 mg Oral Daily   methylPREDNISolone (SOLU-MEDROL) injection  40 mg Intravenous Q8H   mometasone-formoterol  2 puff Inhalation BID   traZODone  100 mg Oral QHS   Continuous Infusions:     LOS: 2 days   Time spent= 35 mins    Freddie Nghiem Joline Maxcy, MD [facility]  If 7PM-7AM, please contact night-coverage  08/28/2022, 7:42 AM

## 2022-08-29 DIAGNOSIS — J441 Chronic obstructive pulmonary disease with (acute) exacerbation: Secondary | ICD-10-CM | POA: Diagnosis not present

## 2022-08-29 LAB — BASIC METABOLIC PANEL
Anion gap: 5 (ref 5–15)
BUN: 20 mg/dL (ref 8–23)
CO2: 28 mmol/L (ref 22–32)
Calcium: 9.2 mg/dL (ref 8.9–10.3)
Chloride: 103 mmol/L (ref 98–111)
Creatinine, Ser: 1.07 mg/dL (ref 0.61–1.24)
GFR, Estimated: >60 mL/min (ref >60)
Glucose, Bld: 138 mg/dL — ABNORMAL HIGH (ref 70–99)
Potassium: 4.3 mmol/L (ref 3.5–5.1)
Sodium: 136 mmol/L (ref 135–145)

## 2022-08-29 LAB — MAGNESIUM: Magnesium: 2.7 mg/dL — ABNORMAL HIGH (ref 1.7–2.4)

## 2022-08-29 MED ORDER — STERILE WATER FOR INJECTION IJ SOLN
INTRAMUSCULAR | Status: AC
  Administered 2022-08-29: 2 mL
  Filled 2022-08-29: qty 10

## 2022-08-29 MED ORDER — IPRATROPIUM-ALBUTEROL 0.5-2.5 (3) MG/3ML IN SOLN
3.0000 mL | Freq: Three times a day (TID) | RESPIRATORY_TRACT | Status: DC
  Administered 2022-08-30 – 2022-08-31 (×4): 3 mL via RESPIRATORY_TRACT
  Filled 2022-08-29 (×4): qty 3

## 2022-08-29 MED ORDER — METHYLPREDNISOLONE SODIUM SUCC 40 MG IJ SOLR
40.0000 mg | Freq: Two times a day (BID) | INTRAMUSCULAR | Status: DC
  Administered 2022-08-29 – 2022-08-30 (×2): 40 mg via INTRAVENOUS
  Filled 2022-08-29 (×2): qty 1

## 2022-08-29 NOTE — TOC Initial Note (Signed)
Transition of Care [facility]) - Initial/Assessment Note    Patient Details  Name: Tavone Caesar MRN: [id number] Date of Birth: 1955-10-28  [facility]) CM/SW Contact:    Truddie Hidden, RN Phone Number: 08/29/2022, 10:52 AM  Clinical Narrative:                  Transition of Care [facility]) Screening Note   Patient Details  Name: Senai Kingsley Sheffler Date of Birth: September 27, 1956   Transition of Care [facility]) CM/SW Contact:    Truddie Hidden, RN Phone Number: 08/29/2022, 10:52 AM    Transition of Care Department (TOC) has reviewed patient and no TOC needs have been identified at this time. We will continue to monitor patient advancement through interdisciplinary progression rounds. If new patient transition needs arise, please place a TOC consult.          Patient Goals and CMS Choice        Expected Discharge Plan and Services                                                Prior Living Arrangements/Services                       Activities of Daily Living Home Assistive Devices/Equipment: None ADL Screening (condition at time of admission) Patient's cognitive ability adequate to safely complete daily activities?: Yes Is the patient deaf or have difficulty hearing?: No Does the patient have difficulty seeing, even when wearing glasses/contacts?: Yes Does the patient have difficulty concentrating, remembering, or making decisions?: No Patient able to express need for assistance with ADLs?: Yes Does the patient have difficulty dressing or bathing?: No Independently performs ADLs?: Yes (appropriate for developmental age) Does the patient have difficulty walking or climbing stairs?: No Weakness of Legs: None Weakness of Arms/Hands: None  Permission Sought/Granted                  Emotional Assessment              Admission diagnosis:  COPD exacerbation (HCC) [J44.1] COPD with acute exacerbation (HCC) [J44.1] Acute on chronic respiratory  failure with hypoxia (HCC) [J96.21] Patient Active Problem List   Diagnosis Date Noted   COPD with acute exacerbation (HCC) 08/26/2022   Acute hypoxic respiratory failure (HCC) 08/26/2022   HIV (human immunodeficiency virus infection) (HCC) 08/26/2022   Depression 08/26/2022   Essential hypertension 08/26/2022   Hypokalemia 08/26/2022   COPD exacerbation (HCC) 01/11/2016   PCP:  Patient, No Pcp Per Pharmacy:   [facility], [place] - [address] [place] [address] Phone: [phone] Fax: [phone]     Social Determinants of Health (SDOH) Interventions    Readmission Risk Interventions     No data to display

## 2022-08-29 NOTE — Progress Notes (Signed)
PROGRESS NOTE    Gregory Robinson  [id number] DOB: 1956-09-09 DOA: 08/26/2022 PCP: Gregory Robinson, No Pcp Per   Brief Narrative:  66 y.o. male with medical history significant for COPD, depression, HIV on Hart, chronic hepatitis C, bipolar disorder who presents to the ER for evaluation of a 5-day history of worsening shortness of breath from his baseline associated with cough productive of yellow phlegm and wheezing.  Gregory Robinson admitted, Respiratory panel, chest x-ray showed hyperinflated lungs.  Gregory Robinson was started on Solu-Medrol, Rocephin, bronchodilators, IS and flutter. Procal neg therefore Abx have been stopped.  Overall he has been very slow to improve.   Assessment & Plan:  Principal Problem:   COPD with acute exacerbation ([name]) Active Problems:   Acute hypoxic respiratory failure (HCC)   HIV (human immunodeficiency virus infection) ([place])   Depression   Essential hypertension   Hypokalemia     Assessment and Plan: * COPD with acute exacerbation ([name]) Acute  respiratory failure with hypoxia Still has abnormal breath sounds, very slow to improve. Currently Gregory Robinson is on steroids, bronchodilators scheduled and as needed, I-S/flutter valve.  Procalcitonin is negative therefore will stop antibiotics  Acute hypoxic respiratory failure (HCC) Does not use any oxygen at home, on room air Gregory Robinson was 85% on 2 L nasal cannula  Hypokalemia Resolved  Essential hypertension Continue Cozaar  Depression Continue trazodone, Descovy and Abilify  HIV (human immunodeficiency virus infection) ([name]) Continue Dolutegravir   Macrocytic anemia - no Signs of bleeding; monitor       DVT prophylaxis: Lovenox Code Status: Full code Family Communication: Spoke with Gregory Robinson's sister on 11/11  Status is: Inpatient Continues to have abnormal breath sounds.  Slow to improve.  Hopefully discharge in next 24 to 48 hours  Subjective: Breathing slightly better but continues to have significant  wheezing and coughing.  He is using incentive spirometer and flutter valve as advised  Examination: Constitutional: Not in acute distress Respiratory: Diffuse bilateral rhonchi with minimal expiratory wheezing Cardiovascular: Normal sinus rhythm, no rubs Abdomen: Nontender nondistended good bowel sounds Musculoskeletal: No edema noted Skin: No rashes seen Neurologic: CN 2-12 grossly intact.  And nonfocal Psychiatric: Normal judgment and insight. Alert and oriented x 3. Normal mood.  Objective: Vitals:   08/29/22 0645 08/29/22 0646 08/29/22 0700 08/29/22 0734  BP: 124/62 124/62 118/60   Pulse: 62 63 65   Resp: 16 16 17    Temp: (!) 97.5 F (36.4 C) (!) 97.5 F (36.4 C) 97.7 F (36.5 C)   TempSrc:   Oral   SpO2:  95% 92% 94%  Weight:      Height:        Intake/Output Summary (Last 24 hours) at 08/29/2022 0740 Last data filed at 08/28/2022 1921 Gross per 24 hour  Intake 480 ml  Output --  Net 480 ml    Filed Weights   08/26/22 1211  Weight: 97.5 kg     Data Reviewed:   CBC: Recent Labs  Lab 08/26/22 1346 08/27/22 0517 08/28/22 0508  WBC 3.6* 2.2* 5.5  HGB 11.7* 11.1* 11.2*  HCT 34.7* 33.1* 33.6*  MCV 101.8* 101.5* 101.2*  PLT 115* 108* [id number]*   Basic Metabolic Panel: Recent Labs  Lab 08/26/22 1346 08/26/22 1501 08/27/22 0517 08/28/22 0508 08/29/22 0527  NA 137  --  138 139 136  K 3.2*  --  4.1 4.5 4.3  CL 102  --  108 109 103  CO2 28  --  26 27 28   GLUCOSE 130*  --  162* 143* 138*  BUN 10  --  12 17 20   CREATININE 1.09  --  1.00 1.01 1.07  CALCIUM 9.1  --  8.9 8.7* 9.2  MG  --  5.0*  --  2.5* 2.7*   GFR: Estimated Creatinine Clearance: 83.3 mL/min (by C-G formula based on SCr of 1.07 mg/dL). Liver Function Tests: No results for input(s): "AST", "ALT", "ALKPHOS", "BILITOT", "PROT", "ALBUMIN" in the last 168 hours. No results for input(s): "LIPASE", "AMYLASE" in the last 168 hours. No results for input(s): "AMMONIA" in the last 168  hours. Coagulation Profile: No results for input(s): "INR", "PROTIME" in the last 168 hours. Cardiac Enzymes: No results for input(s): "CKTOTAL", "CKMB", "CKMBINDEX", "TROPONINI" in the last 168 hours. BNP (last 3 results) No results for input(s): "PROBNP" in the last 8760 hours. HbA1C: No results for input(s): "HGBA1C" in the last 72 hours. CBG: No results for input(s): "GLUCAP" in the last 168 hours. Lipid Profile: No results for input(s): "CHOL", "HDL", "LDLCALC", "TRIG", "CHOLHDL", "LDLDIRECT" in the last 72 hours. Thyroid Function Tests: No results for input(s): "TSH", "T4TOTAL", "FREET4", "T3FREE", "THYROIDAB" in the last 72 hours. Anemia Panel: No results for input(s): "VITAMINB12", "FOLATE", "FERRITIN", "TIBC", "IRON", "RETICCTPCT" in the last 72 hours. Sepsis Labs: Recent Labs  Lab 08/27/22 0515  PROCALCITON <0.10    Recent Results (from the past 240 hour(s))  Resp Panel by RT-PCR (Flu A&B, Covid) Anterior Nasal Swab     Status: None   Collection Time: 08/26/22 12:16 PM   Specimen: Anterior Nasal Swab  Result Value Ref Range Status   SARS Coronavirus 2 by RT PCR NEGATIVE NEGATIVE Final    Comment: (NOTE) SARS-CoV-2 target nucleic acids are NOT DETECTED.  The SARS-CoV-2 RNA is generally detectable in upper respiratory specimens during the acute phase of infection. The lowest concentration of SARS-CoV-2 viral copies this assay can detect is 138 copies/mL. A negative result does not preclude SARS-Cov-2 infection and should not be used as the sole basis for treatment or other Gregory Robinson management decisions. A negative result may occur with  improper specimen collection/handling, submission of specimen other than nasopharyngeal swab, presence of viral mutation(s) within the areas targeted by this assay, and inadequate number of viral copies(<138 copies/mL). A negative result must be combined with clinical observations, Gregory Robinson history, and epidemiological information.  The expected result is Negative.  Fact Sheet for Patients:  13/10/23  Fact Sheet for Healthcare Providers:  BloggerCourse.com  This test is no t yet approved or cleared by the [place] FDA and  has been authorized for detection and/or diagnosis of SARS-CoV-2 by FDA under an Emergency Use Authorization (EUA). This EUA will remain  in effect (meaning this test can be used) for the duration of the COVID-19 declaration under Section 564(b)(1) of the Act, 21 U.S.C.section 360bbb-3(b)(1), unless the authorization is terminated  or revoked sooner.       Influenza A by PCR NEGATIVE NEGATIVE Final   Influenza B by PCR NEGATIVE NEGATIVE Final    Comment: (NOTE) The Xpert Xpress SARS-CoV-2/FLU/RSV plus assay is intended as an aid in the diagnosis of influenza from Nasopharyngeal swab specimens and should not be used as a sole basis for treatment. Nasal washings and aspirates are unacceptable for Xpert Xpress SARS-CoV-2/FLU/RSV testing.  Fact Sheet for Patients: [place]  Fact Sheet for Healthcare Providers: BloggerCourse.com  This test is not yet approved or cleared by the [place]ious[place] FDA and has been authorized for detection and/or diagnosis of SARS-CoV-2 by FDA under an  Emergency Use Authorization (EUA). This EUA will remain in effect (meaning this test can be used) for the duration of the COVID-19 declaration under Section 564(b)(1) of the Act, 21 U.S.C. section 360bbb-3(b)(1), unless the authorization is terminated or revoked.  Performed at [facility], [address] [place], [place] [address]          Radiology Studies: No results found.      Scheduled Meds:  ARIPiprazole  5 mg Oral Daily   atorvastatin  10 mg Oral Daily   dolutegravir  50 mg Oral Daily   emtricitabine-tenofovir AF  1 tablet Oral Daily   enoxaparin (LOVENOX)  injection  40 mg Subcutaneous Q24H   ipratropium-albuterol  3 mL Nebulization Q6H   lamoTRIgine  200 mg Oral Daily   losartan  25 mg Oral Daily   methylPREDNISolone (SOLU-MEDROL) injection  40 mg Intravenous Q8H   mometasone-formoterol  2 puff Inhalation BID   traZODone  100 mg Oral QHS   Continuous Infusions:     LOS: 3 days   Time spent= 35 mins    Zeniyah Peaster Arsenio Loader, MD [facility]  If 7PM-7AM, please contact night-coverage  08/29/2022, 7:40 AM

## 2022-08-29 NOTE — Care Management Important Message (Signed)
Important Message  Patient Details  Name: Gregory Robinson MRN: [id number] Date of Birth: [birth year]   Medicare Important Message Given:  N/A - LOS <3 / Initial given by admissions     [name] 08/29/2022, 10:03 AM

## 2022-08-30 DIAGNOSIS — J441 Chronic obstructive pulmonary disease with (acute) exacerbation: Secondary | ICD-10-CM | POA: Diagnosis not present

## 2022-08-30 LAB — BASIC METABOLIC PANEL
Anion gap: 5 (ref 5–15)
BUN: 20 mg/dL (ref 8–23)
CO2: 28 mmol/L (ref 22–32)
Calcium: 9.2 mg/dL (ref 8.9–10.3)
Chloride: 103 mmol/L (ref 98–111)
Creatinine, Ser: 1.27 mg/dL — ABNORMAL HIGH (ref 0.61–1.24)
GFR, Estimated: >60 mL/min (ref >60)
Glucose, Bld: 106 mg/dL — ABNORMAL HIGH (ref 70–99)
Potassium: 3.9 mmol/L (ref 3.5–5.1)
Sodium: 136 mmol/L (ref 135–145)

## 2022-08-30 LAB — MAGNESIUM: Magnesium: 2.5 mg/dL — ABNORMAL HIGH (ref 1.7–2.4)

## 2022-08-30 LAB — CBC
HCT: 35.7 % — ABNORMAL LOW (ref 39.0–52.0)
Hemoglobin: 11.6 g/dL — ABNORMAL LOW (ref 13.0–17.0)
MCH: 33.4 pg (ref 26.0–34.0)
MCHC: 32.5 g/dL (ref 30.0–36.0)
MCV: 102.9 fL — ABNORMAL HIGH (ref 80.0–100.0)
Platelets: 148 10*3/uL — ABNORMAL LOW (ref 150–400)
RBC: 3.47 MIL/uL — ABNORMAL LOW (ref 4.22–5.81)
RDW: 13 % (ref 11.5–15.5)
WBC: 4 10*3/uL (ref 4.0–10.5)
nRBC: 0 % (ref 0.0–0.2)

## 2022-08-30 MED ORDER — METHYLPREDNISOLONE SODIUM SUCC 40 MG IJ SOLR
40.0000 mg | Freq: Every day | INTRAMUSCULAR | Status: DC
  Administered 2022-08-31 – 2022-09-01 (×2): 40 mg via INTRAVENOUS
  Filled 2022-08-30 (×2): qty 1

## 2022-08-30 MED ORDER — SODIUM CHLORIDE 0.9 % IV BOLUS
500.0000 mL | Freq: Once | INTRAVENOUS | Status: AC
  Administered 2022-08-30: 500 mL via INTRAVENOUS

## 2022-08-30 NOTE — Care Management Important Message (Signed)
Important Message  Patient Details  Name: Gregory Robinson MRN: [id number] Date of Birth: [birth year]   Medicare Important Message Given:  Yes     [name] [name] 08/30/2022, 12:04 PM

## 2022-08-30 NOTE — Progress Notes (Signed)
PROGRESS NOTE    Gregory Robinson  VQM:[id number] DOB: 1956-06-26 DOA: 08/26/2022 PCP: Patient, No Pcp Per   Brief Narrative:  66 y.o. male with medical history significant for COPD, depression, HIV on Hart, chronic hepatitis C, bipolar disorder who presents to the ER for evaluation of a 5-day history of worsening shortness of breath from his baseline associated with cough productive of yellow phlegm and wheezing.  Patient admitted, Respiratory panel, chest x-ray showed hyperinflated lungs.  Patient was started on Solu-Medrol, Rocephin, bronchodilators, IS and flutter. Procal neg therefore Abx have been stopped.  Overall he has been very slow to improve.   Assessment & Plan:  Principal Problem:   COPD with acute exacerbation (HCC) Active Problems:   Acute hypoxic respiratory failure (HCC)   HIV (human immunodeficiency virus infection) (HCC)   Depression   Essential hypertension   Hypokalemia     Assessment and Plan: * COPD with acute exacerbation (HCC) Acute  respiratory failure with hypoxia Still having quite a bit of expiratory wheezing. Currently patient is on steroids daily, bronchodilators scheduled and as needed, I-S/flutter valve.  Procalcitonin is negative therefore will stop antibiotics.  I think eventually he will need long taper course of steroids.  Acute hypoxic respiratory failure (HCC) Does not use any oxygen at home, on room air patient was 85% on 2 L nasal cannula  Hypokalemia Resolved  Essential hypertension Continue Cozaar  Depression Continue trazodone, Descovy and Abilify  HIV (human immunodeficiency virus infection) (HCC) Continue Dolutegravir   Macrocytic anemia - no Signs of bleeding; monitor       DVT prophylaxis: Lovenox Code Status: Full code Family Communication: Spoke with patient's sister on 11/11  Status is: Inpatient Continues to have abnormal breath sounds with minimal improvement.  Hopefully discharge in next 24 to 48  hours  Subjective: Tells me his breathing is slightly better compared to yesterday including his wheezing.  Now he is able to bring up some cough/mucus.  Examination: Constitutional: Not in acute distress Respiratory: Diffuse rhonchi with mild expiratory wheezing especially at bases Cardiovascular: Normal sinus rhythm, no rubs Abdomen: Nontender nondistended good bowel sounds Musculoskeletal: No edema noted Skin: No rashes seen Neurologic: CN 2-12 grossly intact.  And nonfocal Psychiatric: Normal judgment and insight. Alert and oriented x 3. Normal mood.  Objective: Vitals:   08/29/22 2056 08/30/22 0419 08/30/22 0810 08/30/22 0818  BP:  120/69  (!) 137/58  Pulse:  70  (!) 57  Resp:  18  18  Temp:  97.7 F (36.5 C)  98.5 F (36.9 C)  TempSrc:  Oral  Oral  SpO2: 93% 95% 93% 96%  Weight:      Height:        Intake/Output Summary (Last 24 hours) at 08/30/2022 1047 Last data filed at 08/30/2022 1036 Gross per 24 hour  Intake 240 ml  Output --  Net 240 ml    Filed Weights   08/26/22 1211  Weight: 97.5 kg     Data Reviewed:   CBC: Recent Labs  Lab 08/26/22 1346 08/27/22 0517 08/28/22 0508 08/30/22 0449  WBC 3.6* 2.2* 5.5 4.0  HGB 11.7* 11.1* 11.2* 11.6*  HCT 34.7* 33.1* 33.6* 35.7*  MCV 101.8* 101.5* 101.2* 102.9*  PLT 115* 108* 124* 148*   Basic Metabolic Panel: Recent Labs  Lab 08/26/22 1346 08/26/22 1501 08/27/22 0517 08/28/22 0508 08/29/22 0527 08/30/22 0451  NA 137  --  138 139 136 136  K 3.2*  --  4.1 4.5 4.3 3.9  CL  102  --  108 109 103 103  CO2 28  --  26 27 28 28   GLUCOSE 130*  --  162* 143* 138* 106*  BUN 10  --  12 17 20 20   CREATININE 1.09  --  1.00 1.01 1.07 1.27*  CALCIUM 9.1  --  8.9 8.7* 9.2 9.2  MG  --  5.0*  --  2.5* 2.7* 2.5*   GFR: Estimated Creatinine Clearance: 70.2 mL/min (A) (by C-G formula based on SCr of 1.27 mg/dL (H)). Liver Function Tests: No results for input(s): "AST", "ALT", "ALKPHOS", "BILITOT", "PROT",  "ALBUMIN" in the last 168 hours. No results for input(s): "LIPASE", "AMYLASE" in the last 168 hours. No results for input(s): "AMMONIA" in the last 168 hours. Coagulation Profile: No results for input(s): "INR", "PROTIME" in the last 168 hours. Cardiac Enzymes: No results for input(s): "CKTOTAL", "CKMB", "CKMBINDEX", "TROPONINI" in the last 168 hours. BNP (last 3 results) No results for input(s): "PROBNP" in the last 8760 hours. HbA1C: No results for input(s): "HGBA1C" in the last 72 hours. CBG: No results for input(s): "GLUCAP" in the last 168 hours. Lipid Profile: No results for input(s): "CHOL", "HDL", "LDLCALC", "TRIG", "CHOLHDL", "LDLDIRECT" in the last 72 hours. Thyroid Function Tests: No results for input(s): "TSH", "T4TOTAL", "FREET4", "T3FREE", "THYROIDAB" in the last 72 hours. Anemia Panel: No results for input(s): "VITAMINB12", "FOLATE", "FERRITIN", "TIBC", "IRON", "RETICCTPCT" in the last 72 hours. Sepsis Labs: Recent Labs  Lab 08/27/22 0515  PROCALCITON <0.10    Recent Results (from the past 240 hour(s))  Resp Panel by RT-PCR (Flu A&B, Covid) Anterior Nasal Swab     Status: None   Collection Time: 08/26/22 12:16 PM   Specimen: Anterior Nasal Swab  Result Value Ref Range Status   SARS Coronavirus 2 by RT PCR NEGATIVE NEGATIVE Final    Comment: (NOTE) SARS-CoV-2 target nucleic acids are NOT DETECTED.  The SARS-CoV-2 RNA is generally detectable in upper respiratory specimens during the acute phase of infection. The lowest concentration of SARS-CoV-2 viral copies this assay can detect is 138 copies/mL. A negative result does not preclude SARS-Cov-2 infection and should not be used as the sole basis for treatment or other patient management decisions. A negative result may occur with  improper specimen collection/handling, submission of specimen other than nasopharyngeal swab, presence of viral mutation(s) within the areas targeted by this assay, and inadequate  number of viral copies(<138 copies/mL). A negative result must be combined with clinical observations, patient history, and epidemiological information. The expected result is Negative.  Fact Sheet for Patients:  13/11/23  Fact Sheet for Healthcare Providers:  13/10/23  This test is no t yet approved or cleared by the [employer] FDA and  has been authorized for detection and/or diagnosis of SARS-CoV-2 by FDA under an Emergency Use Authorization (EUA). This EUA will remain  in effect (meaning this test can be used) for the duration of the COVID-19 declaration under Section 564(b)(1) of the Act, 21 U.S.C.section 360bbb-3(b)(1), unless the authorization is terminated  or revoked sooner.       Influenza A by PCR NEGATIVE NEGATIVE Final   Influenza B by PCR NEGATIVE NEGATIVE Final    Comment: (NOTE) The Xpert Xpress SARS-CoV-2/FLU/RSV plus assay is intended as an aid in the diagnosis of influenza from Nasopharyngeal swab specimens and should not be used as a sole basis for treatment. Nasal washings and aspirates are unacceptable for Xpert Xpress SARS-CoV-2/FLU/RSV testing.  Fact Sheet for Patients: SeriousBroker.it  Fact Sheet for  Healthcare Providers: SeriousBroker.it  This test is not yet approved or cleared by the [place] and has been authorized for detection and/or diagnosis of SARS-CoV-2 by FDA under an Emergency Use Authorization (EUA). This EUA will remain in effect (meaning this test can be used) for the duration of the COVID-19 declaration under Section 564(b)(1) of the Act, 21 U.S.C. section 360bbb-3(b)(1), unless the authorization is terminated or revoked.  Performed at [facility], [address] [place], [place] [address]          Radiology Studies: No results found.      Scheduled Meds:  ARIPiprazole  5 mg  Oral Daily   atorvastatin  10 mg Oral Daily   dolutegravir  50 mg Oral Daily   emtricitabine-tenofovir AF  1 tablet Oral Daily   enoxaparin (LOVENOX) injection  40 mg Subcutaneous Q24H   ipratropium-albuterol  3 mL Nebulization TID   lamoTRIgine  200 mg Oral Daily   losartan  25 mg Oral Daily   methylPREDNISolone (SOLU-MEDROL) injection  40 mg Intravenous Q12H   mometasone-formoterol  2 puff Inhalation BID   traZODone  100 mg Oral QHS   Continuous Infusions:     LOS: 4 days   Time spent= 35 mins    Keita Demarco Joline Maxcy, MD [facility]  If 7PM-7AM, please contact night-coverage  08/30/2022, 10:47 AM

## 2022-08-30 NOTE — Plan of Care (Signed)

## 2022-08-31 DIAGNOSIS — J441 Chronic obstructive pulmonary disease with (acute) exacerbation: Secondary | ICD-10-CM | POA: Diagnosis not present

## 2022-08-31 LAB — BASIC METABOLIC PANEL
Anion gap: 8 (ref 5–15)
BUN: 20 mg/dL (ref 8–23)
CO2: 27 mmol/L (ref 22–32)
Calcium: 9.3 mg/dL (ref 8.9–10.3)
Chloride: 106 mmol/L (ref 98–111)
Creatinine, Ser: 1.33 mg/dL — ABNORMAL HIGH (ref 0.61–1.24)
GFR, Estimated: 58 mL/min — ABNORMAL LOW (ref >60)
Glucose, Bld: 97 mg/dL (ref 70–99)
Potassium: 3.7 mmol/L (ref 3.5–5.1)
Sodium: 141 mmol/L (ref 135–145)

## 2022-08-31 LAB — MAGNESIUM: Magnesium: 2.3 mg/dL (ref 1.7–2.4)

## 2022-08-31 MED ORDER — PANTOPRAZOLE SODIUM 40 MG PO TBEC
40.0000 mg | DELAYED_RELEASE_TABLET | Freq: Every day | ORAL | Status: DC
  Administered 2022-08-31 – 2022-09-03 (×4): 40 mg via ORAL
  Filled 2022-08-31 (×4): qty 1

## 2022-08-31 MED ORDER — ARFORMOTEROL TARTRATE 15 MCG/2ML IN NEBU
15.0000 ug | INHALATION_SOLUTION | Freq: Two times a day (BID) | RESPIRATORY_TRACT | Status: DC
  Administered 2022-08-31 – 2022-09-03 (×5): 15 ug via RESPIRATORY_TRACT
  Filled 2022-08-31 (×8): qty 2

## 2022-08-31 MED ORDER — IPRATROPIUM-ALBUTEROL 0.5-2.5 (3) MG/3ML IN SOLN
3.0000 mL | Freq: Four times a day (QID) | RESPIRATORY_TRACT | Status: DC
  Administered 2022-08-31 – 2022-09-01 (×4): 3 mL via RESPIRATORY_TRACT
  Filled 2022-08-31 (×4): qty 3

## 2022-08-31 MED ORDER — BUDESONIDE 0.25 MG/2ML IN SUSP
0.2500 mg | Freq: Two times a day (BID) | RESPIRATORY_TRACT | Status: DC
  Administered 2022-08-31 – 2022-09-03 (×7): 0.25 mg via RESPIRATORY_TRACT
  Filled 2022-08-31 (×7): qty 2

## 2022-08-31 NOTE — Progress Notes (Signed)
PROGRESS NOTE    Gregory Robinson  [id number]:[id number] DOB: 11/29/1955 DOA: 08/26/2022 PCP: Patient, No Pcp Per    Brief Narrative:  66 y.o. male with medical history significant for COPD, depression, HIV on Hart, chronic hepatitis C, bipolar disorder who presents to the ER for evaluation of a 5-day history of worsening shortness of breath from his baseline associated with cough productive of yellow phlegm and wheezing.  Patient admitted, Respiratory panel, chest x-ray showed hyperinflated lungs.  Patient was started on Solu-Medrol, Rocephin, bronchodilators, IS and flutter. Procal neg therefore Abx have been stopped.  Overall he has been very slow to improve.    Assessment & Plan:   Principal Problem:   COPD with acute exacerbation (HCC) Active Problems:   Acute hypoxic respiratory failure (HCC)   HIV (human immunodeficiency virus infection) (HCC)   Depression   Essential hypertension   Hypokalemia   COPD with acute exacerbation (HCC) Acute  respiratory failure with hypoxia Wheezing improved.  Patient remains with coarse breath sounds and poor air movement. Procalcitonin negative, antibiotics discontinued Plan: Increase scheduled DuoNebs to every 4 hours Add Brovana twice daily, Pulmicort twice daily Hold Dulera Encourage I-S and flutter use Continue daily steroids, wean to prednisone tomorrow with plans to further wean as outpatient PPI for stress ulcer prophylaxis   Acute hypoxic respiratory failure (HCC) Attempted wean as tolerated Goal saturation 88 to 92% We will perform ambulatory desat test in a.m.  Hypokalemia Resolved   Essential hypertension Continue Cozaar   Depression Continue trazodone, Descovy and Abilify   HIV (human immunodeficiency virus infection) (HCC) Continue Dolutegravir     Macrocytic anemia - no Signs of bleeding; monitor   DVT prophylaxis: SQ Lovenox Code Status: Full Family Communication: Sister Dr. Kreg Shropshire [phone] on  11/15 Disposition Plan: Status is: Inpatient Remains inpatient appropriate because: COPD exacerbation.  Hypoxia.  Anticipate discharge 11/16.   Level of care: Med-Surg  Consultants:  None  Procedures:  None  Antimicrobials: None   Subjective: Seen and examined.  Resting comfortably in bed.  No visible distress.  No complaints of pain.  Reports shortness of breath improving.  Objective: Vitals:   08/30/22 1959 08/30/22 2006 08/31/22 0549 08/31/22 0829  BP: (!) 114/57  128/75 128/66  Pulse: 78  66 74  Resp: 17  18 18   Temp: 98.6 F (37 C)  98 F (36.7 C) 98.1 F (36.7 C)  TempSrc: Oral  Oral Oral  SpO2: 96% 95% 93% 93%  Weight:      Height:        Intake/Output Summary (Last 24 hours) at 08/31/2022 1110 Last data filed at 08/31/2022 1026 Gross per 24 hour  Intake 845 ml  Output --  Net 845 ml   Filed Weights   08/26/22 1211  Weight: 97.5 kg    Examination:  General exam: Appears calm and comfortable  Respiratory system: Coarse breath sounds bilaterally.  Diminished air movement.  Normal work of breathing.  2 L Cardiovascular system: S1-S2, RRR, no murmurs, no pedal edema Gastrointestinal system: Soft, NT/ND, normal bowel sounds Central nervous system: Alert and oriented. No focal neurological deficits. Extremities: Symmetric 5 x 5 power. Skin: No rashes, lesions or ulcers Psychiatry: Judgement and insight appear normal. Mood & affect appropriate.     Data Reviewed: I have personally reviewed following labs and imaging studies  CBC: Recent Labs  Lab 08/26/22 1346 08/27/22 0517 08/28/22 0508 08/30/22 0449  WBC 3.6* 2.2* 5.5 4.0  HGB 11.7* 11.1* 11.2* 11.6*  HCT  34.7* 33.1* 33.6* 35.7*  MCV 101.8* 101.5* 101.2* 102.9*  PLT 115* 108* 124* 148*   Basic Metabolic Panel: Recent Labs  Lab 08/26/22 1501 08/27/22 0517 08/28/22 0508 08/29/22 0527 08/30/22 0451 08/31/22 0324  NA  --  138 139 136 136 141  K  --  4.1 4.5 4.3 3.9 3.7  CL  --  108  109 103 103 106  CO2  --  26 27 28 28 27   GLUCOSE  --  162* 143* 138* 106* 97  BUN  --  12 17 20 20 20   CREATININE  --  1.00 1.01 1.07 1.27* 1.33*  CALCIUM  --  8.9 8.7* 9.2 9.2 9.3  MG 5.0*  --  2.5* 2.7* 2.5* 2.3   GFR: Estimated Creatinine Clearance: 67 mL/min (A) (by C-G formula based on SCr of 1.33 mg/dL (H)). Liver Function Tests: No results for input(s): "AST", "ALT", "ALKPHOS", "BILITOT", "PROT", "ALBUMIN" in the last 168 hours. No results for input(s): "LIPASE", "AMYLASE" in the last 168 hours. No results for input(s): "AMMONIA" in the last 168 hours. Coagulation Profile: No results for input(s): "INR", "PROTIME" in the last 168 hours. Cardiac Enzymes: No results for input(s): "CKTOTAL", "CKMB", "CKMBINDEX", "TROPONINI" in the last 168 hours. BNP (last 3 results) No results for input(s): "PROBNP" in the last 8760 hours. HbA1C: No results for input(s): "HGBA1C" in the last 72 hours. CBG: No results for input(s): "GLUCAP" in the last 168 hours. Lipid Profile: No results for input(s): "CHOL", "HDL", "LDLCALC", "TRIG", "CHOLHDL", "LDLDIRECT" in the last 72 hours. Thyroid Function Tests: No results for input(s): "TSH", "T4TOTAL", "FREET4", "T3FREE", "THYROIDAB" in the last 72 hours. Anemia Panel: No results for input(s): "VITAMINB12", "FOLATE", "FERRITIN", "TIBC", "IRON", "RETICCTPCT" in the last 72 hours. Sepsis Labs: Recent Labs  Lab 08/27/22 0515  PROCALCITON <0.10    Recent Results (from the past 240 hour(s))  Resp Panel by RT-PCR (Flu A&B, Covid) Anterior Nasal Swab     Status: None   Collection Time: 08/26/22 12:16 PM   Specimen: Anterior Nasal Swab  Result Value Ref Range Status   SARS Coronavirus 2 by RT PCR NEGATIVE NEGATIVE Final    Comment: (NOTE) SARS-CoV-2 target nucleic acids are NOT DETECTED.  The SARS-CoV-2 RNA is generally detectable in upper respiratory specimens during the acute phase of infection. The lowest concentration of SARS-CoV-2 viral  copies this assay can detect is 138 copies/mL. A negative result does not preclude SARS-Cov-2 infection and should not be used as the sole basis for treatment or other patient management decisions. A negative result may occur with  improper specimen collection/handling, submission of specimen other than nasopharyngeal swab, presence of viral mutation(s) within the areas targeted by this assay, and inadequate number of viral copies(<138 copies/mL). A negative result must be combined with clinical observations, patient history, and epidemiological information. The expected result is Negative.  Fact Sheet for Patients:  13/11/23  Fact Sheet for Healthcare Providers:  13/10/23  This test is no t yet approved or cleared by the [employer] FDA and  has been authorized for detection and/or diagnosis of SARS-CoV-2 by FDA under an Emergency Use Authorization (EUA). This EUA will remain  in effect (meaning this test can be used) for the duration of the COVID-19 declaration under Section 564(b)(1) of the Act, 21 U.S.C.section 360bbb-3(b)(1), unless the authorization is terminated  or revoked sooner.       Influenza A by PCR NEGATIVE NEGATIVE Final   Influenza B by PCR NEGATIVE NEGATIVE Final  Comment: (NOTE) The Xpert Xpress SARS-CoV-2/FLU/RSV plus assay is intended as an aid in the diagnosis of influenza from Nasopharyngeal swab specimens and should not be used as a sole basis for treatment. Nasal washings and aspirates are unacceptable for Xpert Xpress SARS-CoV-2/FLU/RSV testing.  Fact Sheet for Patients: [employer]com  Fact Sheet for Healthcare Providers: SeriousBroker.it  This test is not yet approved or cleared by the [place] FDA and has been authorized for detection and/or diagnosis of SARS-CoV-2 by FDA under an Emergency Use Authorization (EUA). This  EUA will remain in effect (meaning this test can be used) for the duration of the COVID-19 declaration under Section 564(b)(1) of the Act, 21 U.S.C. section 360bbb-3(b)(1), unless the authorization is terminated or revoked.  Performed at [facility], [address] [place], [place] [address]          Radiology Studies: No results found.      Scheduled Meds:  arformoterol  15 mcg Nebulization BID   ARIPiprazole  5 mg Oral Daily   atorvastatin  10 mg Oral Daily   budesonide (PULMICORT) nebulizer solution  0.25 mg Nebulization BID   dolutegravir  50 mg Oral Daily   emtricitabine-tenofovir AF  1 tablet Oral Daily   enoxaparin (LOVENOX) injection  40 mg Subcutaneous Q24H   ipratropium-albuterol  3 mL Nebulization Q6H   lamoTRIgine  200 mg Oral Daily   losartan  25 mg Oral Daily   methylPREDNISolone (SOLU-MEDROL) injection  40 mg Intravenous Daily   traZODone  100 mg Oral QHS   Continuous Infusions:   LOS: 5 days     Tresa Moore, MD [facility]   If 7PM-7AM, please contact night-coverage  08/31/2022, 11:10 AM

## 2022-08-31 NOTE — Evaluation (Signed)
Occupational Therapy Evaluation Patient Details Name: Gregory Robinson MRN: [id number] DOB: 05/04/1956 Today's Date: 08/31/2022   History of Present Illness Gregory Robinson 66 y.o. male with medical history significant for COPD, depression, HIV on [place]ia [place], chronic hepatitis C, bipolar disorder who presents to the ER for evaluation of a 5-day history of worsening shortness of breath from his baseline associated with cough productive of yellow phlegm and wheezing   Clinical Impression   Patient in bed upon arrival, agreeable to OT session. Patient reports he lives at home with a friend who can provide assistance as needed in the home. Patient reports he has 2 stairs at the side entrance of the home. At baseline, patient reports he is independent with all ADLs and IADLs and does not ambulate with any assistive devices. Patient currently functioning independent with bed mobility and transfers. Patient ambulated 228f independently in the hallway without any AD. Patient wheezing during session, educated on proper breathing technique. Patient SP02 monitored during session, upon arrival patient on 1L SP02 AT 92%. SP02 removed, 95% on RA. Patient was able to ambulate up and down a flight of stairs independently. Patient was educated on energy conservation techniques, verbalized understanding. Patient was able to demonstrate proper use of flutter valve and incentive spirometer. Pulse Ox recommended. Patient left in bed with call bell in reach, and all needs met. OT not recommended at this time. OT to complete order.      Recommendations for follow up therapy are one component of a multi-disciplinary discharge planning process, led by the attending physician.  Recommendations may be updated based on patient status, additional functional criteria and insurance authorization.   Follow Up Recommendations  No OT follow up     Assistance Recommended at Discharge PRN  Patient can return home with the following       Functional Status Assessment  Patient has had a recent decline in their functional status and demonstrates the ability to make significant improvements in function in a reasonable and predictable amount of time.  Equipment Recommendations  Other (comment) (Pulse Ox)    Recommendations for Other Services       Precautions / Restrictions Precautions Precautions: Fall Restrictions Weight Bearing Restrictions: No      Mobility Bed Mobility Overal bed mobility: Independent                  Transfers Overall transfer level: Independent Equipment used: None                      Balance Overall balance assessment: Independent                                         ADL either performed or assessed with clinical judgement   ADL Overall ADL's : Independent                                       General ADL Comments: Patient was able to donn shoes independently     Vision Patient Visual Report: No change from baseline              Pertinent Vitals/Pain Pain Assessment Pain Assessment: No/denies pain     Hand Dominance Right   Extremity/Trunk Assessment Upper Extremity Assessment Upper Extremity Assessment: Overall WFL for tasks assessed  Lower Extremity Assessment Lower Extremity Assessment: Overall WFL for tasks assessed       Communication Communication Communication: No difficulties   Cognition Arousal/Alertness: Awake/alert Behavior During Therapy: WFL for tasks assessed/performed Overall Cognitive Status: Within Functional Limits for tasks assessed                                       General Comments  good static and dynamic standing and sitting balance observed during session. no LOB observed            Home Living Family/patient expects to be discharged to:: Private residence Living Arrangements: Other relatives;Non-relatives/Friends Available Help at Discharge:  Friend(s);Available PRN/intermittently Type of Home: House Home Access: Stairs to enter [employer] of Steps: 2 at side entrance Entrance Stairs-Rails: Left Home Layout: One level     Bathroom Shower/Tub: Teacher, early years/pre: Standard                Prior Functioning/Environment Prior Level of Function : Driving;Independent/Modified Independent             Mobility Comments: ambulates without AD in the home and community. ADLs Comments: Independent with all ADLs and IADLs        OT Problem List:        OT Treatment/Interventions:      OT Goals(Current goals can be found in the care plan section) Acute Rehab OT Goals Patient Stated Goal: return home OT Goal Formulation: With patient Time For Goal Achievement: 08/31/22 Potential to Achieve Goals: Good   AM-PAC OT "6 Clicks" Daily Activity     Outcome Measure Help from another person eating meals?: None Help from another person taking care of personal grooming?: None Help from another person toileting, which includes using toliet, bedpan, or urinal?: None Help from another person bathing (including washing, rinsing, drying)?: None Help from another person to put on and taking off regular upper body clothing?: None Help from another person to put on and taking off regular lower body clothing?: None 6 Click Score: 24   End of Session Equipment Utilized During Treatment:  (none) Nurse Communication: Mobility status;Other (comment) (SP02 removed, SP02 at 95%)  Activity Tolerance: Patient tolerated treatment well Patient left: in bed;with call bell/phone within reach                   Time: 1344-1356 OT Time Calculation (min): 12 min Charges:  OT General Charges $OT Visit: 1 Visit OT Evaluation $OT Eval Low Complexity: 1 Low    [place], OTS 08/31/2022, 2:23 PM

## 2022-08-31 NOTE — Progress Notes (Signed)
PT Cancellation Note  Patient Details Name: Gregory Robinson MRN: [id number] DOB: 06-29-1956   Cancelled Treatment:    Reason Eval/Treat Not Completed: Other (comment). Orders received and chart reviewed. Per OT pt fully independent with mobility performing full flight of stairs. No acute PT needs currently identified. PT to sign off. Please re-consult if necessary.   Delphia Grates. Fairly IV, PT, DPT Physical Therapist- [facility]  [facility]  08/31/2022, 2:18 PM

## 2022-08-31 NOTE — Plan of Care (Signed)

## 2022-09-01 DIAGNOSIS — J441 Chronic obstructive pulmonary disease with (acute) exacerbation: Secondary | ICD-10-CM | POA: Diagnosis not present

## 2022-09-01 LAB — BASIC METABOLIC PANEL
Anion gap: 9 (ref 5–15)
BUN: 19 mg/dL (ref 8–23)
CO2: 27 mmol/L (ref 22–32)
Calcium: 9.4 mg/dL (ref 8.9–10.3)
Chloride: 103 mmol/L (ref 98–111)
Creatinine, Ser: 1.4 mg/dL — ABNORMAL HIGH (ref 0.61–1.24)
GFR, Estimated: 55 mL/min — ABNORMAL LOW (ref >60)
Glucose, Bld: 95 mg/dL (ref 70–99)
Potassium: 3.7 mmol/L (ref 3.5–5.1)
Sodium: 139 mmol/L (ref 135–145)

## 2022-09-01 LAB — MAGNESIUM: Magnesium: 2.5 mg/dL — ABNORMAL HIGH (ref 1.7–2.4)

## 2022-09-01 MED ORDER — ALPRAZOLAM 0.25 MG PO TABS
0.2500 mg | ORAL_TABLET | Freq: Two times a day (BID) | ORAL | Status: DC | PRN
  Administered 2022-09-01 – 2022-09-03 (×5): 0.25 mg via ORAL
  Filled 2022-09-01 (×5): qty 1

## 2022-09-01 MED ORDER — IPRATROPIUM-ALBUTEROL 0.5-2.5 (3) MG/3ML IN SOLN
3.0000 mL | RESPIRATORY_TRACT | Status: DC
  Administered 2022-09-01 – 2022-09-02 (×6): 3 mL via RESPIRATORY_TRACT
  Filled 2022-09-01 (×6): qty 3

## 2022-09-01 NOTE — Plan of Care (Signed)

## 2022-09-01 NOTE — Progress Notes (Signed)
PROGRESS NOTE    Gregory Robinson  WUJ:[id number] DOB: 1955/12/28 DOA: 08/26/2022 PCP: Patient, No Pcp Per    Brief Narrative:  66 y.o. male with medical history significant for COPD, depression, HIV on Hart, chronic hepatitis C, bipolar disorder who presents to the ER for evaluation of a 5-day history of worsening shortness of breath from his baseline associated with cough productive of yellow phlegm and wheezing.  Patient admitted, Respiratory panel, chest x-ray showed hyperinflated lungs.  Patient was started on Solu-Medrol, Rocephin, bronchodilators, IS and flutter. Procal neg therefore Abx have been stopped.  Overall he has been very slow to improve.    Assessment & Plan:   Principal Problem:   COPD with acute exacerbation (HCC) Active Problems:   Acute hypoxic respiratory failure (HCC)   HIV (human immunodeficiency virus infection) (HCC)   Depression   Essential hypertension   Hypokalemia   COPD with acute exacerbation (HCC) Acute  respiratory failure with hypoxia Wheezing persists Procalcitonin negative, antibiotics discontinued Plan: Continue scheduled DuoNebs to every 4 hours Add Brovana twice daily, Pulmicort twice daily Hold Dulera Encourage I-S and flutter use Continue daily IV steroids, wean to prednisone tomorrow with plans to further wean as outpatient PPI for stress ulcer prophylaxis  Acute hypoxic respiratory failure (HCC) Attempted wean as tolerated Goal saturation 88 to 92% Weaned to RA on 11/16 Will ambulate in AM  Hypokalemia Resolved   Essential hypertension Continue Cozaar   Depression Continue trazodone, Descovy and Abilify   HIV (human immunodeficiency virus infection) (HCC) Continue Dolutegravir     Macrocytic anemia - no Signs of bleeding; monitor   DVT prophylaxis: SQ Lovenox Code Status: Full Family Communication: Sister Dr. Kreg Shropshire [phone] on 11/15, 11/16 Disposition Plan: Status is: Inpatient Remains inpatient appropriate  because: COPD exacerbation.  Hypoxia.  Still wheezing.  Anticipate dc 11/17   Level of care: Med-Surg  Consultants:  None  Procedures:  None  Antimicrobials: None   Subjective: Seen and examined.  NAD. Endorses anxiety  Objective: Vitals:   09/01/22 0252 09/01/22 0420 09/01/22 0742 09/01/22 0800  BP:  122/64  126/65  Pulse:  77  78  Resp:  18  18  Temp:  98.2 F (36.8 C)  98 F (36.7 C)  TempSrc:    Oral  SpO2: 96% 95% (!) 89% 100%  Weight:      Height:        Intake/Output Summary (Last 24 hours) at 09/01/2022 1053 Last data filed at 09/01/2022 1024 Gross per 24 hour  Intake 360 ml  Output --  Net 360 ml   Filed Weights   08/26/22 1211  Weight: 97.5 kg    Examination:  General exam: NAD Respiratory system: Scattered wheeze, normal WOB, RA Cardiovascular system: S1-S2, RRR, no murmurs, no pedal edema Gastrointestinal system: Soft, NT/ND, normal bowel sounds Central nervous system: Alert and oriented. No focal neurological deficits. Extremities: Symmetric 5 x 5 power. Skin: No rashes, lesions or ulcers Psychiatry: Judgement and insight appear normal. Mood & affect anxious.     Data Reviewed: I have personally reviewed following labs and imaging studies  CBC: Recent Labs  Lab 08/26/22 1346 08/27/22 0517 08/28/22 0508 08/30/22 0449  WBC 3.6* 2.2* 5.5 4.0  HGB 11.7* 11.1* 11.2* 11.6*  HCT 34.7* 33.1* 33.6* 35.7*  MCV 101.8* 101.5* 101.2* 102.9*  PLT 115* 108* 124* 148*   Basic Metabolic Panel: Recent Labs  Lab 08/28/22 0508 08/29/22 0527 08/30/22 0451 08/31/22 0324 09/01/22 0344  NA 139 136 136  141 139  K 4.5 4.3 3.9 3.7 3.7  CL 109 103 103 106 103  CO2 27 28 28 27 27   GLUCOSE 143* 138* 106* 97 95  BUN 17 20 20 20 19   CREATININE 1.01 1.07 1.27* 1.33* 1.40*  CALCIUM 8.7* 9.2 9.2 9.3 9.4  MG 2.5* 2.7* 2.5* 2.3 2.5*   GFR: Estimated Creatinine Clearance: 63.6 mL/min (A) (by C-G formula based on SCr of 1.4 mg/dL (H)). Liver Function  Tests: No results for input(s): "AST", "ALT", "ALKPHOS", "BILITOT", "PROT", "ALBUMIN" in the last 168 hours. No results for input(s): "LIPASE", "AMYLASE" in the last 168 hours. No results for input(s): "AMMONIA" in the last 168 hours. Coagulation Profile: No results for input(s): "INR", "PROTIME" in the last 168 hours. Cardiac Enzymes: No results for input(s): "CKTOTAL", "CKMB", "CKMBINDEX", "TROPONINI" in the last 168 hours. BNP (last 3 results) No results for input(s): "PROBNP" in the last 8760 hours. HbA1C: No results for input(s): "HGBA1C" in the last 72 hours. CBG: No results for input(s): "GLUCAP" in the last 168 hours. Lipid Profile: No results for input(s): "CHOL", "HDL", "LDLCALC", "TRIG", "CHOLHDL", "LDLDIRECT" in the last 72 hours. Thyroid Function Tests: No results for input(s): "TSH", "T4TOTAL", "FREET4", "T3FREE", "THYROIDAB" in the last 72 hours. Anemia Panel: No results for input(s): "VITAMINB12", "FOLATE", "FERRITIN", "TIBC", "IRON", "RETICCTPCT" in the last 72 hours. Sepsis Labs: Recent Labs  Lab 08/27/22 0515  PROCALCITON <0.10    Recent Results (from the past 240 hour(s))  Resp Panel by RT-PCR (Flu A&B, Covid) Anterior Nasal Swab     Status: None   Collection Time: 08/26/22 12:16 PM   Specimen: Anterior Nasal Swab  Result Value Ref Range Status   SARS Coronavirus 2 by RT PCR NEGATIVE NEGATIVE Final    Comment: (NOTE) SARS-CoV-2 target nucleic acids are NOT DETECTED.  The SARS-CoV-2 RNA is generally detectable in upper respiratory specimens during the acute phase of infection. The lowest concentration of SARS-CoV-2 viral copies this assay can detect is 138 copies/mL. A negative result does not preclude SARS-Cov-2 infection and should not be used as the sole basis for treatment or other patient management decisions. A negative result may occur with  improper specimen collection/handling, submission of specimen other than nasopharyngeal swab, presence of  viral mutation(s) within the areas targeted by this assay, and inadequate number of viral copies(<138 copies/mL). A negative result must be combined with clinical observations, patient history, and epidemiological information. The expected result is Negative.  Fact Sheet for Patients:  13/11/23  Fact Sheet for Healthcare Providers:  13/10/23  This test is no t yet approved or cleared by the [employer] FDA and  has been authorized for detection and/or diagnosis of SARS-CoV-2 by FDA under an Emergency Use Authorization (EUA). This EUA will remain  in effect (meaning this test can be used) for the duration of the COVID-19 declaration under Section 564(b)(1) of the Act, 21 U.S.C.section 360bbb-3(b)(1), unless the authorization is terminated  or revoked sooner.       Influenza A by PCR NEGATIVE NEGATIVE Final   Influenza B by PCR NEGATIVE NEGATIVE Final    Comment: (NOTE) The Xpert Xpress SARS-CoV-2/FLU/RSV plus assay is intended as an aid in the diagnosis of influenza from Nasopharyngeal swab specimens and should not be used as a sole basis for treatment. Nasal washings and aspirates are unacceptable for Xpert Xpress SARS-CoV-2/FLU/RSV testing.  Fact Sheet for Patients: SeriousBroker.it  Fact Sheet for Healthcare Providers: [place]  This test is not yet approved or cleared  by the [place] and has been authorized for detection and/or diagnosis of SARS-CoV-2 by FDA under an Emergency Use Authorization (EUA). This EUA will remain in effect (meaning this test can be used) for the duration of the COVID-19 declaration under Section 564(b)(1) of the Act, 21 U.S.C. section 360bbb-3(b)(1), unless the authorization is terminated or revoked.  Performed at [facility], [address] [place], [place] [address]          Radiology  Studies: No results found.      Scheduled Meds:  arformoterol  15 mcg Nebulization BID   ARIPiprazole  5 mg Oral Daily   atorvastatin  10 mg Oral Daily   budesonide (PULMICORT) nebulizer solution  0.25 mg Nebulization BID   dolutegravir  50 mg Oral Daily   emtricitabine-tenofovir AF  1 tablet Oral Daily   enoxaparin (LOVENOX) injection  40 mg Subcutaneous Q24H   ipratropium-albuterol  3 mL Nebulization Q4H   lamoTRIgine  200 mg Oral Daily   losartan  25 mg Oral Daily   methylPREDNISolone (SOLU-MEDROL) injection  40 mg Intravenous Daily   pantoprazole  40 mg Oral Daily   traZODone  100 mg Oral QHS   Continuous Infusions:   LOS: 6 days     Tresa Moore, MD [facility]   If 7PM-7AM, please contact night-coverage  09/01/2022, 10:53 AM

## 2022-09-01 NOTE — Progress Notes (Signed)
Mobility Specialist - Progress Note   09/01/22 1304  Mobility  Activity Ambulated independently in hallway;Stood at bedside;Dangled on edge of bed  Level of Assistance Independent  Assistive Device None  Distance Ambulated (ft) 240 ft  Activity Response Tolerated well  Mobility Referral Yes  $Mobility charge 1 Mobility   Pt supine in bed on RA upon arrival. Pt STS and ambulates in hallway indep. Pt returns to EOB with needs in reach.   During mobility: SpO2 89  Post-mobility: SPO2 95   [facility]  Mobility Specialist  09/01/22 1:06 PM

## 2022-09-02 DIAGNOSIS — J441 Chronic obstructive pulmonary disease with (acute) exacerbation: Secondary | ICD-10-CM | POA: Diagnosis not present

## 2022-09-02 LAB — CBC
HCT: 38.2 % — ABNORMAL LOW (ref 39.0–52.0)
Hemoglobin: 12.8 g/dL — ABNORMAL LOW (ref 13.0–17.0)
MCH: 33.8 pg (ref 26.0–34.0)
MCHC: 33.5 g/dL (ref 30.0–36.0)
MCV: 100.8 fL — ABNORMAL HIGH (ref 80.0–100.0)
Platelets: 138 10*3/uL — ABNORMAL LOW (ref 150–400)
RBC: 3.79 MIL/uL — ABNORMAL LOW (ref 4.22–5.81)
RDW: 13.2 % (ref 11.5–15.5)
WBC: 5.4 10*3/uL (ref 4.0–10.5)
nRBC: 0 % (ref 0.0–0.2)

## 2022-09-02 LAB — BASIC METABOLIC PANEL
Anion gap: 7 (ref 5–15)
BUN: 17 mg/dL (ref 8–23)
CO2: 27 mmol/L (ref 22–32)
Calcium: 9.1 mg/dL (ref 8.9–10.3)
Chloride: 104 mmol/L (ref 98–111)
Creatinine, Ser: 1.24 mg/dL (ref 0.61–1.24)
GFR, Estimated: >60 mL/min (ref >60)
Glucose, Bld: 92 mg/dL (ref 70–99)
Potassium: 3.4 mmol/L — ABNORMAL LOW (ref 3.5–5.1)
Sodium: 138 mmol/L (ref 135–145)

## 2022-09-02 LAB — MAGNESIUM: Magnesium: 2.5 mg/dL — ABNORMAL HIGH (ref 1.7–2.4)

## 2022-09-02 MED ORDER — IPRATROPIUM-ALBUTEROL 0.5-2.5 (3) MG/3ML IN SOLN
3.0000 mL | Freq: Four times a day (QID) | RESPIRATORY_TRACT | Status: DC
  Administered 2022-09-02 – 2022-09-03 (×3): 3 mL via RESPIRATORY_TRACT
  Filled 2022-09-02 (×3): qty 3

## 2022-09-02 MED ORDER — PREDNISONE 50 MG PO TABS
50.0000 mg | ORAL_TABLET | Freq: Every day | ORAL | Status: DC
  Administered 2022-09-02 – 2022-09-03 (×2): 50 mg via ORAL
  Filled 2022-09-02 (×2): qty 1

## 2022-09-02 NOTE — Care Management Important Message (Signed)
Important Message  Patient Details  Name: Gregory Robinson MRN: [id number] Date of Birth: 12-22-1955   Medicare Important Message Given:  Yes     [name] 09/02/2022, 1:15 PM

## 2022-09-02 NOTE — Progress Notes (Signed)
PROGRESS NOTE    Gregory Robinson  [id number]:[id number] DOB: 02/16/1956 DOA: 08/26/2022 PCP: Patient, No Pcp Per    Brief Narrative:  66 y.o. male with medical history significant for COPD, depression, HIV on Hart, chronic hepatitis C, bipolar disorder who presents to the ER for evaluation of a 5-day history of worsening shortness of breath from his baseline associated with cough productive of yellow phlegm and wheezing.  Patient admitted, Respiratory panel, chest x-ray showed hyperinflated lungs.  Patient was started on Solu-Medrol, Rocephin, bronchodilators, IS and flutter. Procal neg therefore Abx have been stopped.  Overall he has been very slow to improve.    Assessment & Plan:   Principal Problem:   COPD with acute exacerbation (HCC) Active Problems:   Acute hypoxic respiratory failure (HCC)   HIV (human immunodeficiency virus infection) (HCC)   Depression   Essential hypertension   Hypokalemia   COPD with acute exacerbation (HCC) Acute  respiratory failure with hypoxia Wheezing persists, but improved Procalcitonin negative, antibiotics discontinued Plan: Continue scheduled DuoNebs to every 4 hours Add Brovana twice daily, Pulmicort twice daily Hold Dulera Encourage I-S and flutter use DC IV solumedrol Start PO prednisone 50mg  daily, consider 5 day course at this dose vs taper PPI for stress ulcer prophylaxis  Acute hypoxic respiratory failure (HCC) Attempted wean as tolerated Goal saturation 88 to 92% Weaned to RA on 11/16 Placed back on oxygen overnight Per RN, patient takes off his oxygen sometimes Ambulatory desat test prior to discharge  Hypokalemia Resolved   Essential hypertension Continue Cozaar   Depression Continue trazodone, Descovy and Abilify   HIV (human immunodeficiency virus infection) (HCC) Continue Dolutegravir     Macrocytic anemia - no Signs of bleeding; monitor   DVT prophylaxis: SQ Lovenox Code Status: Full Family Communication: Sister Dr.  12/16 (IM, palliative care) [phone] on 11/15, 11/16, 11/17 Disposition Plan: Status is: Inpatient Remains inpatient appropriate because: COPD exacerbation.  Hypoxia.  Still wheezing.  Anticipate dc 11/17   Level of care: Med-Surg  Consultants:  None  Procedures:  None  Antimicrobials: None   Subjective: Seen and examined.  NAD. Feeling better  Objective: Vitals:   09/02/22 0038 09/02/22 0436 09/02/22 0730 09/02/22 0818  BP:  118/64 127/71   Pulse:  75 72   Resp:  20 18   Temp:  98.2 F (36.8 C) 97.8 F (36.6 C)   TempSrc:   Oral   SpO2: (!) 86% 95% 94% 95%  Weight:      Height:        Intake/Output Summary (Last 24 hours) at 09/02/2022 1117 Last data filed at 09/02/2022 1045 Gross per 24 hour  Intake 720 ml  Output --  Net 720 ml   Filed Weights   08/26/22 1211  Weight: 97.5 kg    Examination:  General exam: No acute distress Respiratory system: Bibasilar wheeze, normal WOB, 2L Cardiovascular system: S1-S2, RRR, no murmurs, no pedal edema Gastrointestinal system: Soft, NT/ND, normal bowel sounds Central nervous system: Alert and oriented. No focal neurological deficits. Extremities: Symmetric 5 x 5 power. Skin: No rashes, lesions or ulcers Psychiatry: Judgement and insight appear normal. Mood & affect anxious.     Data Reviewed: I have personally reviewed following labs and imaging studies  CBC: Recent Labs  Lab 08/26/22 1346 08/27/22 0517 08/28/22 0508 08/30/22 0449 09/02/22 0642  WBC 3.6* 2.2* 5.5 4.0 5.4  HGB 11.7* 11.1* 11.2* 11.6* 12.8*  HCT 34.7* 33.1* 33.6* 35.7* 38.2*  MCV 101.8* 101.5* 101.2* 102.9*  100.8*  PLT 115* 108* 124* 148* 138*   Basic Metabolic Panel: Recent Labs  Lab 08/29/22 0527 08/30/22 0451 08/31/22 0324 09/01/22 0344 09/02/22 0642  NA 136 136 141 139 138  K 4.3 3.9 3.7 3.7 3.4*  CL 103 103 106 103 104  CO2 28 28 27 27 27   GLUCOSE 138* 106* 97 95 92  BUN 20 20 20 19 17   CREATININE 1.07 1.27*  1.33* 1.40* 1.24  CALCIUM 9.2 9.2 9.3 9.4 9.1  MG 2.7* 2.5* 2.3 2.5* 2.5*   GFR: Estimated Creatinine Clearance: 71.9 mL/min (by C-G formula based on SCr of 1.24 mg/dL). Liver Function Tests: No results for input(s): "AST", "ALT", "ALKPHOS", "BILITOT", "PROT", "ALBUMIN" in the last 168 hours. No results for input(s): "LIPASE", "AMYLASE" in the last 168 hours. No results for input(s): "AMMONIA" in the last 168 hours. Coagulation Profile: No results for input(s): "INR", "PROTIME" in the last 168 hours. Cardiac Enzymes: No results for input(s): "CKTOTAL", "CKMB", "CKMBINDEX", "TROPONINI" in the last 168 hours. BNP (last 3 results) No results for input(s): "PROBNP" in the last 8760 hours. HbA1C: No results for input(s): "HGBA1C" in the last 72 hours. CBG: No results for input(s): "GLUCAP" in the last 168 hours. Lipid Profile: No results for input(s): "CHOL", "HDL", "LDLCALC", "TRIG", "CHOLHDL", "LDLDIRECT" in the last 72 hours. Thyroid Function Tests: No results for input(s): "TSH", "T4TOTAL", "FREET4", "T3FREE", "THYROIDAB" in the last 72 hours. Anemia Panel: No results for input(s): "VITAMINB12", "FOLATE", "FERRITIN", "TIBC", "IRON", "RETICCTPCT" in the last 72 hours. Sepsis Labs: Recent Labs  Lab 08/27/22 0515  PROCALCITON <0.10    Recent Results (from the past 240 hour(s))  Resp Panel by RT-PCR (Flu A&B, Covid) Anterior Nasal Swab     Status: None   Collection Time: 08/26/22 12:16 PM   Specimen: Anterior Nasal Swab  Result Value Ref Range Status   SARS Coronavirus 2 by RT PCR NEGATIVE NEGATIVE Final    Comment: (NOTE) SARS-CoV-2 target nucleic acids are NOT DETECTED.  The SARS-CoV-2 RNA is generally detectable in upper respiratory specimens during the acute phase of infection. The lowest concentration of SARS-CoV-2 viral copies this assay can detect is 138 copies/mL. A negative result does not preclude SARS-Cov-2 infection and should not be used as the sole basis for  treatment or other patient management decisions. A negative result may occur with  improper specimen collection/handling, submission of specimen other than nasopharyngeal swab, presence of viral mutation(s) within the areas targeted by this assay, and inadequate number of viral copies(<138 copies/mL). A negative result must be combined with clinical observations, patient history, and epidemiological information. The expected result is Negative.  Fact Sheet for Patients:  13/11/23  Fact Sheet for Healthcare Providers:  13/10/23  This test is no t yet approved or cleared by the [employer] FDA and  has been authorized for detection and/or diagnosis of SARS-CoV-2 by FDA under an Emergency Use Authorization (EUA). This EUA will remain  in effect (meaning this test can be used) for the duration of the COVID-19 declaration under Section 564(b)(1) of the Act, 21 U.S.C.section 360bbb-3(b)(1), unless the authorization is terminated  or revoked sooner.       Influenza A by PCR NEGATIVE NEGATIVE Final   Influenza B by PCR NEGATIVE NEGATIVE Final    Comment: (NOTE) The Xpert Xpress SARS-CoV-2/FLU/RSV plus assay is intended as an aid in the diagnosis of influenza from Nasopharyngeal swab specimens and should not be used as a sole basis for treatment. Nasal washings and  aspirates are unacceptable for Xpert Xpress SARS-CoV-2/FLU/RSV testing.  Fact Sheet for Patients: [employer].com  Fact Sheet for Healthcare Providers: [employer]ker.it  This test is not yet approved or cleared by the [place] FDA and has been authorized for detection and/or diagnosis of SARS-CoV-2 by FDA under an Emergency Use Authorization (EUA). This EUA will remain in effect (meaning this test can be used) for the duration of the COVID-19 declaration under Section 564(b)(1) of the Act, 21  U.S.C. section 360bbb-3(b)(1), unless the authorization is terminated or revoked.  Performed at [facility], [address], [place], [place] [address]          Radiology Studies: No results found.      Scheduled Meds:  arformoterol  15 mcg Nebulization BID   ARIPiprazole  5 mg Oral Daily   atorvastatin  10 mg Oral Daily   budesonide (PULMICORT) nebulizer solution  0.25 mg Nebulization BID   dolutegravir  50 mg Oral Daily   emtricitabine-tenofovir AF  1 tablet Oral Daily   enoxaparin (LOVENOX) injection  40 mg Subcutaneous Q24H   ipratropium-albuterol  3 mL Nebulization Q4H   lamoTRIgine  200 mg Oral Daily   losartan  25 mg Oral Daily   pantoprazole  40 mg Oral Daily   predniSONE  50 mg Oral Q breakfast   traZODone  100 mg Oral QHS   Continuous Infusions:   LOS: 7 days     Tresa Moore, MD [facility]   If 7PM-7AM, please contact night-coverage  09/02/2022, 11:17 AM

## 2022-09-02 NOTE — Progress Notes (Signed)
Mobility Specialist - Progress Note  During mobility: SpO2 (90) Post-mobility:SPO2 (92)     09/02/22 0929  Mobility  Activity Ambulated independently in hallway;Stood at bedside  Level of Assistance Independent  Assistive Device None  Distance Ambulated (ft) 460 ft  Activity Response Tolerated well  Mobility Referral Yes  $Mobility charge 1 Mobility   Pt was awake in bed sitting on 2L upon entry. Pt STS and ambulates indep w/ out SpO2. Pt had SOB and took 2-3 minute seated rest break before continued ambulation. Pt returned to EOB and resumed using 2L SpO2. Pt left with needs in reach.   [name] Mobility Specialist 09/02/22, 9:37 AM

## 2022-09-02 NOTE — Plan of Care (Signed)

## 2022-09-03 DIAGNOSIS — Z21 Asymptomatic human immunodeficiency virus [HIV] infection status: Secondary | ICD-10-CM | POA: Diagnosis not present

## 2022-09-03 DIAGNOSIS — J9621 Acute and chronic respiratory failure with hypoxia: Secondary | ICD-10-CM | POA: Diagnosis not present

## 2022-09-03 DIAGNOSIS — I10 Essential (primary) hypertension: Secondary | ICD-10-CM

## 2022-09-03 DIAGNOSIS — J441 Chronic obstructive pulmonary disease with (acute) exacerbation: Secondary | ICD-10-CM | POA: Diagnosis not present

## 2022-09-03 LAB — MAGNESIUM: Magnesium: 2.5 mg/dL — ABNORMAL HIGH (ref 1.7–2.4)

## 2022-09-03 LAB — BASIC METABOLIC PANEL
Anion gap: 8 (ref 5–15)
BUN: 19 mg/dL (ref 8–23)
CO2: 25 mmol/L (ref 22–32)
Calcium: 9.3 mg/dL (ref 8.9–10.3)
Chloride: 105 mmol/L (ref 98–111)
Creatinine, Ser: 1.26 mg/dL — ABNORMAL HIGH (ref 0.61–1.24)
GFR, Estimated: >60 mL/min (ref >60)
Glucose, Bld: 95 mg/dL (ref 70–99)
Potassium: 3.6 mmol/L (ref 3.5–5.1)
Sodium: 138 mmol/L (ref 135–145)

## 2022-09-03 MED ORDER — PREDNISONE 10 MG PO TABS: ORAL_TABLET | ORAL | 0 refills | Status: AC

## 2022-09-03 NOTE — Progress Notes (Signed)
O2 saturation on room air at rest 94, pulse 81 O2 saturation on room air ambulating in room 92. Pulse104 Tolerated well.

## 2022-09-03 NOTE — Progress Notes (Signed)
Waiting ride for discharge to home

## 2022-09-03 NOTE — Discharge Summary (Signed)
Physician Discharge Summary   Patient: Gregory Robinson MRN: [id number] DOB: 09-03-1956  Admit date:     08/26/2022  Discharge date: 09/03/22  Discharge Physician: Hollice Espy   PCP: Patient, No Pcp Per   Recommendations at discharge:   Patient given referral for here in town New medication: Prednisone taper  Discharge Diagnoses: Principal Problem:   COPD with acute exacerbation (HCC) Active Problems:   Acute hypoxic respiratory failure (HCC)   HIV (human immunodeficiency virus infection) (HCC)   Depression   Essential hypertension   Hypokalemia  Resolved Problems:   * No resolved hospital problems. *  Hospital Course: 66 y.o. male with medical history significant for COPD, depression, HIV on Edwyna Shell, chronic hepatitis C, bipolar disorder who presented to the ER on 11/10 for evaluation of a 5-day history of worsening shortness of breath from his baseline associated with cough productive of yellow phlegm and wheezing.  Patient admitted for COPD exacerbation, Respiratory panel, chest x-ray showed hyperinflated lungs.  Patient was started on Solu-Medrol, Rocephin, bronchodilators, IS and flutter. Procal neg therefore Abx have been stopped.    Assessment and Plan: Principal Problem:   COPD with acute exacerbation (HCC) Active Problems:   Acute hypoxic respiratory failure (HCC)   HIV (human immunodeficiency virus infection) (HCC)   Depression   Essential hypertension   Hypokalemia    COPD with acute exacerbation [facility]): Resolved Acute  respiratory failure with hypoxia: Resolved Continued on DuoNebs, supplemental oxygen, steroids.  By 11/17, down to room air although still with significant wheezing and decreased oxygen saturations with ambulation.  11/18, able to ambulate and keep oxygen saturations above 90%.  Discharged on prednisone taper.  Advised to not smoke at least for the next week although advised to quit smoking altogether.  Given referral for local pulmonologist as patient  has pulmonologist at [facility], but has not seen him over a year.     Hypokalemia Resolved   Essential hypertension Continue Cozaar   Depression Continue trazodone, Descovy and Abilify   HIV (human immunodeficiency virus infection) (HCC) Continue Dolutegravir     Macrocytic anemia - no Signs of bleeding; monitor       Consultants: None Procedures performed: None Disposition: Home Diet recommendation:  Discharge Diet Orders (From admission, onward)     Start     Ordered   09/03/22 0000  Diet - low sodium heart healthy        09/03/22 1345           Cardiac diet DISCHARGE MEDICATION: Allergies as of 09/03/2022       Reactions   Sulfa Antibiotics Rash   Unknown        Medication List     STOP taking these medications    mometasone-formoterol 100-5 MCG/ACT Aero Commonly known as: DULERA       TAKE these medications    albuterol 108 (90 Base) MCG/ACT inhaler Commonly known as: VENTOLIN HFA Inhale 2 puffs into the lungs every 4 (four) hours as needed for shortness of breath or wheezing.   ARIPiprazole 5 MG tablet Commonly known as: ABILIFY Take 1 tablet by mouth daily.   atorvastatin 10 MG tablet Commonly known as: LIPITOR Take by mouth.   Descovy 200-25 MG tablet Generic drug: emtricitabine-tenofovir AF Take 1 tablet by mouth daily.   lamoTRIgine 200 MG tablet Commonly known as: LAMICTAL Take 1 tablet by mouth daily.   losartan 25 MG tablet Commonly known as: COZAAR Take 1 tablet by mouth daily.   predniSONE 10  MG tablet Commonly known as: DELTASONE Take 4 tablets (40 mg total) by mouth daily with breakfast for 1 day, THEN 3 tablets (30 mg total) daily with breakfast for 1 day, THEN 2 tablets (20 mg total) daily with breakfast for 1 day, THEN 1 tablet (10 mg total) daily with breakfast for 1 day. Start taking on: September 03, 2022 What changed: See the new instructions.   Stiolto Respimat 2.5-2.5 MCG/ACT Aers Generic drug: Tiotropium  Bromide-Olodaterol Inhale into the lungs.   Tivicay 50 MG tablet Generic drug: dolutegravir Take 1 tablet by mouth daily.   traZODone 100 MG tablet Commonly known as: DESYREL Take by mouth.        Follow-up Information     Vida Rigger, MD. Schedule an appointment as soon as possible for a visit in 1 month(s).   Specialty: Pulmonary Disease Why: Establish with new lung doctor here in town Contact information: [address] [place] [address] [phone]                Discharge Exam: Ceasar Mons Weights   08/26/22 1211  Weight: 97.5 kg   General: Alert and oriented x3 Cardiovascular: Regular rate and rhythm, S1-S2 Lungs: Decreased breath sounds throughout, no wheezing  Condition at discharge: improving  The results of significant diagnostics from this hospitalization (including imaging, microbiology, ancillary and laboratory) are listed below for reference.   Imaging Studies: DG Chest 2 View  Result Date: 08/26/2022 CLINICAL DATA:  Shortness of breath, ongoing congestion, cough, and chest pain for 4 days, history COPD/emphysema EXAM: CHEST - 2 VIEW COMPARISON:  10/13/2019 FINDINGS: Normal heart size, mediastinal contours, and pulmonary vascularity. Atherosclerotic calcification aorta. Calcified granuloma RIGHT upper lobe. Emphysematous and bronchitic changes consistent with COPD. No pulmonary infiltrate, pleural effusion, or pneumothorax. Diffuse osseous demineralization. IMPRESSION: COPD changes without acute abnormalities. Aortic Atherosclerosis (ICD10-I70.0) and Emphysema (ICD10-J43.9). Electronically Signed   By: Ulyses Southward M.D.   On: 08/26/2022 12:35    Microbiology: Results for orders placed or performed during the hospital encounter of 08/26/22  Resp Panel by RT-PCR (Flu A&B, Covid) Anterior Nasal Swab     Status: None   Collection Time: 08/26/22 12:16 PM   Specimen: Anterior Nasal Swab  Result Value Ref Range Status   SARS Coronavirus 2 by RT  PCR NEGATIVE NEGATIVE Final    Comment: (NOTE) SARS-CoV-2 target nucleic acids are NOT DETECTED.  The SARS-CoV-2 RNA is generally detectable in upper respiratory specimens during the acute phase of infection. The lowest concentration of SARS-CoV-2 viral copies this assay can detect is 138 copies/mL. A negative result does not preclude SARS-Cov-2 infection and should not be used as the sole basis for treatment or other patient management decisions. A negative result may occur with  improper specimen collection/handling, submission of specimen other than nasopharyngeal swab, presence of viral mutation(s) within the areas targeted by this assay, and inadequate number of viral copies(<138 copies/mL). A negative result must be combined with clinical observations, patient history, and epidemiological information. The expected result is Negative.  Fact Sheet for Patients:  BloggerCourse.com  Fact Sheet for Healthcare Providers:  SeriousBroker.it  This test is no t yet approved or cleared by the [place] FDA and  has been authorized for detection and/or diagnosis of SARS-CoV-2 by FDA under an Emergency Use Authorization (EUA). This EUA will remain  in effect (meaning this test can be used) for the duration of the COVID-19 declaration under Section 564(b)(1) of the Act, 21 U.S.C.section 360bbb-3(b)(1), unless the authorization  is terminated  or revoked sooner.       Influenza A by PCR NEGATIVE NEGATIVE Final   Influenza B by PCR NEGATIVE NEGATIVE Final    Comment: (NOTE) The Xpert Xpress SARS-CoV-2/FLU/RSV plus assay is intended as an aid in the diagnosis of influenza from Nasopharyngeal swab specimens and should not be used as a sole basis for treatment. Nasal washings and aspirates are unacceptable for Xpert Xpress SARS-CoV-2/FLU/RSV testing.  Fact Sheet for Patients: BloggerCourse.com  Fact Sheet for  Healthcare Providers: SeriousBroker.it  This test is not yet approved or cleared by the [place] FDA and has been authorized for detection and/or diagnosis of SARS-CoV-2 by FDA under an Emergency Use Authorization (EUA). This EUA will remain in effect (meaning this test can be used) for the duration of the COVID-19 declaration under Section 564(b)(1) of the Act, 21 U.S.C. section 360bbb-3(b)(1), unless the authorization is terminated or revoked.  Performed at [facility], [address] [place], [place] [address]     Labs: CBC: Recent Labs  Lab 08/28/22 [phone] 08/30/22 0449 09/02/22 0642  WBC 5.5 4.0 5.4  HGB 11.2* 11.6* 12.8*  HCT 33.6* 35.7* 38.2*  MCV 101.2* 102.9* 100.8*  PLT 124* 148* 138*   Basic Metabolic Panel: Recent Labs  Lab 08/30/22 0451 08/31/22 0324 09/01/22 0344 09/02/22 0642 09/03/22 0601  NA 136 141 139 138 138  K 3.9 3.7 3.7 3.4* 3.6  CL 103 106 103 104 105  CO2 28 27 27 27 25   GLUCOSE 106* 97 95 92 95  BUN 20 20 19 17 19   CREATININE 1.27* 1.33* 1.40* 1.24 1.26*  CALCIUM 9.2 9.3 9.4 9.1 9.3  MG 2.5* 2.3 2.5* 2.5* 2.5*   Liver Function Tests: No results for input(s): "AST", "ALT", "ALKPHOS", "BILITOT", "PROT", "ALBUMIN" in the last 168 hours. CBG: No results for input(s): "GLUCAP" in the last 168 hours.  Discharge time spent: less than 30 minutes.  Signed: , MD [facility] 09/03/2022

## 2022-09-03 NOTE — Progress Notes (Signed)
Mobility Specialist - Progress Note   Pre-mobility:SpO2 (95) During mobility:SpO2 (90) Post-mobility: SPO2 (92)     09/03/22 1337  Mobility  Activity Ambulated independently in hallway;Stood at bedside  Level of Assistance Independent  Assistive Device None  Distance Ambulated (ft) 400 ft  Activity Response Tolerated well  Mobility Referral Yes  $Mobility charge 1 Mobility   Pt resting in bed on RA upon entry. Pt STS and ambulates in hallway indep. Pt had some SOB during first lap and took x1 seated rest break for 1 minute; SpO2 returned to 93 after rest break. Pt returned to bed and left with needs in reach.   [name] Mobility Specialist 09/03/22, 1:50 PM

## 2022-09-13 NOTE — Unmapped (Signed)
[facility] Refill Coordination Note    Specialty Medication(s) to be Shipped:   Infectious Disease: Descovy and Tivicay    Other medication(s) to be shipped: ipratropium ns  Lamotrigine  stiolto       Andrew Oconnell, DOB: Nov 08, 1955  Phone: [phone] (home)       All above HIPAA information was verified with patient.     Was a Nurse, learning disability used for this call? No    Completed refill call assessment today to schedule patient's medication shipment from the [facility] [phone]).  All relevant notes have been reviewed.     Specialty medication(s) and dose(s) confirmed: Regimen is correct and unchanged.   Changes to medications: [name] reports no changes at this time.  Changes to insurance: No  New side effects reported not previously addressed with a pharmacist or physician: None reported  Questions for the pharmacist: No    Confirmed patient received a Conservation officer, historic buildings and a Surveyor, mining with first shipment. The patient will receive a drug information handout for each medication shipped and additional FDA Medication Guides as required.       DISEASE/MEDICATION-SPECIFIC INFORMATION        N/A    SPECIALTY MEDICATION ADHERENCE     Medication Adherence    Patient reported X missed doses in the last month: 0  Specialty Medication: descovy 200-25mg   Patient is on additional specialty medications: Yes  Additional Specialty Medications: Tivicay 50mg   Patient Reported Additional Medication X Missed Doses in the Last Month: 0                                Were doses missed due to medication being on hold? No    tivicay 50mg   : 9 days of medicine on hand   descovy 200-25mg   : 9 days of medicine on hand       REFERRAL TO PHARMACIST     Referral to the pharmacist: Not needed      [facility]     Shipping address confirmed in Epic.     Delivery Scheduled: Yes, Expected medication delivery date: 09/19/22.     Medication will be delivered via UPS to the prescription address in Epic WAM.    Andrew Oconnell   [facility] Specialty Technician

## 2022-09-16 MED FILL — STIOLTO RESPIMAT 2.5 MCG-2.5 MCG/ACTUATION SOLUTION FOR INHALATION: RESPIRATORY_TRACT | 30 days supply | Qty: 4 | Fill #6

## 2022-09-16 MED FILL — TIVICAY 50 MG TABLET: ORAL | 30 days supply | Qty: 30 | Fill #6

## 2022-09-16 MED FILL — LAMOTRIGINE 200 MG TABLET: ORAL | 30 days supply | Qty: 30 | Fill #3

## 2022-09-16 MED FILL — DESCOVY 200 MG-25 MG TABLET: ORAL | 30 days supply | Qty: 30 | Fill #6

## 2022-09-26 DIAGNOSIS — Z21 Asymptomatic human immunodeficiency virus [HIV] infection status: Secondary | ICD-10-CM | POA: Diagnosis not present

## 2022-09-26 DIAGNOSIS — E782 Mixed hyperlipidemia: Secondary | ICD-10-CM | POA: Diagnosis not present

## 2022-09-26 DIAGNOSIS — N189 Chronic kidney disease, unspecified: Secondary | ICD-10-CM | POA: Diagnosis not present

## 2022-09-26 DIAGNOSIS — Z0000 Encounter for general adult medical examination without abnormal findings: Secondary | ICD-10-CM | POA: Diagnosis not present

## 2022-09-26 DIAGNOSIS — Z23 Encounter for immunization: Secondary | ICD-10-CM | POA: Diagnosis not present

## 2022-09-26 DIAGNOSIS — Z1389 Encounter for screening for other disorder: Secondary | ICD-10-CM | POA: Diagnosis not present

## 2022-10-05 DIAGNOSIS — N189 Chronic kidney disease, unspecified: Principal | ICD-10-CM

## 2022-10-12 NOTE — Unmapped (Signed)
Name: RUSSELL HOUSMAN  Date: 10/12/2022  Address: [address] [place] [address]   [place] of Residence:  [place]  Phone: [phone]     Started assessment with patient options: over the phone     Is this the same address for mailing? Yes  If No, Mailing Address is:     Housing Status  [employer]  Medicare Part C    Tax Press photographer Status  Single    Employment Status  Retired    Corporate treasurer (Retirement/Survivor's/Disability)    If no or low income, how are you meeting your basic needs?  Not Applicable    List Tax Household Members including relationship to you:   N/A    Someone in my household receives: Not Applicable (for household members)  Specify who: N/A    Medication Access/Barriers: None    Do you have a current diagnosis for Hepatitis C?  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  Yes    Teacher, adult education  Patient has affordable insurance through [employer], [place], and or Employment and is not eligible.    Patient given ACA education if they qualified based on answers to questions above.     MyChart  Do you have an active MyChart account? Yes     If MyChart is not set up, informed patient on how to set up MyChart N/A    Patient was informed of the following programs;   N/A    The following applications/handouts were given to patient:   N/A    The following forms were also started with the patient:   N/A    Ayiden Casillas application status: Incomplete; patient needs to send proof of income (SSI Letter)    Patient is applying for [employer] on Charges Only     Additional Comments: Will bring in proof of income to next appt.      Mickle Asper,  Benefits & Eligibility Coordinator  Time of Intervention: 5 minutes

## 2022-10-14 DIAGNOSIS — F32A Depression, unspecified depression type: Principal | ICD-10-CM

## 2022-10-14 MED ORDER — LAMOTRIGINE 200 MG TABLET
ORAL_TABLET | Freq: Every day | ORAL | 3 refills | 30.00000 days | Status: CN
Start: 2022-10-14 — End: ?

## 2022-10-14 MED ORDER — ARIPIPRAZOLE 5 MG TABLET
ORAL_TABLET | Freq: Every day | ORAL | 0 refills | 90 days | Status: CP
Start: 2022-10-14 — End: ?
  Filled 2022-10-28: qty 90, 90d supply, fill #0

## 2022-10-14 MED ORDER — IPRATROPIUM BROMIDE 42 MCG (0.06 %) NASAL SPRAY
Freq: Four times a day (QID) | NASAL | 2 refills | 21.00000 days | Status: CN
Start: 2022-10-14 — End: 2023-10-14

## 2022-10-14 NOTE — Unmapped (Signed)
[facility] Refill Coordination Note    Specialty Medication(s) to be Shipped:   Infectious Disease: Descovy and Tivicay    Other medication(s) to be shipped: ipratropium ns  Lamotrigine  stiolto        Andrew Oconnell, DOB: Jul 01, 1956  Phone: [phone] (home)       All above HIPAA information was verified with patient.     Was a Nurse, learning disability used for this call? No    Completed refill call assessment today to schedule patient's medication shipment from the [facility] [phone]).  All relevant notes have been reviewed.     Specialty medication(s) and dose(s) confirmed: Regimen is correct and unchanged.   Changes to medications: Noelan reports no changes at this time.  Changes to insurance: No  New side effects reported not previously addressed with a pharmacist or physician: None reported  Questions for the pharmacist: No    Confirmed patient received a Conservation officer, historic buildings and a Surveyor, mining with first shipment. The patient will receive a drug information handout for each medication shipped and additional FDA Medication Guides as required.       DISEASE/MEDICATION-SPECIFIC INFORMATION        N/A    SPECIALTY MEDICATION ADHERENCE     Medication Adherence    Patient reported X missed doses in the last month: 0  Specialty Medication: descovy 200-25mg   Patient is on additional specialty medications: Yes  Additional Specialty Medications: tivicay 50mg   Patient Reported Additional Medication X Missed Doses in the Last Month: 0                          Were doses missed due to medication being on hold? No    descovy 200-25mg   : 10 days of medicine on hand   tivicay 50mg   : 10 days of medicine on hand       REFERRAL TO PHARMACIST     Referral to the pharmacist: Not needed      [facility]     Shipping address confirmed in Epic.     Delivery Scheduled: Yes, Expected medication delivery date: 10/20/22.     Medication will be delivered via UPS to the prescription address in Epic WAM.    Westley Gambles   [facility] Specialty Technician

## 2022-10-18 ENCOUNTER — Ambulatory Visit: Admit: 2022-10-18 | Discharge: 2022-10-18 | Payer: MEDICARE

## 2022-10-18 DIAGNOSIS — B20 Human immunodeficiency virus [HIV] disease: Secondary | ICD-10-CM | POA: Diagnosis not present

## 2022-10-18 DIAGNOSIS — Z79899 Other long term (current) drug therapy: Secondary | ICD-10-CM | POA: Diagnosis not present

## 2022-10-18 DIAGNOSIS — Z7951 Long term (current) use of inhaled steroids: Secondary | ICD-10-CM | POA: Diagnosis not present

## 2022-10-18 DIAGNOSIS — E7800 Pure hypercholesterolemia, unspecified: Secondary | ICD-10-CM | POA: Diagnosis not present

## 2022-10-18 DIAGNOSIS — N189 Chronic kidney disease, unspecified: Secondary | ICD-10-CM | POA: Diagnosis not present

## 2022-10-18 DIAGNOSIS — K7460 Unspecified cirrhosis of liver: Secondary | ICD-10-CM | POA: Diagnosis not present

## 2022-10-18 DIAGNOSIS — J439 Emphysema, unspecified: Secondary | ICD-10-CM | POA: Diagnosis not present

## 2022-10-18 DIAGNOSIS — Z882 Allergy status to sulfonamides status: Secondary | ICD-10-CM | POA: Diagnosis not present

## 2022-10-18 DIAGNOSIS — Z91048 Other nonmedicinal substance allergy status: Secondary | ICD-10-CM | POA: Diagnosis not present

## 2022-10-18 DIAGNOSIS — F319 Bipolar disorder, unspecified: Secondary | ICD-10-CM | POA: Diagnosis not present

## 2022-10-18 DIAGNOSIS — F17210 Nicotine dependence, cigarettes, uncomplicated: Secondary | ICD-10-CM | POA: Diagnosis not present

## 2022-10-18 LAB — COMPREHENSIVE METABOLIC PANEL
ALBUMIN: 3.8 g/dL (ref 3.4–5.0)
ALKALINE PHOSPHATASE: 89 U/L (ref 46–116)
ALT (SGPT): 9 U/L — ABNORMAL LOW (ref 10–49)
ANION GAP: 8 mmol/L (ref 5–14)
AST (SGOT): 19 U/L (ref <=34)
BILIRUBIN TOTAL: 0.5 mg/dL (ref 0.3–1.2)
BLOOD UREA NITROGEN: 14 mg/dL (ref 9–23)
BUN / CREAT RATIO: 13
CALCIUM: 10 mg/dL (ref 8.7–10.4)
CHLORIDE: 103 mmol/L (ref 98–107)
CO2: 26.2 mmol/L (ref 20.0–31.0)
CREATININE: 1.09 mg/dL
EGFR CKD-EPI (2021) MALE: 74 mL/min/{1.73_m2} (ref >=60)
GLUCOSE RANDOM: 101 mg/dL — ABNORMAL HIGH (ref 70–99)
POTASSIUM: 4.1 mmol/L (ref 3.4–4.8)
PROTEIN TOTAL: 7.8 g/dL (ref 5.7–8.2)
SODIUM: 137 mmol/L (ref 135–145)

## 2022-10-18 LAB — CBC
HEMATOCRIT: 39.7 % (ref 39.0–48.0)
HEMOGLOBIN: 13.6 g/dL (ref 12.9–16.5)
MEAN CORPUSCULAR HEMOGLOBIN CONC: 34.3 g/dL (ref 32.0–36.0)
MEAN CORPUSCULAR HEMOGLOBIN: 34.8 pg — ABNORMAL HIGH (ref 25.9–32.4)
MEAN CORPUSCULAR VOLUME: 101.6 fL — ABNORMAL HIGH (ref 77.6–95.7)
MEAN PLATELET VOLUME: 7.8 fL (ref 6.8–10.7)
PLATELET COUNT: 180 10*9/L (ref 150–450)
RED BLOOD CELL COUNT: 3.91 10*12/L — ABNORMAL LOW (ref 4.26–5.60)
RED CELL DISTRIBUTION WIDTH: 13.3 % (ref 12.2–15.2)
WBC ADJUSTED: 6 10*9/L (ref 3.6–11.2)

## 2022-10-18 LAB — AFP TUMOR MARKER: AFP-TUMOR MARKER: 3 ng/mL (ref <=8)

## 2022-10-18 LAB — PROTIME-INR
INR: 1.05
PROTIME: 11.7 s (ref 9.9–12.6)

## 2022-10-18 MED ORDER — LAMOTRIGINE 200 MG TABLET
ORAL_TABLET | Freq: Every day | ORAL | 3 refills | 30.00000 days | Status: CN
Start: 2022-10-18 — End: ?

## 2022-10-18 NOTE — Unmapped (Signed)
Great to see you.     Today at your appointment:   - blood work (MELD labs) I will be in touch via mychart or telephone if there is anything that I need to discuss.   - Liver cancer screening needs to be done every 6 months: ultrasound ordered at [facility]  - Please call Radiology at [phone] to schedule your study. They may not call you.  - No more than 2 grams tylenol for pain. Avoid NSAIDs (ibuprofen, Aleve, motrin) in liver disease  - avoid alcohol, no safe amount with chronic liver disease  - maintain a healthy weight with diet and exercise  - the following vaccines were given: RSV  - we can discuss an EGD after I see your labs--> you may not need one.       - Follow- up in 6  months    It was a pleasure caring for you today. Please contact me or my nurse, Silvestre Moment, with any questions about your care.     Reeves Forth. Raylyn Speckman, ANP- [facility]  [address]  [place] [address]  Phone [phone]       Important Contact Numbers:    Nurse:  Silvestre Moment, RN  Phone: [phone]  Main line Protection Liver: [phone]        Radiology: [phone]- Ultrasound/ MRI  If you are being scheduled for any type of radiology, you will need to call to receive your appointment time.?Please call this number for information.?    Important Contact Numbers:     GI Clinic Appointments:? [phone]  Please call the GI Clinic appointment line if you need to schedule, reschedule or cancel an appointment in clinic.? They can also answer any questions you may have about where your appointment is located and when you need to arrive.     GI Procedure Appointments:?[phone]  Please call the GI Procedures line if you need to schedule, reschedule or cancel ANY type of procedure (EGD, colonoscopy, motility testing, etc).? You can also call this number for prep instructions, etc.?     Radiology: [phone], option 3 or 4  If you are being scheduled for any type of radiology, you will need to call to receive your appointment time.? Please call this number for information.?     For emergencies after normal business hours or on weekends/holidays  Proceed to the nearest emergency room OR contact the [facility] Operator at [phone] who can page the Gastroenterology Fellow on call.     Financial Counselors:   Tracey Harries [phone]   Dorisann Frames [phone]      Pharmacy Assistance: [phone]

## 2022-10-18 NOTE — Unmapped (Signed)
[facility]    REFERRING PHYSICIAN: Dr. Miki Kins [facility] INFECTIOUS DISEASE    REASON FOR CONSULT:  Continued cirrhosis care  Secondary to chronic hepatitis C ~ Cured. +coinfected with HIV    Subjective:      Andrew Oconnell is a 67 y.o. 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 He was successfully treated with Sofosbuvir and Daklinza 60mg  x 24 weeks (start date 02/18/2015). His SVR12 HCV RNA level was not detected indicating evidence of having achieved SVR. MRI 05/19/14 revealed evidence of liver with nodular contour consistent with cirrhosis and splenomegaly. Most recent MRI of abdomen 02/2019 with similar results. EGD 09/2018 finding of Grade I esophageal varices. Patient does not have a family history of liver disease of which is aware, but he is adopted. He remains under the care of ID clinic at [facility]. HIV management ~ Tivicay 50 mg tablet once daily and Descovy 200-25 mg tablet daily. Some weekly alcohol use (liquor, 1-2 drinks weekends,  not daily).  Has been counseled extensively to avoid all alcohol. PMH Emphysema- smoking hx.     Interval events since last clinic visit with me 03/2022. Hospitalized in November for COPD exacerbation.  No liver related complaints. No alcohol. He is retired and busies himself with domestic chores and taking care of his animals. Overall doing really well. Still smoking.      Liver Care:  1. Chronic hepatitis C  HCV RNA Timeline: 2016  Pretreatment HCV RNA [id number]  SVR 2016  2.  HCC screening: [place] 04/2022: no mass  3.  Variceal screening: 2021: No findings of varices.no PHG due 2024    4.  OLT: Not indicated at this time. + continued alcohol use. Will warrant six months of alcohol counseling.   5.   Positive immunity hepatitis A and B.  6. Bone Density (appreciate assistance from PCP) Last done 2019 (normal with mildly low bone mass in femur)   7. MELD 6        Allergies   Allergen Reactions    Pollen Extracts Congestion      Sulfa (Sulfonamide Antibiotics) Rash     Current Outpatient Medications   Medication Sig Dispense Refill    albuterol HFA 90 mcg/actuation inhaler Inhale 1 puff every six (6) hours as needed for wheezing or shortness of breath. 8.5 g 0    aripiprazole (ABILIFY) 5 MG tablet Take 1 tablet (5 mg total) by mouth daily. 90 tablet 0    atorvastatin (LIPITOR) 10 MG tablet TAKE 1 TABLET BY MOUTH ONCE DAILY FOR HIGH CHOLESTEROL 90 tablet 2    dolutegravir (TIVICAY) 50 mg TABLET Take 1 tablet (50 mg total) by mouth daily. 30 tablet 11    emtricitabine-tenofovir alafen (DESCOVY) 200-25 mg tablet Take 1 tablet by mouth daily. 30 tablet 11    fluticasone (FLONASE) 50 mcg/actuation nasal spray 1 spray by Each Nare route daily. 16 g 0    ipratropium (ATROVENT) 42 mcg (0.06 %) nasal spray Use 2 sprays into each nostril four (4) times a day. 15 mL 2    lamoTRIgine (LAMICTAL) 200 MG tablet Take 1 tablet (200 mg total) by mouth daily. 30 tablet 3    losartan (COZAAR) 25 MG tablet Take 1 tablet by mouth once a day 90 tablet 1    naproxen sodium (ALEVE) 220 MG tablet Take 1 tablet (220 mg total) by mouth daily as needed for pain.      tiotropium-olodaterol (  STIOLTO RESPIMAT) 2.5-2.5 mcg/actuation Mist Inhale 2 puffs daily. 4 g 11    traZODone (DESYREL) 100 MG tablet Take 2 and 1/2  tablets (250 mg total) by mouth nightly. 250 tablet 0     No current facility-administered medications for this visit.      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    Human immunodeficiency virus (HIV) disease (CMS-HCC) 01/29/2013    COPD (chronic obstructive pulmonary disease) (CMS-HCC) 01/29/2013    Hypercholesteremia 01/29/2013    Chronic renal insufficiency 06/12/2013    Cancer screening 06/19/2013    Tobacco abuse 06/19/2013    Dry skin 08/20/2013    Dysphagia 05/19/2014 Aortic root dilation (CMS-HCC) 11/25/2014    Left shoulder pain 11/25/2014    Insomnia 02/23/2016    Erectile disorder due to medical 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 11/29/2016    Benign prostatic hyperplasia with lower urinary tract symptoms 05/02/2017    Depression 02/02/2021    Allergic rhinitis 07/04/2022     Resolved Ambulatory Problems     Diagnosis Date Noted    Nausea & vomiting 05/19/2014    Syncope 06/17/2014    Shortness of breath 09/17/2015    COPD exacerbation (CMS-HCC) 01/11/2016    COPD, moderate (CMS-HCC) 07/01/2014    HIV positive, asymptomatic (CMS-HCC) 07/01/2014    Acute bronchitis 10/23/2018    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) 02/02/2021     Past Medical History:   Diagnosis Date    Emphysema of lung (CMS-HCC)     Hepatitis C     Human immunodeficiency virus (HIV) disease (CMS-HCC) 01/29/2013    Reflux     Vomiting      Past Surgical History:   Procedure Laterality Date    BACK SURGERY N/A 1990    LIGATION / DIVISION SAPHENOUS VEIN Bilateral     90s    PR COLONOSCOPY W/BIOPSY SINGLE/MULTIPLE  12/22/2021    Procedure: COLONOSCOPY, FLEXIBLE, PROXIMAL TO SPLENIC FLEXURE; WITH BIOPSY, SINGLE OR MULTIPLE;  Surgeon: Luanne Bras, MD;  Location: [facility];  Service: Gastroenterology    PR COLSC FLX W/RMVL OF TUMOR POLYP LESION SNARE TQ N/A 05/08/2015    Procedure: COLONOSCOPY FLEX; W/REMOV TUMOR/LES BY SNARE;  Surgeon: Teodoro Spray, MD;  Location: [facility];  Service: Gastroenterology    PR COLSC FLX W/RMVL OF TUMOR POLYP LESION SNARE TQ Left 12/22/2021    Procedure: COLONOSCOPY FLEX; W/REMOV TUMOR/LES BY SNARE;  Surgeon: Luanne Bras, MD;  Location: [facility];  Service: Gastroenterology    PR ESOPHAGEAL MOTILITY STUDY, MANOMETRY N/A 06/04/2014    Procedure: ESOPHAGEAL MOTILITY STUDY W/INT & REP;  Surgeon: Nurse-Based Giproc;  Location: [facility] PROC[facility]URES [facility];  Service: Gastroenterology    PR REPAIR BICEPS LONG TENDON Left 03/11/2016    Procedure: TENODESIS LONG TENDON BICEPS;  Surgeon: Tomasa Rand, MD;  Location: ASC OR [facility];  Service: Orthopedics    PR SHLDR ARTHROSCOP,SURG,W/ROTAT CUFF REPR Left 03/11/2016    Procedure: ARTHROSCOPY, SHOULDER, SURGICAL; WITH ROTATOR CUFF REPAIR;  Surgeon: Tomasa Rand, MD;  Location: [facility];  Service: Orthopedics    PR SURGICAL ARTHROSCOPY SHOULDER XTNSV DBRDMT 3+ Left 03/11/2016    Procedure: ARTHROSCOPY SHOULDER SURG; DEBRID EXTEN;  Surgeon: Tomasa Rand, MD;  Location: ASC OR [facility];  Service: Orthopedics    PR UPPER GI ENDOSCOPY,BIOPSY N/A 05/20/2014    Procedure: UGI ENDOSCOPY; WITH BIOPSY, SINGLE OR MULTIPLE;  Surgeon: Donneta Romberg, MD;  Location: GI PROCEDURES [facility];  Service: Gastroenterology    PR UPPER GI ENDOSCOPY,BIOPSY N/A 10/12/2018    Procedure: UGI ENDOSCOPY; WITH BIOPSY, SINGLE OR MULTIPLE;  Surgeon: Janyth Pupa, MD;  Location: [facility]URES [facility];  Service: Gastroenterology    PR UPPER GI ENDOSCOPY,DIAGNOSIS N/A 11/06/2015    Procedure: UGI ENDO, INCLUDE ESOPHAGUS, STOMACH, & DUODENUM &/OR JEJUNUM; DX W/WO COLLECTION SPECIMN, BY BRUSH OR WASH;  Surgeon: Rona Ravens, MD;  Location: GI PROCEDURES [facility];  Service: Gastroenterology    PR UPPER GI ENDOSCOPY,DIAGNOSIS N/A 01/31/2020    Procedure: UGI ENDO, INCLUDE ESOPHAGUS, STOMACH, & DUODENUM &/OR JEJUNUM; DX W/WO COLLECTION SPECIMN, BY BRUSH OR WASH;  Surgeon: Annie Paras, MD;  Location: GI PROCEDURES [facility];  Service: Gastroenterology     Family History   Adopted: Yes   Problem Relation Age of Onset    Hepatitis Sister     Rheumatologic disease Sister     Parkinsonism Sister     Melanoma Neg Hx     Basal cell carcinoma Neg Hx     Squamous cell carcinoma Neg Hx      Social History     Socioeconomic History    Marital status: Single   Tobacco Use    Smoking status: Every Day     Current packs/day: 0.50     Average packs/day: 0.5 packs/day for 52.7 years tobacco: Never    Tobacco comments:     10 cigarettes/day   Vaping Use    Vaping Use: Never used   Substance and Sexual Activity    Alcohol use: Not Currently     Comment: has not had anything to drink in 2 months    Drug use: Yes     Frequency: 2.0 times per week     Types: Marijuana     Comment: weekly use ~ 2 times week. last used 2 days ago    Sexual activity: Not Currently   Other Topics Concern    Do you use sunscreen? Yes    Tanning bed use? No    Are you easily burned? Yes    Excessive sun exposure? No    Blistering sunburns? Yes     Objective:     Vitals:    10/18/22 1220   BP: 125/66   Pulse: 89   Temp: 36.5 ??C (97.7 ??F)   SpO2: 96%       Constitutional:  In no apparent distress.   Eyes: anicteric sclerae   Cardiovascular: No peripheral edema.   Gastrointestinal: Soft, nontender abdomen without hepatosplenomegaly, hernias or masses. No ascites   Skin: No spider angiomata, palmar erythema, rashes.   Neurologic: Nonfocal, cranial nerves grossly intact, no asterixis.   Psychiatric: Alert and oriented to person, place and time. Normal affect.      + palmar erythema     Laboratory Studies:  Office Visit on 10/18/2022   Component Date Value Ref Range Status    AFP-Tumor Marker 10/18/2022 3  <=8 ng/mL Final    PT 10/18/2022 11.7  9.9 - 12.6 sec Final    INR 10/18/2022 1.05   Final    Sodium 10/18/2022 137  135 - 145 mmol/L Final    Potassium 10/18/2022 4.1  3.4 - 4.8 mmol/L Final    Chloride 10/18/2022 103  98 - 107  mmol/L Final    CO2 10/18/2022 26.2  20.0 - 31.0 mmol/L Final    Anion Gap 10/18/2022 8  5 - 14 mmol/L Final    BUN 10/18/2022 14  9 - 23 mg/dL Final    Creatinine 16/07/9603 1.09  0.73 - 1.18 mg/dL Final    BUN/Creatinine Ratio 10/18/2022 13   Final    eGFR CKD-EPI (2021) Male 10/18/2022 74  >=60 mL/min/1.90m2 Final    Glucose 10/18/2022 101 (H)  70 - 99 mg/dL Final    Calcium 54/06/8118 10.0  8.7 - 10.4 mg/dL Final    Albumin 14/78/2956 3.8  3.4 - 5.0 g/dL Final    Total Protein 10/18/2022 7.8  5.7 - 8.2 g/dL Final    Total Bilirubin 10/18/2022 0.5  0.3 - 1.2 mg/dL Final    AST 21/30/8657 19  <=34 U/L Final    ALT 10/18/2022 9 (L)  10 - 49 U/L Final    Alkaline Phosphatase 10/18/2022 89  46 - 116 U/L Final    WBC 10/18/2022 6.0  3.6 - 11.2 10*9/L Final    RBC 10/18/2022 3.91 (L)  4.26 - 5.60 10*12/L Final    HGB 10/18/2022 13.6  12.9 - 16.5 g/dL Final    HCT 84/69/6295 39.7  39.0 - 48.0 % Final    MCV 10/18/2022 101.6 (H)  77.6 - 95.7 fL Final    MCH 10/18/2022 34.8 (H)  25.9 - 32.4 pg Final    MCHC 10/18/2022 34.3  32.0 - 36.0 g/dL Final    RDW 28/41/3244 13.3  12.2 - 15.2 % Final    MPV 10/18/2022 7.8  6.8 - 10.7 fL Final    Platelet 10/18/2022 180  150 - 450 10*9/L Final   ]MELD 3.0: 7 at 10/18/2022  1:34 PM  MELD-Na: 8 at 10/18/2022  1:34 PM  Calculated from:  Serum Creatinine: 1.09 mg/dL at 0/10/270  5:36 PM  Serum Sodium: 137 mmol/L at 10/18/2022  1:34 PM  Total Bilirubin: 0.5 mg/dL (Using min of 1 mg/dL) at 03/20/4033  7:42 PM  Serum Albumin: 3.8 g/dL (Using max of 3.5 g/dL) at 02/22/5637  7:56 PM  INR(ratio): 1.05 at 10/18/2022  1:34 PM  Age at listing (hypothetical): 17 years  Sex: Male at 10/18/2022  1:34 PM   ]    Assessment/Plan:   1.  Well compensated cirrhosis secondary to HCV: s/p cure in 2016  Mr. Samual Hari is 67 y.o. male whose chief complaint is continued cirrhosis care. History of HCV, prior treatment exposure with interferon. He is successfully treated and cured ~ Sofosbuvir and Dalkinza x 24 weeks. He is coinfected with HIV. HIV RNA level has been well suppressed with current medications. PMH of well compensated cirrhosis based on continued evidence of good synthetic liver function and no events (-ascites, - HE, - upper GI bleed) MELD 7    ~ HCC screening:  [place] q 6 months with AFP. --> [facility]  ~Variceal screening: EGD 2021: no varices: Due 2024. Discussed if e/o portal HTN then order EGD (normal in 2021). Does have splenomegaly on imaging but plt count normal. Reasonable to defer screening for now.   ~ Recommend Bone Density--> getting this month (next Tuesday)  ~ RSV vaccine today      It was my pleasure caring for this patient. Please reach out with any questions.     Reeves Forth. Sylvanus Telford, ANP- [facility]  [facility]  [address]  [place] [address]  [phone]

## 2022-10-18 NOTE — Unmapped (Signed)
Medication Requested: LAMICTAL       Future Appointments   Date Time Provider Department Center   10/18/2022 12:30 PM [place], Nikki Dom [facility]   10/25/2022 10:00 AM [facility] DEXA RM 1 IDEX[facility] [place] [facility]   01/23/2023 11:30 AM [facility] CT RM 1 ICTHUFF [place] - [facility]     Per Provider Note:  and would benefit from augmentation of his current medication regimen of Lamictal 200mg .     Standing order protocol requirements met?: Yes    Sent to: Provider for signing    Days Supply Given: 30 days  Number of Refills: 0

## 2022-10-19 MED FILL — STIOLTO RESPIMAT 2.5 MCG-2.5 MCG/ACTUATION SOLUTION FOR INHALATION: RESPIRATORY_TRACT | 30 days supply | Qty: 4 | Fill #7

## 2022-10-19 MED FILL — DESCOVY 200 MG-25 MG TABLET: ORAL | 30 days supply | Qty: 30 | Fill #7

## 2022-10-19 MED FILL — TIVICAY 50 MG TABLET: ORAL | 30 days supply | Qty: 30 | Fill #7

## 2022-10-20 MED ORDER — IPRATROPIUM BROMIDE 42 MCG (0.06 %) NASAL SPRAY
Freq: Four times a day (QID) | NASAL | 2 refills | 21 days
Start: 2022-10-20 — End: 2023-10-20

## 2022-10-21 ENCOUNTER — Institutional Professional Consult (permissible substitution): Payer: Medicare Other | Admitting: Pulmonary Disease

## 2022-10-24 ENCOUNTER — Ambulatory Visit: Admit: 2022-10-24 | Discharge: 2022-10-25 | Payer: MEDICARE

## 2022-10-24 DIAGNOSIS — J309 Allergic rhinitis, unspecified: Principal | ICD-10-CM

## 2022-10-24 DIAGNOSIS — Z720 Tobacco use: Principal | ICD-10-CM

## 2022-10-24 DIAGNOSIS — J449 Chronic obstructive pulmonary disease, unspecified: Principal | ICD-10-CM

## 2022-10-24 DIAGNOSIS — Z23 Encounter for immunization: Secondary | ICD-10-CM | POA: Diagnosis not present

## 2022-10-24 MED ORDER — UMECLIDINIUM 62.5 MCG-VILANTEROL 25 MCG/ACTUATION POWDR FOR INHALATION
Freq: Every day | RESPIRATORY_TRACT | 3 refills | 90 days | Status: CP
Start: 2022-10-24 — End: 2023-10-24

## 2022-10-24 NOTE — Unmapped (Signed)
HIV-PULMONARY CLINIC VISIT:    Patient: Andrew Oconnell(May 10, 1956)   Reason for visit: COPD    HISTORY OF PRESENT ILLNESS:     Mr. Andrew Oconnell is a 67 year old gentleman with a PMH as below including HIV on ART, tobacco use and COPD.     HISTORY OF PRESENT ILLNESS (06/2020):   Mr. Andrew Oconnell reports that he was first diagnosed with COPD in 2005. He had previously been seeing a pulmonologist in [place] and has been on Advair with prn albuterol for a few years though he ran out of the Advair a few months ago. He reports that he has had some dyspnea on exertion for the past few months and reports an mMRC score of 2 today. He uses his rescue inhaler 2-3 times per week. He denies any cough but does endorse morning sputum production that is typically clear and never has blood. He denies any wheezing or chest tightness. He has chronic rhinosinusitis, for which he follows with ENT - currently managed with sinus rinses and oral antihistamines. He denies any fevers or chills and reports occasional mild night sweats that have been stable for years. He reports weight gain of 3 pounds in the last month. He denies any lower extremity swelling. He has not required antibiotics or oral steroids in the last 2-3 years for a COPD exacerbation.    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 He reports that his respiratory symptoms remain very well controlled. His mMRC score today is 1.     INTERVAL HISTORY (11/2021):  His mMRC score is 1 today and he is presently using his albuterol inhaler 1-2 times a month, with relief. He saw ENT in November who prescribed nasal hygeine - he has been doing the nasal spray mostly consistently.     INTERVAL HISTORY:  Mr. Andrew Oconnell continues to report feeling overall well. His mMRC score today is 1. He denies any cough and minimal DOE. His allergic rhinitis symptoms continue wax and wane. He has been using the Flonase daily and the Claritin. He was recommended sinus irrigation by ENT but hated it. He has the Atrovent nasal spray they recommended at home, but hasn't been using it. He has not followed up with them. He reports that he is compliant with his Stiolto and uses his albuterol rescue inhaler at most, once a week. He denies any fevers, chills, chest pain, changes in his weight or swelling in his legs.      INTERVAL HISTORY (10/2022):   Mr. Andrew Oconnell was admitted for a week at [facility] in November for a COPD exacerbation. He did not require the ICU but did require oxygen during his stay, though not at discharge. He was treated with antibiotics and steroids. He denies any obvious trigger for the exacerbation but was feeling run down prior to presenting. This was his first hospitalization since 2019 and he has not had any other exacerbations in the past year. He reports that since discharge, he has been feeling at his baseline - his mMRC score is 1 today, he denies any cough, wheeze, or chest tightness. He remains on his Stiolto and has not required his rescue inhaler in weeks.     Medication Adherence: Adherent all of the time.  Inhaler Technique: Using inhaler properly.    Pulmonary medications: Stiolto, prn Albuterol    HIV status:  Date: CD4 Viral load   10/2018 543  ND   10/2019 593 ND   03/2020  <40   04/2021 385 ND   09/2021 429 <40   02/2022 444 <20     Current regimen: Tivicay and Descovy    REVIEW OF SYSTEMS: All systems were reviewed and found to be negative except as above in the HPI.    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 Reflux     Vomiting     after eating     Past Surgical History:   Procedure Laterality Date    BACK SURGERY N/A 1990    LIGATION / DIVISION SAPHENOUS VEIN Bilateral     90s    PR COLONOSCOPY W/BIOPSY SINGLE/MULTIPLE  12/22/2021    Procedure: COLONOSCOPY, FLEXIBLE, PROXIMAL TO SPLENIC FLEXURE; WITH BIOPSY, SINGLE OR MULTIPLE;  Surgeon: Luanne Bras, MD;  [facility]ED[facility];  Service: Gastroenterology    PR COLSC FLX W/RMVL OF TUMOR POLYP LESION SNARE TQ N/A 05/08/2015    Procedure: COLONOSCOPY FLEX; W/REMOV TUMOR/LES BY SNARE;  Surgeon: Teodoro Spray, MD;  Location: [facility];  Service: Gastroenterology    PR COLSC FLX W/RMVL OF TUMOR POLYP LESION SNARE TQ Left 12/22/2021    Procedure: COLONOSCOPY FLEX; W/REMOV TUMOR/LES BY SNARE;  Surgeon: Luanne Bras, MD;  Location: [facility];  Service: Gastroenterology    PR ESOPHAGEAL MOTILITY STUDY, MANOMETRY N/A 06/04/2014    Procedure: ESOPHAGEAL MOTILITY STUDY W/INT & REP;  Surgeon: Nurse-Based Giproc;  Location: GI PROCEDURES [facility];  Service: Gastroenterology    PR REPAIR BICEPS LONG TENDON Left 03/11/2016    Procedure: TENODESIS LONG TENDON BICEPS;  Surgeon: Tomasa Rand, MD;  Location: [facility] OR [facility];  Service: Orthopedics    PR SHLDR ARTHROSCOP,SURG,W/ROTAT CUFF REPR Left 03/11/2016    Procedure: ARTHROSCOPY, SHOULDER, SURGICAL; WITH ROTATOR CUFF REPAIR;  Surgeon: Tomasa Rand, MD;  Location: ASC OR [facility];  Service: Orthopedics    PR SURGICAL ARTHROSCOPY SHOULDER XTNSV DBRDMT 3+ Left 03/11/2016    Procedure: ARTHROSCOPY SHOULDER SURG; DEBRID EXTEN;  Surgeon: Tomasa Rand, MD;  Location: ASC OR [facility];  Service: Orthopedics    PR UPPER GI ENDOSCOPY,BIOPSY N/A 05/20/2014    Procedure: UGI ENDOSCOPY; WITH BIOPSY, SINGLE OR MULTIPLE;  Surgeon: Donneta Romberg, MD;  Location: GI PROCEDURES [facility];  Service: Gastroenterology    PR UPPER GI ENDOSCOPY,BIOPSY N/A 10/12/2018    Procedure: UGI ENDOSCOPY; WITH BIOPSY, SINGLE OR MULTIPLE;  Surgeon: Janyth Pupa, MD;  Location: GI PROCEDURES [facility];  Service: Gastroenterology    PR UPPER GI ENDOSCOPY,DIAGNOSIS N/A 11/06/2015    Procedure: UGI ENDO, INCLUDE ESOPHAGUS, STOMACH, & DUODENUM &/OR JEJUNUM; DX W/WO COLLECTION SPECIMN, BY BRUSH OR WASH;  Surgeon: Rona Ravens, MD;  Location: GI PROCEDURES [facility];  Service: Gastroenterology    PR UPPER GI ENDOSCOPY,DIAGNOSIS N/A 01/31/2020    Procedure: UGI ENDO, INCLUDE ESOPHAGUS, STOMACH, & DUODENUM &/OR JEJUNUM; DX W/WO COLLECTION SPECIMN, BY BRUSH OR WASH;  Surgeon: Annie Paras, MD;  Location: GI PROCEDURES [facility];  Service: Gastroenterology  SOCIAL AND FAMILY HISTORY:   Social history: He lives at home with a friend and they have a Development worker, international aid and cat. He is currently retired but previously worked for [employer]. 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 He is a current smoker and started at the age of 59. He has smoked on average 0.5 packs per day (current 25 pack-year history). He also reports inhaled cannabis use a few times a week. He is smoking 10 cpd. He is currently using Cannabis 1-2 month.     Family History: Adopted, no known history.    MEDICATIONS AND ALLERGIES:     CURRENT MEDICATIONS:  Outpatient Encounter Medications as of 10/24/2022   Medication Sig Dispense Refill    aripiprazole (ABILIFY) 5 MG tablet Take 1 tablet (5 mg total) by mouth daily. 90 tablet 0    atorvastatin (LIPITOR) 10 MG tablet TAKE 1 TABLET BY MOUTH ONCE DAILY FOR HIGH CHOLESTEROL 90 tablet 2    dolutegravir (TIVICAY) 50 mg TABLET Take 1 tablet (50 mg total) by mouth daily. 30 tablet 11    emtricitabine-tenofovir alafen (DESCOVY) 200-25 mg tablet Take 1 tablet by mouth daily. 30 tablet 11    lamoTRIgine (LAMICTAL) 200 MG tablet Take 1 tablet (200 mg total) by mouth daily. 30 tablet 3    losartan (COZAAR) 25 MG tablet Take 1 tablet by mouth once a day 90 tablet 1    albuterol HFA 90 mcg/actuation inhaler Inhale 1 puff every six (6) hours as needed for wheezing or shortness of breath. 8.5 g 0    fluticasone (FLONASE) 50 mcg/actuation nasal spray 1 spray by Each Nare route daily. 16 g 0    ipratropium (ATROVENT) 42 mcg (0.06 %) nasal spray Use 2 sprays into each nostril four (4) times a day. 15 mL 2    naproxen sodium (ALEVE) 220 MG tablet Take 1 tablet (220 mg total) by mouth daily as needed for pain.      tiotropium-olodaterol (STIOLTO RESPIMAT) 2.5-2.5 mcg/actuation Mist Inhale 2 puffs daily. 4 g 11    traZODone (DESYREL) 100 MG tablet Take 2 and 1/2  tablets (250 mg total) by mouth nightly. 250 tablet 0     No facility-administered encounter medications on file as of 10/24/2022.       ALLERGIES:  Allergies as of 10/24/2022 - Reviewed 10/24/2022   Allergen Reaction Noted    Pollen extracts  03/09/2017    Sulfa (sulfonamide antibiotics) Rash 01/29/2013       PHYSICAL EXAM:     Vitals:    10/24/22 0947   BP: 124/70   BP Site: L Arm   BP Position: Sitting   BP Cuff Size: Medium   Pulse: 100   Temp: 36.5 ??C (97.7 ??F)   TempSrc: Tympanic   Weight: 93.3 kg (205 lb 9.6 oz)   Height: 195.6 cm (6' 5)      Body mass index is 24.38 kg/m??.    GENERAL: well nourished, cooperative, no acute distress  EYES: anicteric, noninjected, EOMi  HEENT: Neck supple, trachea midline, moist mucus membranes, oropharynx without lesions or thrush  CV: Regular rate, normal rhythm, no murmurs/rubs/gallops  PULM: Clear to auscultation bilaterally. No dullness to percussion. Normal excursion. Normal work of breathing.   ABD: Soft, nontender, nondistended.   EXTREMITIES: No digital clubbing. No edema.  SKIN: No rashes, lesions, or skin breakdown. Warm and well perfused.  NEURO: No focal neurologic deficits. Moves all extremities  and follows commands.   PSYCH: Well groomed, appropriate mood and affect, good eye contact.       ANCILLARY DATA:     All imaging and labs were reviewed personally.    Date: FVC (% Pred) FEV1 (% Pred)  Pre-BD FEV1 (%Pred)  Post- BD FEV1/FVC DLCO (% Pred) VC (% Pred) TLC (% Pred) RV (%Pred)   August 2012 6.34 (107%) 3.10 (69%) 3.29 (73%) 49 (76) 54%      January 2022 6.49 (117%) 2.91 (70%) 2.92 (71%) 45 (63) 79%        CT LCS (11/2019): Non-calcified RML 0.4 cm, Lung-RADS 2.  CT LCS (12/2020): 0.4 cm RML nodule unchanged. New 0.3 LUL nodule. Lung-RADS 2.  CT LCS (01/2022): Severe emphysema with large R anterior bulla. Stable 4 mm RML nodule.     CT sinus (11/2019): Moderate degree of mucosal thickening in the ethmoidal sinuses with mild mucosal thickening seen in the rest of the sinuses. Small right maxillary sinus noted.   IgE (06/2020): 114      ASSESSMENT AND PLAN:     Mr. Kaiser is a 67 year old gentleman with a PMH as below including HIV on ART, tobacco use and group IIE COPD.     His recent hospitalization with a COPD exacerbation changes his COPD grade from IIA to IIE; however his mMRC score and PRN albuterol use since then indicate good control. Rather than stepping up his inhaler regimen, we will continue his LABA/LAMA (though will change his Stiolto to [employer] given a change in coverage) with close monitoring for now. I would like to repeat PFTs at his next visit. We continue to discuss the importance of tobacco cessation particularly on preventing continued lung function decline but he is not interested today.     His allergic rhinitis remains under good control at present.      He is up to date on his influenza, COVID, TdaP, and RSV vaccines. We will give him the PCV-20 vaccine today.      He is engaged in lung cancer screening and his next CT is due in April 2024. It has already been ordered.     I will have him follow up in 3 months.    I personally spent 23 minutes face-to-face and non-face-to-face in the care of this patient, which includes all pre, intra, and post visit time on the date of service.

## 2022-10-24 NOTE — Unmapped (Signed)
pneumonia vaccine Prevnar 20.  Given L Deltoid Im tolerated well. Informational vaccine form provided patient voiced understanding

## 2022-10-25 ENCOUNTER — Ambulatory Visit: Admit: 2022-10-25 | Discharge: 2022-10-26 | Payer: MEDICARE

## 2022-10-25 DIAGNOSIS — M85852 Other specified disorders of bone density and structure, left thigh: Secondary | ICD-10-CM | POA: Diagnosis not present

## 2022-10-25 MED ORDER — CALCIUM 600 + D(3) 600 MG-5 MCG (200 UNIT) CAPSULE
ORAL_CAPSULE | 11 refills | 0 days
Start: 2022-10-25 — End: ?

## 2022-11-08 ENCOUNTER — Institutional Professional Consult (permissible substitution): Payer: Medicare Other | Admitting: Student in an Organized Health Care Education/Training Program

## 2022-11-08 MED FILL — CALCIUM 600 + D(3) 600 MG-5 MCG (200 UNIT) CAPSULE: 90 days supply | Qty: 90 | Fill #0

## 2022-11-08 MED FILL — ANORO ELLIPTA 62.5 MCG-25 MCG/ACTUATION POWDER FOR INHALATION: RESPIRATORY_TRACT | 30 days supply | Qty: 60 | Fill #0

## 2022-11-09 MED ORDER — LAMOTRIGINE 200 MG TABLET
ORAL_TABLET | Freq: Every day | ORAL | 3 refills | 90 days
Start: 2022-11-09 — End: ?

## 2022-11-14 ENCOUNTER — Institutional Professional Consult (permissible substitution): Payer: Medicare Other | Admitting: Student in an Organized Health Care Education/Training Program

## 2022-11-15 ENCOUNTER — Institutional Professional Consult (permissible substitution): Payer: Medicare Other | Admitting: Pulmonary Disease

## 2022-11-24 NOTE — Unmapped (Signed)
[facility] Refill Coordination Note    Specialty Medication(s) to be Shipped:   Infectious Disease: Descovy and Tivicay    Other medication(s) to be shipped: No additional medications requested for fill at this time     Andrew Oconnell, DOB: [birth year]  Phone: [phone] (home)       All above HIPAA information was verified with patient.     Was a Nurse, learning disability used for this call? No    Completed refill call assessment today to schedule patient's medication shipment from the [facility] [phone]).  All relevant notes have been reviewed.     Specialty medication(s) and dose(s) confirmed: Regimen is correct and unchanged.   Changes to medications: Andrew Oconnell reports no changes at this time.  Changes to insurance: No  New side effects reported not previously addressed with a pharmacist or physician: None reported  Questions for the pharmacist: No    Confirmed patient received a Conservation officer, historic buildings and a Surveyor, mining with first shipment. The patient will receive a drug information handout for each medication shipped and additional FDA Medication Guides as required.       DISEASE/MEDICATION-SPECIFIC INFORMATION        N/A    SPECIALTY MEDICATION ADHERENCE     Medication Adherence    Patient reported X missed doses in the last month: 0  Specialty Medication: descovy 200-25mg   Patient is on additional specialty medications: Yes  Additional Specialty Medications: tivicay 50mg   Patient Reported Additional Medication X Missed Doses in the Last Month: 0  Informant: patient                  Confirmed plan for next specialty medication refill: delivery by pharmacy  Refills needed for supportive medications: not needed              Were doses missed due to medication being on hold? No    Descovy 200-25 mg: 10 days of medicine on hand   Tivicay 50 mg: 10 days of medicine on hand       REFERRAL TO PHARMACIST     Referral to the pharmacist: Not needed      [facility]     Shipping address confirmed in Epic.     Delivery Scheduled: Yes, Expected medication delivery date: 11/28/22.     Medication will be delivered via UPS to the prescription address in Epic WAM.    Andrew Oconnell, PharmD   [facility] Specialty Pharmacist

## 2022-11-25 DIAGNOSIS — B20 Human immunodeficiency virus [HIV] disease: Principal | ICD-10-CM

## 2022-11-25 MED FILL — TIVICAY 50 MG TABLET: ORAL | 30 days supply | Qty: 30 | Fill #8

## 2022-11-25 MED FILL — DESCOVY 200 MG-25 MG TABLET: ORAL | 30 days supply | Qty: 30 | Fill #8

## 2022-11-29 MED ORDER — LOSARTAN 25 MG TABLET
ORAL_TABLET | Freq: Every day | ORAL | 1 refills | 90.00000 days
Start: 2022-11-29 — End: ?

## 2022-11-29 MED ORDER — TRAZODONE 100 MG TABLET
ORAL_TABLET | Freq: Every evening | ORAL | 1 refills | 100 days | Status: CP
Start: 2022-11-29 — End: ?
  Filled 2022-12-08: qty 225, 90d supply, fill #0

## 2022-11-29 NOTE — Unmapped (Signed)
Medication Requested: Trazodone      Future Appointments   Date Time Provider Department Center   01/23/2023  9:20 AM PFT 2 [facility]   01/23/2023 10:00 AM Sallyanne Kuster, Mardene Speak, MD [facility]   01/23/2023 11:30 AM [facility] CT RM 1 [facility] [place] [facility]   01/26/2023 10:30 AM Russ Halo, MD [facility]     Per Provider Note:  ContinueTrazodone to 250mg  nightly for refractory insomnia       Standing order protocol requirements met?: Yes    Sent to: Pharmacy per protocol    Days Supply Given: 90 days  Number of Refills: 1

## 2022-12-08 MED FILL — LAMOTRIGINE 200 MG TABLET: ORAL | 90 days supply | Qty: 90 | Fill #0

## 2022-12-08 MED FILL — ATORVASTATIN 10 MG TABLET: ORAL | 90 days supply | Qty: 90 | Fill #2

## 2022-12-13 MED ORDER — LOSARTAN 25 MG TABLET
ORAL_TABLET | Freq: Every day | ORAL | 3 refills | 90 days
Start: 2022-12-13 — End: ?

## 2022-12-14 MED FILL — LOSARTAN 25 MG TABLET: ORAL | 90 days supply | Qty: 90 | Fill #0

## 2022-12-15 NOTE — Unmapped (Signed)
[facility] Refill Coordination Note    Specialty Medication(s) to be Shipped:   Infectious Disease: Descovy and Tivicay    Other medication(s) to be shipped:  Anoro Ellipta     Andrew Oconnell, DOB: 01-05-1956  Phone: [phone] (home)       All above HIPAA information was verified with patient.     Was a Nurse, learning disability used for this call? No    Completed refill call assessment today to schedule patient's medication shipment from the [facility] [phone]).  All relevant notes have been reviewed.     Specialty medication(s) and dose(s) confirmed: Regimen is correct and unchanged.   Changes to medications: Diontre reports no changes at this time.  Changes to insurance: No  New side effects reported not previously addressed with a pharmacist or physician: None reported  Questions for the pharmacist: No    Confirmed patient received a Conservation officer, historic buildings and a Surveyor, mining with first shipment. The patient will receive a drug information handout for each medication shipped and additional FDA Medication Guides as required.       DISEASE/MEDICATION-SPECIFIC INFORMATION        N/A    SPECIALTY MEDICATION ADHERENCE     Medication Adherence    Patient reported X missed doses in the last month: 0  Specialty Medication: Descovy 200-25mg   Patient is on additional specialty medications: Yes  Additional Specialty Medications: Tivicay 50mg   Patient Reported Additional Medication X Missed Doses in the Last Month: 0  Patient is on more than two specialty medications: No              Were doses missed due to medication being on hold? No    Tivicay 50 mg: 10 days of medicine on hand   Descovy 200-25  mg: 10 days of medicine on hand   REFERRAL TO PHARMACIST     Referral to the pharmacist: Not needed      [facility]     Shipping address confirmed in Epic.     Patient was notified of new phone menu : Yes    Delivery Scheduled: Yes, Expected medication delivery date: 12/21/22.     Medication will be delivered via UPS to the prescription address in Epic WAM.    Tera Helper, [facility]   [facility] Specialty Pharmacist

## 2022-12-20 MED FILL — DESCOVY 200 MG-25 MG TABLET: ORAL | 30 days supply | Qty: 30 | Fill #9

## 2022-12-20 MED FILL — TIVICAY 50 MG TABLET: ORAL | 30 days supply | Qty: 30 | Fill #9

## 2022-12-20 MED FILL — ANORO ELLIPTA 62.5 MCG-25 MCG/ACTUATION POWDER FOR INHALATION: RESPIRATORY_TRACT | 30 days supply | Qty: 60 | Fill #1

## 2023-01-03 NOTE — Unmapped (Signed)
Linkage and Retention Coordinator drafted and sent an out of care letter in efforts to get the patient back into care.     [facility] Care Plan: Out of Care Patient (>6 months)    Patient's last ID clinic appt: 03/11/2022    Goal: Patient will have an office visit within 6 months of the last office visit unless stated otherwise by their provider.     Contact Interventions may include:  Placing phone calls to the patient and/or listed contacts.  Sending the patient an Out of Care text message through the [employer] App or [facility] cell phone (if texting agreement is signed).  Sending the patient an Out of Care MyChart/letter.  Contacting the patient's most recent pharmacies as needed for additional information such as contact information or refill history.  Contacting the patient's case manager/caregiver as needed for additional information, if assigned.  Researching other online resources as needed.     If the patient is reached, the [employer] will attempt to:  Update all contact information including primary and secondary phone numbers and emergency contact details and address. Confirm access to MyChart.  Complete the Out of Care Intervention questionnaire with the patient.  Explore and discuss any barriers to visit attendance the patient may be experiencing that result in missed appointments, no-shows, cancellations, or lack of a scheduled follow-up appointment. These may include communication barriers (language, phone access, texting, MyChart), transportation, financial concerns, time off work, mental health/substance use, and/or multiple medical appointments.  Make appropriate referrals and link the patient to clinic nurses, social workers, benefits counselors, or other support services in the [facility] ID clinic to address concerns or barriers to care.  Schedule a follow-up appointment at an appropriate interval based on a completed assessment.  Confirm the patient has access to ART medications until that appointment. If the patient is not currently taking ART, needs refills, or is having difficulty taking the medication, refer to the clinical staff for follow up via [employer].    Monitor attendance of the patient's scheduled appointment and provide reminder calls/texts/messages consistent with patient preference, if needed.  Continue follow-up if indicated.    Expected Outcome:  Patient will have an office visit within 6 months of the last office visit unless stated otherwise by their provider.    If the patient has an urgent medical problem send a chat message to the clinic nursing staff for immediate follow-up     **If the patient is not able to be located or retained in HIV care, a referral will be placed to the [facility] Counselor for further follow-up.    Duration of Service: 15 minutes    Ivett Luebbe  [facility]

## 2023-01-09 NOTE — Unmapped (Signed)
Linkage and Retention Coordinator attempted to call patient for patient's re-engagement in care. Left a discreet 'appointment needed' message in voicemail box.    Linkage and Retention Coordinator sent text message to patient to re-engage in care via [employer] texting app.    Linkage and Retention Coordinator sent MyChart message to patient to re-engage in care.    [facility] Care Plan: Out of Care Patient (>6 months)    Patient's last ID clinic appt: 03/15/2022    Goal: Patient will have an office visit within 6 months of the last office visit unless stated otherwise by their provider.     Contact Interventions may include:  Placing phone calls to the patient and/or listed contacts.  Sending the patient an Out of Care text message through the [employer] App or [facility] cell phone (if texting agreement is signed).  Sending the patient an Out of Care MyChart/letter.  Contacting the patient's most recent pharmacies as needed for additional information such as contact information or refill history.  Contacting the patient's case manager/caregiver as needed for additional information, if assigned.  Researching other online resources as needed.     If the patient is reached, the [employer] will attempt to:  Update all contact information including primary and secondary phone numbers and emergency contact details and address. Confirm access to MyChart.  Complete the Out of Care Intervention questionnaire with the patient.  Explore and discuss any barriers to visit attendance the patient may be experiencing that result in missed appointments, no-shows, cancellations, or lack of a scheduled follow-up appointment. These may include communication barriers (language, phone access, texting, MyChart), transportation, financial concerns, time off work, mental health/substance use, and/or multiple medical appointments.  Make appropriate referrals and link the patient to clinic nurses, social workers, benefits counselors, or other support services in the [facility] ID clinic to address concerns or barriers to care.  Schedule a follow-up appointment at an appropriate interval based on a completed assessment.  Confirm the patient has access to ART medications until that appointment. If the patient is not currently taking ART, needs refills, or is having difficulty taking the medication, refer to the clinical staff for follow up via [employer].    Monitor attendance of the patient's scheduled appointment and provide reminder calls/texts/messages consistent with patient preference, if needed.  Continue follow-up if indicated.    Expected Outcome:  Patient will have an office visit within 6 months of the last office visit unless stated otherwise by their provider.    If the patient has an urgent medical problem send a chat message to the clinic nursing staff for immediate follow-up     **If the patient is not able to be located or retained in HIV care, a referral will be placed to the [facility] Counselor for further follow-up.    Duration of Service: 15 minutes    Inman Fettig  Linkage and Firefighter Infectious Diseases Clinic

## 2023-01-11 NOTE — Unmapped (Unsigned)
INFECTIOUS DISEASES CLINIC  [address]  [place], [place]  [address]  P [phone]  F [phone]     Primary care provider: Jodi Marble, AGNP    Assessment/Plan:      HIV (dx'd 03/2002)  - chronic, stable  Diagnosed with PJP pneumonia at time of HIV diagnosis    Overall doing well. Current regimen: Descovy (FTC/TAF) and dolutegravir  Misses doses of ARVs never Denies any missed doses since last visit.    Med access through insurance  CD4 count  300's  Discussed ARV adherence, continuing prophylaxis for OI and taking ARVs with food    Lab Results   Component Value Date    ACD4 444 (L) 03/15/2022    CD4 37 03/15/2022    HIVRS Detected (A) 03/15/2022    HIVCP <20 (H) 03/15/2022     CD4, HIV RNA, and safety labs (full return panel)  Continue current therapy. On BIktarvy  Discussed importance of ARV adherence  Consolidating care through PCP - primary care/HIV care. Today will be last visit and will continue follow up with PCP - Jodi Marble, AGNP. I discussed this with PCP and patient and all are amenable to this consolidation of care.      Nasal congestion and cough x 2 weeks. R/o URI - acute, new  Patient reports that he has started to have ongoing nasal congestion and occasional cough with clear/whitish sputum. Denies fever, chills, face pain. Attributes symptoms to seasonal allergies.  Obtain RPP and chest x-ray to rule out pneumonia. - xray negative, RPP negative  In the mean time, continue allergy medications (Allegra and Flonase)      Sleep disturbance - chronic  For several months, has fallen asleep in front of the TV in his room but when he turns out the lights and TV he is wide awake and then finds it difficult to sleep. He mentions having racing thoughts. He admits he has had some increased stress in his life recently.  Discussed some sleep hygiene practices such as turning out electronics about an hour before he'd like to go to sleep, avoid naps during the day, stay on a sleep schedule.  He reports managing this fairly well but he is amenable to trying these measures.      Right shoulder pain - chronic, acutely exacerbated  Has chronic right shoulder pain over last 2 years. Pain has worsened the last two months or so. Manageable. Takes aspirin to manage pain with relief.  Had left rotator cuff surgery some years ago but post op recovery was difficult and he does not want to have right shoulder surgery at this time.  Patient addressed with PCP who recommends patient increase exercise. He does try to exercise by walking but gets short of breath or back pain starts. Followed by Dr. Sallyanne Kuster for COPD.      Recent presumptive treatment for PJP - acute, resolved  Sore throat, cough, chills, night sweats, decreased appetite, has really dry mouth x 4 weeks   Took full course of Atovaquone 750mg  BID x 21 days and now taking 1500mg  daily for OI prophylaxis.  Has better appetite, put on 3#. Has some wheezing at night but has been without Stiolto and albuterol.   Roommate diagnosed with CoVid at same time as his URI symptoms(03/01/21), patient tested negative for CoVid multiple times  Taking Descovy/Tivicay consistently  No longer taking Atovaquone.  Will obtain another CD4 count today       Chronic hepatitis C (  RAF-HCC)/ HCC screening - chronic, stable  Genotype 1a, Grade 1, stage II disease by liver biopsy 01/31/2003  Previously treated in 07/28/2003 to 01/20/2004 at [facility] requiring early discontinuation secondary to significant side effects including what was thought to be interstitial pneumonitis. While on therapy required addition of both Neupogen and Procrit secondary to medication-induced cytopenias.  Treated again in 02/28/15 with Sofosbuvir and daclastasvir x 12 weeks   Followed by Hepatology, Dr. Arnette Felts FNP, last seen 11/25/19  12/02/2019 MRI of abdomen: Cirrhotic hepatic morphology with mild splenomegaly (14.3cm). No ascites. No suspicious hepatic lesions.  07/12/21 Hep C RNA ART.  Mood improved, feeling good overall.   Currently on Lamictal 200mg  daily and Abilify 5mg  daily  Has appointment with Dr.Goodman 01/26/2023      Sleep disturbance - chronic, improved  For several months, has fallen asleep in front of the TV in his room but when he turns out the lights and TV he is wide awake and then finds it difficult to sleep. He mentions having racing thoughts. He admits he has had some increased stress in his life recently.  Sleeping better      Right shoulder pain - chronic, resolved  Has chronic right shoulder pain over last 2 years. Pain has worsened the last two months or so. Manageable. Takes aspirin to manage pain with relief.  Had left rotator cuff surgery some years ago but post op recovery was difficult and he does not want to have right shoulder surgery at this time.  Shoulder has not been bothering him.      History of chronic renal insufficiency  Stable Scr 1.0-1.16      Nicotine dependence  - chronic, stable  Revisit readiness at next appt. Declines today      Sexual health & secondary prevention  - chronic, stable  Not in relationship. Has not had sex since last visit.   Parts of body used during sex include: anus/rectum, mouth and penis. Versatile for anal sex. Gives and receives oral sex. Does not have vaginal sex.   Since last visit has had UNKNOWN sex and has not had add'l STI screening.  He does routinely discuss HIV status with partner(s).  Have not discussed interest in having children.    Lab Results   Component Value Date    RPR Nonreactive 07/12/2021    RPR Nonreactive 01/20/2021    CTNAA Negative 03/02/2021    CTNAA Negative 01/20/2021    CTNAA Negative 01/20/2021    GCNAA Negative 03/02/2021    GCNAA Negative 01/20/2021    GCNAA Negative 01/20/2021    SPECSOURCE Urine 03/02/2021    SPECSOURCE Throat 01/20/2021    SPECSOURCE Rectum 01/20/2021     GC/CT NAATs - not being checked routinely for this patient  RPR - for screening obtained today      Health maintenance  - not had sex since last visit.   Parts of body used during sex include: anus/rectum, mouth and penis. Versatile for anal sex. Gives and receives oral sex. Does not have vaginal sex.   Since last visit has had UNKNOWN sex and has not had add'l STI screening.  He does routinely discuss HIV status with partner(s).  Have not discussed interest in having children.    Lab Results   Component Value Date    RPR Nonreactive 07/12/2021    RPR Nonreactive 01/20/2021    CTNAA Negative 03/02/2021    CTNAA Negative 01/20/2021    CTNAA Negative 01/20/2021    GCNAA  Negative 03/02/2021    GCNAA Negative 01/20/2021    GCNAA Negative 01/20/2021    SPECSOURCE Urine 03/02/2021    SPECSOURCE Throat 01/20/2021    SPECSOURCE Rectum 01/20/2021     GC/CT NAATs - not being checked routinely for this patient  RPR - repeat 12 months after prior, due 06/2022      Health maintenance  - chronic, stable  PCP: Jodi Marble, AGNP at [facility].    Oral health  He does not have a dentist. Last dental exam unknown.    Eye health  He does  use corrective lenses. Last eye exam unknown.    Metabolic conditions  Wt Readings from Last 5 Encounters:   10/24/22 93.3 kg (205 lb 9.6 oz)   10/18/22 93.4 kg (206 lb)   07/04/22 92.1 kg (203 lb)   05/26/22 94.3 kg (208 lb)   03/17/22 94.7 kg (208 lb 12.8 oz)     Lab Results   Component Value Date    CREATININE 1.09 10/18/2022    PROTEINUR 58.0 03/25/2020    PCRATIOUR 306 03/25/2020    GLU 101 (H) 10/18/2022    A1C 4.8 07/12/2021    ALT 9 (L) 10/18/2022    ALT 11 03/15/2022    ALT 11 03/15/2022    VITDTOTAL 31.8 03/17/2022     # Kidney health - creatinine today  # Bone health -  Last DEXA 07/2018  and FRAX score Major osteoporotic 6.1%/Hip Fracture 1.3% on this date: 05/04/2021  T score -1.0, repeat in 5 years 04/2026  # Diabetes assessment - random glucose today  # NAFLD assessment -  Needs to follow back up with Hepatology    Communicable diseases  Lab Results   Component Value Date QFTTBGOLD Negative 07/12/2021    HEPAIGG Reactive (A) 05/21/2015    HEPBSAB Reactive 01/10/2015    HEPCAB Positive (A) 01/10/2015    HCVRNA Not Detected 07/12/2021    HCVRNAIU 16 03/19/2015    HCVIU [id number] 07/29/2014     # TB screening - assessment needed but deferred to future visit  # Hepatitis screening -  Treated  # MMR screening - not assessed    Cancer screening  Lab Results   Component Value Date    PSA 0.22 07/12/2021    FINALDX  12/22/2021     Cecum with transverse and descending colon, biopsy:  -Tubular adenoma, multiple fragments.    This electronic signature is attestation that the pathologist personally reviewed the submitted material(s) and the final diagnosis reflects that evaluation.       # Anorectal - not yet done  # Colorectal -  12/22/21  # Liver -  Defer to Hepatology  # Lung - repeat low-dose CT 35m from prior, managed by Pulmonology  # Prostate -  Repeat in 2 years, due 06/2023    Cardiovascular disease  Lab Results   Component Value Date    CHOL 123 03/25/2020    HDL 66 03/25/2020    LDL 41 03/25/2020    NONHDL 103 10/04/2011    TRIG 79 03/25/2020     # The 10-year ASCVD risk score (Arnett DK, et al., 2019) is: 14.7%  - is not taking aspirin   - is taking statin  - BP control fair  - current smoker  # AAA screening - ultrasound ordered    Immunization History   Administered Date(s) Administered    COVID-19 VAC,BIVALENT(50YR UP),PFIZER 07/12/2021    COVID-19 VAC,BIVALENT,MODERNA(BLUE CAP) 03/15/2022  COVID-19 VACC,MRNA(BOOSTER)OR(6-64YR)MODERNA 03/18/2021    COVID-19 VACCINE,MRNA(MODERNA)(PF) 12/12/2019, 01/07/2020, 09/03/2020    Covid-19 Vac, (35yr+) (Spikevax) Monovalent Xbb.1.5 Moder  09/26/2022    HEPATITIS B VACCINE ADULT,IM(ENERGIX B, RECOMBIVAX) 04/17/2012, 07/17/2012, 01/29/2013    INFLUENZA TIV (TRI) PF (IM) 08/21/2007, 07/29/2008, 09/29/2009, 07/06/2010, 10/04/2011, 07/17/2012, 08/20/2013    Influenza Vaccine Quad(IM)6 MO-Adult(PF) 07/29/2014, 08/20/2015, 06/28/2016, 08/02/2017, 07/31/2018, 10/22/2019, 07/06/2020, 07/12/2021, 07/04/2022    Influenza Virus Vaccine, unspecified formulation 07/29/2014, 08/20/2015, 06/28/2016, 08/02/2017    Novel Influenza-h1n1-09, All Formulations 11/04/2008    PNEUMOCOCCAL POLYSACCHARIDE 23-VALENT 11/25/2003, 02/17/2009, 01/12/2016    Pneumococcal Conjugate 13-Valent 10/23/2012    Pneumococcal Conjugate 20-valent 10/24/2022    Pneumococcal Conjugate, Unspecified Formulation 10/23/2012    RSV VACCINE, BIVALENT (PF) (ABRYSVO) 10/18/2022    TdaP 02/22/2006, 06/19/2013, 02/17/2022     Immunizations today -  Shingrix needed , prefers to obtain with PCP.  CoVid BV booster given today.    I personally spent 45 minutes face-to-face and non-face-to-face in the care of this patient, which includes all pre, intra, and post visit time on the date of service.  All documented time was specific to the E/M visit and does not include any procedures that may have been performed.      Disposition  Next appointment: transferring care to PCP      To do @ next RTC  Seeing GI/Hepatology  Shingrix with PCP    Varney Daily, FNP-BC  [facility] Infectious Diseases Clinic at [facility]  [address], [place], [place] [address]    Phone: [phone]   Fax: [phone]             Subjective      Chief Complaint   HIV follow up    HPI  In addition to details in A&P above:  No missed ART doses.  Denies any fever, chills, nausea, vomiting, rash, urinary complaints, diarrhea, constipation.  Has a cat that gave birth on their back porch, fostering till they can meet with animal services next week.      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    Reflux     Vomiting after eating         Social History  Background - Lives with roommate, has been socializing a little bit more    Housing - in house with roommate  School / Work & Benefits - on [employer]    Social History     Tobacco Use    Smoking status: Every Day     Current packs/day: 0.50     Average packs/day: 0.5 packs/day for 52.9 years (26.5 ttl pk-yrs)     Types: Cigarettes     Start date: 01/29/1970    Smokeless tobacco: Never    Tobacco comments:     10 cigarettes/day   Vaping Use    Vaping status: Never Used   Substance Use Topics    Alcohol use: Not Currently     Comment: has not had anything to drink in 2 months    Drug use: Yes     Frequency: 2.0 times per week     Types: Marijuana     Comment: weekly use ~  2 times week. last used 2 days ago       Review of Systems  As per HPI. All others negative.      Medications and Allergies  He has a current medication list which includes the following prescription(s): albuterol, aripiprazole, atorvastatin, calcium 600 + d(3), tivicay, descovy, fluticasone propionate, ipratropium, lamotrigine, losartan, losartan, naproxen sodium, trazodone, and umeclidinium-vilanterol.    Allergies: Pollen extracts and Sulfa (sulfonamide antibiotics)      Family History  His family history includes Hepatitis in his sister; Parkinsonism in his sister; Rheumatologic disease in his sister. He was adopted.          Objective:      There were no vitals taken for this visit.  Wt Readings from Last 3 Encounters:   10/24/22 93.3 kg (205 lb 9.6 oz)   10/18/22 93.4 kg (206 lb)   07/04/22 92.1 kg (203 lb)       Const looks well and attentive, alert, appropriate   Eyes sclerae anicteric, noninjected OU   ENT no thrush, leukoplakia or oral lesions   Lymph no cervical or supraclavicular LAD   CV RRR. No murmurs. No rub or gallop. S1/S2. Very distant heart tones,    Lungs CTAB ant/post, normal work of breathing, barrel chest   GI Soft, no organomegaly. NTND. NABS.   GU deferred   Rectal deferred   Skin no petechiae, ecchymoses or obvious rashes on clothed exam   MSK no joint tenderness and normal ROM throughout   Neuro grossly intact   Psych Appropriate affect. Eye contact good. Linear thoughts. Fluent speech.     Laboratory Data  Reviewed in Epic today, using Synopsis and Chart Review filters.    Lab Results   Component Value Date    CREATININE 1.09 10/18/2022    QFTTBGOLD Negative 07/12/2021    HEPCAB Positive (A) 01/10/2015    HCVRNA Not Detected 07/12/2021    HCVRNAIU 16 03/19/2015    HCVIU [id number] 07/29/2014    CHOL 123 03/25/2020    HDL 66 03/25/2020    LDL 41 03/25/2020    NONHDL 103 10/04/2011    TRIG 79 03/25/2020    PSA 0.22 07/12/2021    A1C 4.8 07/12/2021    FINALDX  12/22/2021     Cecum with transverse and descending colon, biopsy:  -Tubular adenoma, multiple fragments.    This electronic signature is attestation that the pathologist personally reviewed the submitted material(s) and the final diagnosis reflects that evaluation.

## 2023-01-12 ENCOUNTER — Ambulatory Visit: Admit: 2023-01-12 | Discharge: 2023-01-13 | Payer: MEDICARE | Attending: Family | Primary: Family

## 2023-01-12 DIAGNOSIS — Z9189 Other specified personal risk factors, not elsewhere classified: Principal | ICD-10-CM

## 2023-01-12 DIAGNOSIS — Z113 Encounter for screening for infections with a predominantly sexual mode of transmission: Principal | ICD-10-CM

## 2023-01-12 DIAGNOSIS — Z79899 Other long term (current) drug therapy: Principal | ICD-10-CM

## 2023-01-12 DIAGNOSIS — B182 Chronic viral hepatitis C: Secondary | ICD-10-CM | POA: Diagnosis not present

## 2023-01-12 DIAGNOSIS — B20 Human immunodeficiency virus [HIV] disease: Secondary | ICD-10-CM | POA: Diagnosis not present

## 2023-01-12 DIAGNOSIS — Z5181 Encounter for therapeutic drug level monitoring: Secondary | ICD-10-CM | POA: Diagnosis not present

## 2023-01-12 LAB — CBC W/ AUTO DIFF
BASOPHILS ABSOLUTE COUNT: 0.1 10*9/L (ref 0.0–0.1)
BASOPHILS RELATIVE PERCENT: 1.3 %
EOSINOPHILS ABSOLUTE COUNT: 0.1 10*9/L (ref 0.0–0.5)
EOSINOPHILS RELATIVE PERCENT: 1.1 %
HEMATOCRIT: 38.9 % — ABNORMAL LOW (ref 39.0–48.0)
HEMOGLOBIN: 13.2 g/dL (ref 12.9–16.5)
LYMPHOCYTES ABSOLUTE COUNT: 0.7 10*9/L — ABNORMAL LOW (ref 1.1–3.6)
LYMPHOCYTES RELATIVE PERCENT: 14.6 %
MEAN CORPUSCULAR HEMOGLOBIN CONC: 34 g/dL (ref 32.0–36.0)
MEAN CORPUSCULAR HEMOGLOBIN: 33.7 pg — ABNORMAL HIGH (ref 25.9–32.4)
MEAN CORPUSCULAR VOLUME: 99.1 fL — ABNORMAL HIGH (ref 77.6–95.7)
MEAN PLATELET VOLUME: 7.5 fL (ref 6.8–10.7)
MONOCYTES ABSOLUTE COUNT: 0.4 10*9/L (ref 0.3–0.8)
MONOCYTES RELATIVE PERCENT: 7.5 %
NEUTROPHILS ABSOLUTE COUNT: 3.7 10*9/L (ref 1.8–7.8)
NEUTROPHILS RELATIVE PERCENT: 75.5 %
PLATELET COUNT: 153 10*9/L (ref 150–450)
RED BLOOD CELL COUNT: 3.92 10*12/L — ABNORMAL LOW (ref 4.26–5.60)
RED CELL DISTRIBUTION WIDTH: 13.3 % (ref 12.2–15.2)
WBC ADJUSTED: 4.9 10*9/L (ref 3.6–11.2)

## 2023-01-12 LAB — BASIC METABOLIC PANEL
ANION GAP: 8 mmol/L (ref 5–14)
BLOOD UREA NITROGEN: 15 mg/dL (ref 9–23)
BUN / CREAT RATIO: 15
CALCIUM: 9.7 mg/dL (ref 8.7–10.4)
CHLORIDE: 107 mmol/L (ref 98–107)
CO2: 25 mmol/L (ref 20.0–31.0)
CREATININE: 0.99 mg/dL
EGFR CKD-EPI (2021) MALE: 83 mL/min/{1.73_m2} (ref >=60)
GLUCOSE RANDOM: 157 mg/dL (ref 70–179)
POTASSIUM: 3.4 mmol/L (ref 3.4–4.8)
SODIUM: 140 mmol/L (ref 135–145)

## 2023-01-12 LAB — ALT: ALT (SGPT): <7 U/L — ABNORMAL LOW (ref 10–49)

## 2023-01-12 LAB — AST: AST (SGOT): 15 U/L (ref <=34)

## 2023-01-12 LAB — HIV RNA, QUANTITATIVE, PCR
HIV RNA QNT RSLT: DETECTED — AB
HIV RNA: <20 {copies}/mL — ABNORMAL HIGH (ref <0)

## 2023-01-12 LAB — BILIRUBIN, TOTAL: BILIRUBIN TOTAL: 0.5 mg/dL (ref 0.3–1.2)

## 2023-01-12 LAB — HEPATITIS C RNA, QUANTITATIVE, PCR: HCV RNA: NOT DETECTED

## 2023-01-12 MED ORDER — TIVICAY 50 MG TABLET
ORAL_TABLET | Freq: Every day | ORAL | 6 refills | 30 days | Status: CP
Start: 2023-01-12 — End: ?
  Filled 2023-01-17: qty 30, 30d supply, fill #0

## 2023-01-12 MED ORDER — DESCOVY 200 MG-25 MG TABLET
ORAL_TABLET | Freq: Every day | ORAL | 6 refills | 30 days | Status: CP
Start: 2023-01-12 — End: ?
  Filled 2023-01-17: qty 30, 30d supply, fill #0

## 2023-01-12 NOTE — Unmapped (Signed)
COVID Education:  Make sure you perform good hand washing (lasting 20 seconds), continue to social distance and limit close personal contact (which may include new sexual partners or having multiple partners during this period).  Try to isolate at home but please find ways to keep in touch with those close to you, such as meeting up with them electronically or socially distanced, and the ability to go outdoors alone or separated from others  If you become ill with fever, respiratory illness, sudden loss of taste and smell, stomach issues, diarrhea, nausea, vomiting - contact clinic for further instructions.  You should go to the emergency department if you develop systems such as shortness of breath, confusion, lightheadedness when standing, high fever.   Here is some information about HIV and CoVid vaccines: [email or web address]  If you're interested in receiving the CoVid vaccine when you're eligible, here are some resources for you to check and make an appointment:  Your local health department   [email or web address] through [facility]  [email or web address]  [facility] (if you are an established patient with them)  [email or web address]    URGENT CARE  Please call ahead to speak with the nursing staff if you are in need of an urgent appointment.       MEDICATIONS  For refills please contact your pharmacy and ask them to electronically send or fax the request to the clinic.   Please bring all medications in original bottles to every appointment.    HMAP (formerly ADAP) or [employer] Eligibility (required even if you do not receive medication through [facility])  Please remember to renew your [facility] eligibility during renewal periods which occur twice a year: January-March and July-September.     The following are needed for each renewal:   - [facility] Identification (if you don't have one, then a bill with your name and address in [place])   - proof of income (award letter, W-2, or last three check stubs)   If you are unable to come in for renewal, let us know if we can mail, fax or e-mail paperwork to you.   HMAP Contact: [phone].     Lab info:  Your most recent CD4 T-cell counts and viral loads are below. Here are a few things to keep in mind when looking at your numbers:  Our goal is to get your virus to be undetectable and keep it undetectable. If the virus is undetectable you are much more likely to stay healthy.  We consider your viral load to be undetectable if it says <40 or if it says Not detected.  For most people, we're checking CD4 counts every other visit (once or twice a year, or sometimes even less).  It's normal for your CD4 count to be different from visit to visit.   You can help by taking your medications at about the same time, every single day. If you're having trouble with taking your medications, it's important to let us know.    Lab Results   Component Value Date    ACD4 444 (L) 03/15/2022    CD4 37 03/15/2022    HIVCP <20 (H) 03/15/2022    HIVRS Detected (A) 03/15/2022        Please note that your laboratory and other results may be visible to you in real time, possibly before they reach your provider. Please allow 48 hours for clinical interpretation of these results. Importantly, even if a result is flagged as abnormal, it may not be one that  impacts your health.    It was nice to have a visit with you today!  Follow-up information:        Provider today:  Varney Daily, FNP-BC      ID CLINIC address:   [facility] Infectious Diseases Clinic at [facility]  [address]  [place], [place] [address]    Contact information:    The ID clinic phone number is [phone]   The ID clinic fax number is [phone]  For urgent issues on nights and weekends: Call the ID Physician on-call through the [facility] Operator at [phone].    Please sign up for My [facility] Chart - This is a great way to review your labs and track your appointments    Please try to arrive 30 minutes BEFORE your scheduled appointment time!  This will give you time to fill out any front desk paperwork needed for your visit, and allow you to be seen as close to your scheduled appointment time as possible.

## 2023-01-12 NOTE — Unmapped (Signed)
Per provider, patient is now receiving care at [facility] (as of 02/2022) facility for HIV management and will not be routinely followed by the [facility] Infectious Diseases Clinic at [facility].     Duration of Service: 5 minutes    Kloe Oates  Linkage and Retention Coordinator  [facility] Infectious Diseases Clinic

## 2023-01-12 NOTE — Unmapped (Signed)
Started assessment with patient options: in clinic      Patient declined RW services at this time.     Housing Status  Stable/Permanent    Insurance  Medicare Part C    Reason for Declining: patient want to transfer care to [place] for treatment.        Madinah Cathcart-Rowe  Benefits Counselor  Time of Intervention: 2 mins

## 2023-01-13 LAB — LYMPH MARKER LIMITED,FLOW
ABSOLUTE CD3 CNT: 546 {cells}/uL — ABNORMAL LOW (ref 915–3400)
ABSOLUTE CD4 CNT: 252 {cells}/uL — ABNORMAL LOW (ref 510–2320)
ABSOLUTE CD8 CNT: 308 {cells}/uL (ref 180–1520)
CD3% (T CELLS): 78 % (ref 61–86)
CD4% (T HELPER): 36 % (ref 34–58)
CD4:CD8 RATIO: 0.8 — ABNORMAL LOW (ref 0.9–4.8)
CD8% T SUPPRESR: 44 % — ABNORMAL HIGH (ref 12–38)

## 2023-01-13 LAB — SYPHILIS SCREEN: SYPHILIS RPR SCREEN: NONREACTIVE

## 2023-01-16 NOTE — Unmapped (Signed)
[facility] Refill Coordination Note    Specialty Medication(s) to be Shipped:   Infectious Disease: Descovy and Tivicay    Other medication(s) to be shipped: No additional medications requested for fill at this time     Andrew Oconnell, DOB: 25-Sep-1956  Phone: [phone] (home)       All above HIPAA information was verified with patient.     Was a Nurse, learning disability used for this call? No    Completed refill call assessment today to schedule patient's medication shipment from the [facility] [phone]).  All relevant notes have been reviewed.     Specialty medication(s) and dose(s) confirmed: Regimen is correct and unchanged.   Changes to medications: Andrew Oconnell reports no changes at this time.  Changes to insurance: No  New side effects reported not previously addressed with a pharmacist or physician: None reported  Questions for the pharmacist: No    Confirmed patient received a Conservation officer, historic buildings and a Surveyor, mining with first shipment. The patient will receive a drug information handout for each medication shipped and additional FDA Medication Guides as required.       DISEASE/MEDICATION-SPECIFIC INFORMATION        N/A    SPECIALTY MEDICATION ADHERENCE     Medication Adherence    Patient reported X missed doses in the last month: 0  Specialty Medication: descovy 200-25mg   Patient is on additional specialty medications: Yes  Additional Specialty Medications: tivicay 50mg   Patient Reported Additional Medication X Missed Doses in the Last Month: 0              Were doses missed due to medication being on hold? No    tivicay 50mg  and descovy 200-25mg  :  unable to confirm quantity on hand    REFERRAL TO PHARMACIST     Referral to the pharmacist: Not needed      [facility]     Shipping address confirmed in Epic.     Patient was notified of new phone menu : Yes    Delivery Scheduled: Yes, Expected medication delivery date: 4/3.     Medication will be delivered via UPS to the prescription address in Epic WAM.    Westley Gambles   [facility] Specialty Technician

## 2023-01-17 MED FILL — ANORO ELLIPTA 62.5 MCG-25 MCG/ACTUATION POWDER FOR INHALATION: RESPIRATORY_TRACT | 30 days supply | Qty: 60 | Fill #2

## 2023-01-25 ENCOUNTER — Ambulatory Visit: Admit: 2023-01-25 | Discharge: 2023-01-26 | Payer: MEDICARE

## 2023-01-25 DIAGNOSIS — I2584 Coronary atherosclerosis due to calcified coronary lesion: Secondary | ICD-10-CM | POA: Diagnosis not present

## 2023-01-25 DIAGNOSIS — R911 Solitary pulmonary nodule: Secondary | ICD-10-CM | POA: Diagnosis not present

## 2023-01-25 DIAGNOSIS — Z122 Encounter for screening for malignant neoplasm of respiratory organs: Secondary | ICD-10-CM | POA: Diagnosis not present

## 2023-01-25 DIAGNOSIS — J432 Centrilobular emphysema: Secondary | ICD-10-CM | POA: Diagnosis not present

## 2023-01-25 DIAGNOSIS — F17210 Nicotine dependence, cigarettes, uncomplicated: Secondary | ICD-10-CM | POA: Diagnosis not present

## 2023-01-26 DIAGNOSIS — R911 Solitary pulmonary nodule: Principal | ICD-10-CM

## 2023-01-30 ENCOUNTER — Ambulatory Visit: Admit: 2023-01-30 | Discharge: 2023-01-31 | Payer: MEDICARE

## 2023-01-30 DIAGNOSIS — Z720 Tobacco use: Principal | ICD-10-CM

## 2023-01-30 DIAGNOSIS — I871 Compression of vein: Principal | ICD-10-CM

## 2023-01-30 DIAGNOSIS — J309 Allergic rhinitis, unspecified: Principal | ICD-10-CM

## 2023-01-30 DIAGNOSIS — F17210 Nicotine dependence, cigarettes, uncomplicated: Secondary | ICD-10-CM | POA: Diagnosis not present

## 2023-01-30 DIAGNOSIS — J449 Chronic obstructive pulmonary disease, unspecified: Secondary | ICD-10-CM | POA: Diagnosis not present

## 2023-01-30 DIAGNOSIS — Z79899 Other long term (current) drug therapy: Secondary | ICD-10-CM | POA: Diagnosis not present

## 2023-01-30 DIAGNOSIS — J329 Chronic sinusitis, unspecified: Secondary | ICD-10-CM | POA: Diagnosis not present

## 2023-01-30 DIAGNOSIS — K219 Gastro-esophageal reflux disease without esophagitis: Secondary | ICD-10-CM | POA: Diagnosis not present

## 2023-01-30 DIAGNOSIS — E7800 Pure hypercholesterolemia, unspecified: Secondary | ICD-10-CM | POA: Diagnosis not present

## 2023-01-30 DIAGNOSIS — R0609 Other forms of dyspnea: Secondary | ICD-10-CM | POA: Diagnosis not present

## 2023-01-30 DIAGNOSIS — Z882 Allergy status to sulfonamides status: Secondary | ICD-10-CM | POA: Diagnosis not present

## 2023-01-30 DIAGNOSIS — B20 Human immunodeficiency virus [HIV] disease: Secondary | ICD-10-CM | POA: Diagnosis not present

## 2023-01-30 DIAGNOSIS — J432 Centrilobular emphysema: Secondary | ICD-10-CM | POA: Diagnosis not present

## 2023-01-30 DIAGNOSIS — Z7951 Long term (current) use of inhaled steroids: Secondary | ICD-10-CM | POA: Diagnosis not present

## 2023-01-30 DIAGNOSIS — J439 Emphysema, unspecified: Secondary | ICD-10-CM | POA: Diagnosis not present

## 2023-01-30 DIAGNOSIS — Z575 Occupational exposure to toxic agents in other industries: Secondary | ICD-10-CM | POA: Diagnosis not present

## 2023-01-30 DIAGNOSIS — R9389 Abnormal findings on diagnostic imaging of other specified body structures: Secondary | ICD-10-CM | POA: Diagnosis not present

## 2023-01-30 NOTE — Unmapped (Signed)
HIV-PULMONARY CLINIC VISIT:    Patient: Andrew Oconnell([birth year])   Reason for visit: COPD    HISTORY OF PRESENT ILLNESS:     Andrew Oconnell is a 67 year old gentleman with a PMH as below including HIV on ART, tobacco use and COPD.     HISTORY OF PRESENT ILLNESS (06/2020):   Andrew Oconnell reports that he was first diagnosed with COPD in 2005. He had previously been seeing a pulmonologist in [place] and has been on Advair with prn albuterol for a few years though he ran out of the Advair a few months ago. He reports that he has had some dyspnea on exertion for the past few months and reports an mMRC score of 2 today. He uses his rescue inhaler 2-3 times per week. He denies any cough but does endorse morning sputum production that is typically clear and never has blood. He denies any wheezing or chest tightness. He has chronic rhinosinusitis, for which he follows with ENT - currently managed with sinus rinses and oral antihistamines. He denies any fevers or chills and reports occasional mild night sweats that have been stable for years. He reports weight gain of 3 pounds in the last month. He denies any lower extremity swelling. He has not required antibiotics or oral steroids in the last 2-3 years for a COPD exacerbation.    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 He reports that his respiratory symptoms remain very well controlled. His mMRC score today is 1.     INTERVAL HISTORY (11/2021):  His mMRC score is 1 today and he is presently using his albuterol inhaler 1-2 times a month, with relief. He saw ENT in November who prescribed nasal hygeine - he has been doing the nasal spray mostly consistently.     INTERVAL HISTORY:  Andrew Oconnell continues to report feeling overall well. His mMRC score today is 1. He denies any cough and minimal DOE. His allergic rhinitis symptoms continue wax and wane with Flonase and Claritin daily. He is using Atrovent inconsistently and disliked sinus irrigation recommended by ENT. He reports that he is compliant with his Stiolto and uses his albuterol rescue inhaler at most, once a week.       INTERVAL HISTORY (10/2022):   Andrew Oconnell was admitted for a week at [facility] in November for a COPD exacerbation. He did not require the ICU but did require oxygen during his stay, though not at discharge. He was treated with antibiotics and steroids. He denies any obvious trigger for the exacerbation but was feeling run down prior to presenting. This was his first hospitalization since 2019 and he has not had any other exacerbations in the past year. He reports that since discharge, he has been feeling at his baseline - his mMRC score is 1 today, he denies any cough, wheeze, or chest tightness. He remains on his Stiolto and has not required his rescue inhaler in weeks.     INTERVAL HISTORY (01/2023):   Andrew Oconnell reports continuing to feel very well with no complaints. He reports very rare cough productive of scant, clear sputum. His mMRC score is 1 today. He denies any lightheadedness, dizziness, chest pain, orthopnea, lower extremity edema, facial plethora, or paroxysmal nocturnal dyspnea. He is compliant with his Stiolto daily and has not required his albuterol inhaler in months. His allergic rhinitis symptoms are at baseline.  Medication Adherence: Adherent all of the time.  Inhaler Technique: Using inhaler properly.    Pulmonary medications: Stiolto, prn Albuterol    HIV status:  Date: CD4 Viral load   10/2018 543 ND   10/2019 593 ND   03/2020  <40   04/2021 385 ND   09/2021 429 <40   02/2022 444 <20   12/2022 252 <20     Current regimen: Tivicay and Descovy    REVIEW OF SYSTEMS: All systems were reviewed and found to be negative except as above in the HPI.    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    Reflux     Vomiting     after eating     Past Surgical History:   Procedure Laterality Date    BACK SURGERY N/A 1990    LIGATION / DIVISION SAPHENOUS VEIN Bilateral     90s    PR COLONOSCOPY W/BIOPSY SINGLE/MULTIPLE  12/22/2021    Procedure: COLONOSCOPY, FLEXIBLE, PROXIMAL TO SPLENIC FLEXURE; WITH BIOPSY, SINGLE OR MULTIPLE;  Surgeon: Luanne Bras, MD;  [facility];  Service: Gastroenterology    PR COLSC FLX W/RMVL OF TUMOR POLYP LESION SNARE TQ N/A 05/08/2015    Procedure: COLONOSCOPY FLEX; W/REMOV TUMOR/LES BY SNARE;  Surgeon: Teodoro Spray, MD;  [facility]: [facility];  Service: Gastroenterology    PR COLSC FLX W/RMVL OF TUMOR POLYP LESION SNARE TQ Left 12/22/2021    Procedure: COLONOSCOPY FLEX; W/REMOV TUMOR/LES BY SNARE;  Surgeon: Luanne Bras, MD;  Location: [facility];  Service: Gastroenterology    PR ESOPHAGEAL MOTILITY STUDY, MANOMETRY N/A 06/04/2014    Procedure: ESOPHAGEAL MOTILITY STUDY W/INT & REP;  Surgeon: Nurse-Based Giproc;  [facility]: [facility]URES [facility];  Service: Gastroenterology    PR REPAIR BICEPS LONG TENDON Left 03/11/2016    Procedure: TENODESIS LONG TENDON BICEPS;  Surgeon: Tomasa Rand, MD;  Location: ASC OR [facility];  Service: Orthopedics    PR SHLDR ARTHROSCOP,SURG,W/ROTAT CUFF REPR Left 03/11/2016    Procedure: ARTHROSCOPY, SHOULDER, SURGICAL; WITH ROTATOR CUFF REPAIR;  Surgeon: Tomasa Rand, MD;  Location: ASC OR [facility];  Service: Orthopedics    PR SURGICAL ARTHROSCOPY SHOULDER XTNSV DBRDMT 3+ Left 03/11/2016    Procedure: ARTHROSCOPY SHOULDER SURG; DEBRID EXTEN;  Surgeon: Tomasa Rand, MD;  Location: ASC OR [facility];  Service: Orthopedics    PR UPPER GI ENDOSCOPY,BIOPSY N/A 05/20/2014    Procedure: UGI ENDOSCOPY; WITH BIOPSY, SINGLE OR MULTIPLE;  Surgeon: Donneta Romberg, MD;  Location: GI PROCEDURES [facility];  Service: Gastroenterology    PR UPPER GI ENDOSCOPY,BIOPSY N/A 10/12/2018    Procedure: UGI ENDOSCOPY; WITH BIOPSY, SINGLE OR MULTIPLE;  Surgeon: Janyth Pupa, MD;  Location: GI PROCEDURES [facility];  Service: Gastroenterology    PR UPPER GI ENDOSCOPY,DIAGNOSIS N/A 11/06/2015    Procedure: UGI ENDO, INCLUDE ESOPHAGUS, STOMACH, & DUODENUM &/OR JEJUNUM; DX W/WO COLLECTION SPECIMN, BY BRUSH OR WASH;  Surgeon: Rona Ravens, MD;  Location: GI PROCEDURES [facility];  Service: Gastroenterology    PR UPPER GI ENDOSCOPY,DIAGNOSIS N/A 01/31/2020  Procedure: UGI ENDO, INCLUDE ESOPHAGUS, STOMACH, & DUODENUM &/OR JEJUNUM; DX W/WO COLLECTION SPECIMN, BY BRUSH OR WASH;  Surgeon: Annie Paras, MD;  [facility]: GI PROCEDURES [facility];  Service: Gastroenterology       SOCIAL AND FAMILY HISTORY:   Social history: He lives at home with a friend and they have a Development worker, international aid and cat. He is currently retired but previously worked for [employer]. 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 He is a current smoker and started at the age of 1. He has smoked on average 0.5 packs per day (current 25 pack-year history). He also reports inhaled cannabis use a few times a week. He is smoking 10 cpd. He is currently using Cannabis 1-2 month.     Family History: Adopted, no known history.    MEDICATIONS AND ALLERGIES:     CURRENT MEDICATIONS:  Outpatient Encounter Medications as of 01/30/2023   Medication Sig Dispense Refill    albuterol HFA 90 mcg/actuation inhaler Inhale 1 puff every six (6) hours as needed for wheezing or shortness of breath. 8.5 g 0    aripiprazole (ABILIFY) 5 MG tablet Take 1 tablet (5 mg total) by mouth daily. 90 tablet 0    atorvastatin (LIPITOR) 10 MG tablet TAKE 1 TABLET BY MOUTH ONCE DAILY FOR HIGH CHOLESTEROL 90 tablet 2    calcium carbonate-vitamin D3 (CALCIUM 600 + D,3,) 600 mg-5 mcg (200 unit) cap Take 1 capsule by mouth once a day either take with FOOD and your Tivicay+Descovy or 6 hours after Tivicay+descovy 100 capsule 11    dolutegravir (TIVICAY) 50 mg TABLET Take 1 tablet (50 mg total) by mouth daily. 30 tablet 6    emtricitabine-tenofovir alafen (DESCOVY) 200-25 mg tablet Take 1 tablet by mouth daily. 30 tablet 6    fluticasone (FLONASE) 50 mcg/actuation nasal spray 1 spray by Each Nare route daily. 16 g 0    ipratropium (ATROVENT) 42 mcg (0.06 %) nasal spray Use 2 sprays into each nostril four (4) times a day. 15 mL 2    lamoTRIgine (LAMICTAL) 200 MG tablet Take 1 tablet (200 mg total) by mouth daily. 90 tablet 3    losartan (COZAAR) 25 MG tablet Take 1 tablet by mouth once a day 90 tablet 1    losartan (COZAAR) 25 MG tablet Take 1 tablet by mouth once a day 90 tablet 3    naproxen sodium (ALEVE) 220 MG tablet Take 1 tablet (220 mg total) by mouth daily as needed for pain.      traZODone (DESYREL) 100 MG tablet Take 2 and 1/2  tablets (250 mg total) by mouth nightly. 225 tablet 1    umeclidinium-vilanterol (ANORO ELLIPTA) 62.5-25 mcg/actuation inhaler Inhale 1 puff daily. 180 each 3     No facility-administered encounter medications on file as of 01/30/2023.       ALLERGIES:  Allergies as of 01/30/2023 - Reviewed 01/16/2023   Allergen Reaction Noted    Pollen extracts  03/09/2017    Sulfa (sulfonamide antibiotics) Rash 01/29/2013       PHYSICAL EXAM:     Vitals:    01/30/23 1339   BP: 126/70   BP Site: L Arm   BP Position: Sitting   BP Cuff Size: Large   Pulse: 83   Temp: 36.6 ??C (97.8 ??F)   TempSrc: Tympanic   Weight: 90.7 kg (200 lb)   Height: 195.6 cm (6' 5)  Body mass index is 23.72 kg/m??.    GENERAL: well nourished, cooperative, no acute distress  EYES: anicteric, noninjected, EOMi  HEENT: Neck supple, trachea midline, moist mucus membranes.  CV: Regular rate, normal rhythm, no murmurs/rubs/gallops, no JVD.  PULM: Clear to auscultation bilaterally but diminished breath sounds at bases bilaterally. Normal excursion. Normal work of breathing.   ABD: Soft, nontender, nondistended.   EXTREMITIES: No digital clubbing. No edema.  SKIN: No rashes, lesions, or skin breakdown. Warm and well perfused.  NEURO: No focal neurologic deficits. Moves all extremities and follows commands.   PSYCH: Well groomed, appropriate mood and affect, good eye contact.       ANCILLARY DATA:     All imaging and labs were reviewed personally.    Date: FVC (% Pred) FEV1 (% Pred)  Pre-BD FEV1 (%Pred)  Post- BD FEV1/FVC DLCO (% Pred) VC (% Pred) TLC (% Pred) RV (%Pred)   August 2012 6.34 (107%) 3.10 (69%) 3.29 (73%) 49 (76) 54%      January 2022 6.49 (117%) 2.91 (70%) 2.92 (71%) 45 (63) 79%      April 2024 6.05 (114%) 3.01 (77%)  50 (62) 44%        CT LCS (11/2019): Non-calcified RML 0.4 cm, Lung-RADS 2.  CT LCS (12/2020): 0.4 cm RML nodule unchanged. New 0.3 LUL nodule. Lung-RADS 2.  CT LCS (01/2022): Severe emphysema with large R anterior bulla. Stable 4 mm RML nodule.   CT LCS (01/2023): Severe centrilobular emphysema. Large RUL bullous lesion, per radiology read with mass effect to the mediastinum. Interval increase in size of subpleural consolidation in the superior segment of RLL though on my read, I cannot visualize it clearly on prior CT images.     CT sinus (11/2019): Moderate degree of mucosal thickening in the ethmoidal sinuses with mild mucosal thickening seen in the rest of the sinuses. Small right maxillary sinus noted.   IgE (06/2020): 114      ASSESSMENT AND PLAN:     Andrew Oconnell is a 67 year old gentleman with a PMH as below including HIV on ART, tobacco use and group IIE COPD.     His hospitalization in November 2023 for a COPD exacerbation qualifies his COPD as GOLD stage IIE but his symptoms remains very well controlled with his current LAMA/LABA and he very rarely requires his SABA inhaler. His FEV1 demonstrates overall stability of his lung function today though his DLCO is significantly reduced today compared to PFTs from 2022. However, I suspect that the DLCO in 2022 was falsely elevated as it was surprisingly normal given the degree of emphysema (and the very large RUL bullae on imaging at that time) so I suspect the DLCO today is much more accurately reflective of his degree of emphysema.     His recent screening CT did describe mass effect of the large RUL bullae on the mediastinum though he demonstrates no signs or symptoms of obstruction or compression today. I would like to get an echocardiogram to further evaluate.     We continue to discuss the importance of tobacco cessation particularly on preventing continued lung function decline but he is not interested today. His allergic rhinitis remains under good control at present.  He is engaged in lung cancer screening but his most recent screening CT from earlier this month demonstrated a RLL subpleural ground glass nodule which we will re-evaluate at a shorter interval. He is already scheduled for July 2024.    He is up to  date on his influenza, COVID, TdaP, PSV-20, and RSV vaccines.     I will have him follow up in 6 months.    I personally spent 28 minutes face-to-face and non-face-to-face in the care of this patient, which includes all pre, intra, and post visit time on the date of service.

## 2023-01-31 NOTE — Unmapped (Signed)
Multidisciplinary Lung Nodule Board       Date: January 30, 2023     Time: 3-4 pm    Discussion:  12 month LDCT completed on 01/25/2023 and subsequent LDCTs in the [facility] Lung Screening Program    Recommendations: Consensus recommendation was in agreement with a 3 month LDCT with the option to consider a referral to IP and surgery if Dr. Sallyanne Kuster feels clinically warranted based on medical evaluation.      Communication: Notified Dr. Sallyanne Kuster at 15:38 on 01/30/2023. Per Dr. Sallyanne Kuster on 09:49 on 01/31/2023, she discussed results with Andrew Oconnell at his appointment yesterday which he is in agreement with a 3 month LDCT in 04/2023 and will discuss additional options based on findings of the short-term follow-up LDCT.    Appointment:  3 month LDCT scheduled 04/27/2023      Legrand Pitts RN  Nurse Navigator  [facility] Multidisciplinary Lung Screening Program  T: [phone]

## 2023-02-09 MED ORDER — ARIPIPRAZOLE 5 MG TABLET
ORAL_TABLET | Freq: Every day | ORAL | 0 refills | 90 days | Status: CP
Start: 2023-02-09 — End: ?
  Filled 2023-02-14: qty 90, 90d supply, fill #0

## 2023-02-09 NOTE — Unmapped (Signed)
[facility] Refill Coordination Note    Specialty Medication(s) to be Shipped:   Infectious Disease: Descovy and Tivicay    Other medication(s) to be shipped:  Abilify     Andrew Oconnell, DOB: 06/09/1956  Phone: [phone] (home)       All above HIPAA information was verified with patient.     Was a Nurse, learning disability used for this call? No    Completed refill call assessment today to schedule patient's medication shipment from the [facility] [phone]).  All relevant notes have been reviewed.     Specialty medication(s) and dose(s) confirmed: Regimen is correct and unchanged.   Changes to medications: [name] reports no changes at this time.  Changes to insurance: No  New side effects reported not previously addressed with a pharmacist or physician: None reported  Questions for the pharmacist: No    Confirmed patient received a Conservation officer, historic buildings and a Surveyor, mining with first shipment. The patient will receive a drug information handout for each medication shipped and additional FDA Medication Guides as required.       DISEASE/MEDICATION-SPECIFIC INFORMATION        N/A    SPECIALTY MEDICATION ADHERENCE     Medication Adherence    Patient reported X missed doses in the last month: 0  Specialty Medication: DESCOVY 200-25 mg tablet (emtricitabine-tenofovir alafen)  Patient is on additional specialty medications: Yes  Additional Specialty Medications: TIVICAY 50 mg TABLET (dolutegravir)  Patient Reported Additional Medication X Missed Doses in the Last Month: 0  Patient is on more than two specialty medications: No  Any gaps in refill history greater than 2 weeks in the last 3 months: no  Demonstrates understanding of importance of adherence: yes  Informant: patient  Confirmed plan for next specialty medication refill: delivery by pharmacy  Refills needed for supportive medications: not needed          Refill Coordination    Has the Patients' Contact Information Changed: No  Is the Shipping Address Different: No         Were doses missed due to medication being on hold? No    TIVICAY 50  mg: 10 days of medicine on hand   DESCOVY 200-25   mg: 10 days of medicine on hand        REFERRAL TO PHARMACIST     Referral to the pharmacist: Not needed      [facility]     Shipping address confirmed in Epic.       Delivery Scheduled: Yes, Expected medication delivery date: 02/15/2023.     Medication will be delivered via UPS to the prescription address in Epic WAM.    [name]   [facility] Specialty Technician

## 2023-02-14 MED FILL — DESCOVY 200 MG-25 MG TABLET: ORAL | 30 days supply | Qty: 30 | Fill #1

## 2023-02-14 MED FILL — TIVICAY 50 MG TABLET: ORAL | 30 days supply | Qty: 30 | Fill #1

## 2023-02-22 MED ORDER — IPRATROPIUM BROMIDE 42 MCG (0.06 %) NASAL SPRAY
Freq: Four times a day (QID) | NASAL | 2 refills | 21 days | Status: CN
Start: 2023-02-22 — End: 2024-02-22

## 2023-02-28 MED FILL — CALCIUM 600 + D(3) 600 MG-5 MCG (200 UNIT) CAPSULE: 90 days supply | Qty: 90 | Fill #1

## 2023-02-28 MED FILL — TRAZODONE 100 MG TABLET: ORAL | 90 days supply | Qty: 225 | Fill #1

## 2023-02-28 MED FILL — ANORO ELLIPTA 62.5 MCG-25 MCG/ACTUATION POWDER FOR INHALATION: RESPIRATORY_TRACT | 30 days supply | Qty: 60 | Fill #3

## 2023-03-02 ENCOUNTER — Ambulatory Visit
Admit: 2023-03-02 | Discharge: 2023-03-03 | Payer: MEDICARE | Attending: Student in an Organized Health Care Education/Training Program | Primary: Student in an Organized Health Care Education/Training Program

## 2023-03-02 DIAGNOSIS — F3131 Bipolar disorder, current episode depressed, mild: Principal | ICD-10-CM

## 2023-03-02 DIAGNOSIS — F4310 Post-traumatic stress disorder, unspecified: Secondary | ICD-10-CM | POA: Diagnosis not present

## 2023-03-02 DIAGNOSIS — F32A Depression, unspecified depression type: Principal | ICD-10-CM

## 2023-03-02 MED ORDER — SERTRALINE 25 MG TABLET
ORAL_TABLET | 0 refills | 0 days | Status: CP
Start: 2023-03-02 — End: ?
  Filled 2023-03-10: qty 60, 33d supply, fill #0
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
## 2023-03-03 MED ORDER — ARIPIPRAZOLE 5 MG TABLET
ORAL_TABLET | Freq: Every day | ORAL | 0 refills | 90 days
Start: 2023-03-03 — End: ?

## 2023-03-06 MED ORDER — ARIPIPRAZOLE 5 MG TABLET
ORAL_TABLET | Freq: Every day | ORAL | 0 refills | 90 days | Status: CP
Start: 2023-03-06 — End: ?
  Filled 2023-05-10: qty 90, 90d supply, fill #0

## 2023-03-07 ENCOUNTER — Ambulatory Visit: Admit: 2023-03-07 | Discharge: 2023-03-08 | Payer: MEDICARE

## 2023-03-07 DIAGNOSIS — I3481 Nonrheumatic mitral (valve) annulus calcification: Secondary | ICD-10-CM | POA: Diagnosis not present

## 2023-03-07 DIAGNOSIS — I517 Cardiomegaly: Secondary | ICD-10-CM | POA: Diagnosis not present

## 2023-03-07 DIAGNOSIS — I871 Compression of vein: Secondary | ICD-10-CM | POA: Diagnosis not present

## 2023-03-07 DIAGNOSIS — R9389 Abnormal findings on diagnostic imaging of other specified body structures: Secondary | ICD-10-CM | POA: Diagnosis not present

## 2023-03-07 MED ORDER — ATORVASTATIN 10 MG TABLET
ORAL_TABLET | Freq: Every day | ORAL | 2 refills | 90 days
Start: 2023-03-07 — End: ?

## 2023-03-07 MED ORDER — IPRATROPIUM BROMIDE 42 MCG (0.06 %) NASAL SPRAY
Freq: Four times a day (QID) | NASAL | 2 refills | 21 days
Start: 2023-03-07 — End: 2024-03-06

## 2023-03-07 NOTE — Unmapped (Signed)
[facility] Refill Coordination Note    Specialty Medication(s) to be Shipped:   Infectious Disease: Descovy and Tivicay    Other medication(s) to be shipped:  sertaline, losartan, lamotrigine, atorvastatin and aripiprazole      Andrew Oconnell, DOB: [birth year]  Phone: [phone] (home)       All above HIPAA information was verified with patient.     Was a Nurse, learning disability used for this call? No    Completed refill call assessment today to schedule patient's medication shipment from the [facility] [phone]).  All relevant notes have been reviewed.     Specialty medication(s) and dose(s) confirmed: Regimen is correct and unchanged.   Changes to medications: Andrew Oconnell reports no changes at this time.  Changes to insurance: No  New side effects reported not previously addressed with a pharmacist or physician: None reported  Questions for the pharmacist: No    Confirmed patient received a Conservation officer, historic buildings and a Surveyor, mining with first shipment. The patient will receive a drug information handout for each medication shipped and additional FDA Medication Guides as required.       DISEASE/MEDICATION-SPECIFIC INFORMATION        N/A    SPECIALTY MEDICATION ADHERENCE     Medication Adherence    Specialty Medication: DESCOVY 200-25 mg tablet (emtricitabine-tenofovir alafen)  Patient is on additional specialty medications: Yes  Additional Specialty Medications: TIVICAY 50 mg TABLET (dolutegravir)  Patient Reported Additional Medication X Missed Doses in the Last Month: 0  Patient is on more than two specialty medications: No  Any gaps in refill history greater than 2 weeks in the last 3 months: no  Demonstrates understanding of importance of adherence: yes  Informant: patient  Confirmed plan for next specialty medication refill: delivery by pharmacy  Refills needed for supportive medications: not needed          Refill Coordination    Has the Patients' Contact Information Changed: No  Is the Shipping Address Different: No         Were doses missed due to medication being on hold? No     DESCOVY 200-25  mg: 7 days of medicine on hand   TIVICAY 50 mg: 7 days of medicine on hand       REFERRAL TO PHARMACIST     Referral to the pharmacist: Not needed      [facility]     Shipping address confirmed in Epic.       Delivery Scheduled: Yes, Expected medication delivery date: 03/10/2023.     Medication will be delivered via UPS to the prescription address in Epic WAM.    Andrew Oconnell   [facility] Specialty Technician

## 2023-03-08 MED ORDER — ATORVASTATIN 10 MG TABLET
ORAL_TABLET | Freq: Every day | ORAL | 3 refills | 100 days
Start: 2023-03-08 — End: ?

## 2023-03-09 DIAGNOSIS — F419 Anxiety disorder, unspecified: Principal | ICD-10-CM

## 2023-03-09 MED FILL — LAMOTRIGINE 200 MG TABLET: ORAL | 90 days supply | Qty: 90 | Fill #1

## 2023-03-09 MED FILL — LOSARTAN 25 MG TABLET: ORAL | 90 days supply | Qty: 90 | Fill #1

## 2023-03-09 MED FILL — DESCOVY 200 MG-25 MG TABLET: ORAL | 30 days supply | Qty: 30 | Fill #2

## 2023-03-09 MED FILL — TIVICAY 50 MG TABLET: ORAL | 30 days supply | Qty: 30 | Fill #2

## 2023-03-09 MED FILL — ATORVASTATIN 10 MG TABLET: ORAL | 90 days supply | Qty: 90 | Fill #0

## 2023-03-27 MED FILL — ANORO ELLIPTA 62.5 MCG-25 MCG/ACTUATION POWDER FOR INHALATION: RESPIRATORY_TRACT | 30 days supply | Qty: 60 | Fill #4

## 2023-03-31 MED ORDER — SERTRALINE 25 MG TABLET
ORAL_TABLET | ORAL | 0 refills | 33 days
Start: 2023-03-31 — End: 2023-05-03

## 2023-04-03 MED ORDER — SERTRALINE 25 MG TABLET
ORAL_TABLET | ORAL | 0 refills | 33 days | Status: CP
Start: 2023-04-03 — End: 2023-05-06
  Filled 2023-04-04: qty 60, 33d supply, fill #0

## 2023-04-03 NOTE — Unmapped (Signed)
Medication Requested:  Zoloft       Future Appointments   Date Time Provider Department Center   04/06/2023  9:30 AM Russ Halo, MD [facility]   04/27/2023 11:00 AM [facility] CT RM 1 [facility] [place] [facility]   07/03/2023 10:30 AM Sallyanne Kuster, Mardene Speak, MD [facility]     Per Provider Note: START Zoloft 25mg  every day for 1 week and then increase to 50mg  every day     Standing order protocol requirements met?: No    Sent to: Provider for signing    Days Supply Given: TBD by provider  Number of Refills: TBD by provider

## 2023-04-04 MED FILL — DESCOVY 200 MG-25 MG TABLET: ORAL | 30 days supply | Qty: 30 | Fill #3

## 2023-04-04 MED FILL — TIVICAY 50 MG TABLET: ORAL | 30 days supply | Qty: 30 | Fill #3

## 2023-04-17 MED ORDER — TRAZODONE 100 MG TABLET
ORAL_TABLET | Freq: Every evening | ORAL | 1 refills | 90 days
Start: 2023-04-17 — End: ?

## 2023-04-17 NOTE — Unmapped (Signed)
Medication Requested: Trazodone       Future Appointments   Date Time Provider Department Center   04/27/2023 11:00 AM [facility] CT RM 1 [facility] [place] [facility]   05/04/2023 11:00 AM Gerkin, Kandis Cocking, MD [facility]   07/03/2023 10:30 AM Sallyanne Kuster, Mardene Speak, MD [facility]     Per Provider Note: ContinueTrazodone to 250mg  nightly for refractory insomnia       Standing order protocol requirements met?: No    Sent to: Provider  Days Supply Given: TBD by provider  Number of Refills: TBD by provider

## 2023-04-19 MED ORDER — TRAZODONE 100 MG TABLET
ORAL_TABLET | Freq: Every evening | ORAL | 1 refills | 90 days | Status: CP
Start: 2023-04-19 — End: ?
  Filled 2023-05-03: qty 225, 90d supply, fill #0

## 2023-04-24 MED ORDER — SERTRALINE 25 MG TABLET
ORAL_TABLET | ORAL | 0 refills | 33 days
Start: 2023-04-24 — End: 2023-05-27

## 2023-04-25 MED ORDER — SERTRALINE 25 MG TABLET
ORAL_TABLET | ORAL | 0 refills | 33 days
Start: 2023-04-25 — End: 2023-05-28

## 2023-04-25 MED FILL — ANORO ELLIPTA 62.5 MCG-25 MCG/ACTUATION POWDER FOR INHALATION: RESPIRATORY_TRACT | 30 days supply | Qty: 60 | Fill #5

## 2023-04-27 NOTE — Unmapped (Signed)
[facility] Refill Coordination Note    Specialty Medication(s) to be Shipped:   Infectious Disease: Descovy and Tivicay    Other medication(s) to be shipped:  trazodone     Andrew Oconnell, DOB: 1956-09-09  Phone: [phone] (home)       All above HIPAA information was verified with patient.     Was a Nurse, learning disability used for this call? No    Completed refill call assessment today to schedule patient's medication shipment from the [facility] [phone]).  All relevant notes have been reviewed.     Specialty medication(s) and dose(s) confirmed: Regimen is correct and unchanged.   Changes to medications: [name] reports no changes at this time.  Changes to insurance: No  New side effects reported not previously addressed with a pharmacist or physician: None reported  Questions for the pharmacist: No    Confirmed patient received a Conservation officer, historic buildings and a Surveyor, mining with first shipment. The patient will receive a drug information handout for each medication shipped and additional FDA Medication Guides as required.       DISEASE/MEDICATION-SPECIFIC INFORMATION        N/A    SPECIALTY MEDICATION ADHERENCE     Medication Adherence    Patient reported X missed doses in the last month: 0  Specialty Medication: DESCOVY 200-25 mg tablet (emtricitabine-tenofovir alafen)  Patient is on additional specialty medications: Yes  Additional Specialty Medications: TIVICAY 50 mg TABLET (dolutegravir)  Patient Reported Additional Medication X Missed Doses in the Last Month: 0  Patient is on more than two specialty medications: No  Any gaps in refill history greater than 2 weeks in the last 3 months: no  Demonstrates understanding of importance of adherence: yes  Informant: patient  Confirmed plan for next specialty medication refill: delivery by pharmacy  Refills needed for supportive medications: not needed          Refill Coordination    Has the Patients' Contact Information Changed: No  Is the Shipping Address Different: No         Were doses missed due to medication being on hold? No    DESCOVY 200-25  mg: 10 days of medicine on hand   TIVICAY 50  mg: 10 days of medicine on hand       REFERRAL TO PHARMACIST     Referral to the pharmacist: Not needed      [facility]     Shipping address confirmed in Epic.       Delivery Scheduled: Yes, Expected medication delivery date: 05/04/2023.     Medication will be delivered via UPS to the prescription address in Epic WAM.    [name]   [facility] Specialty Technician

## 2023-05-03 MED FILL — DESCOVY 200 MG-25 MG TABLET: ORAL | 30 days supply | Qty: 30 | Fill #4

## 2023-05-03 MED FILL — TIVICAY 50 MG TABLET: ORAL | 30 days supply | Qty: 30 | Fill #4

## 2023-05-06 ENCOUNTER — Ambulatory Visit: Admit: 2023-05-06 | Discharge: 2023-05-07 | Payer: MEDICARE

## 2023-05-06 DIAGNOSIS — R911 Solitary pulmonary nodule: Secondary | ICD-10-CM | POA: Diagnosis not present

## 2023-05-08 DIAGNOSIS — R918 Other nonspecific abnormal finding of lung field: Principal | ICD-10-CM

## 2023-05-09 MED ORDER — SERTRALINE 25 MG TABLET
ORAL_TABLET | ORAL | 0 refills | 33 days
Start: 2023-05-09 — End: 2023-06-11

## 2023-05-09 NOTE — Unmapped (Addendum)
Multidisciplinary Lung Nodule Board       Date: May 08, 2023      Time: 3-4 pm    Discussion:  Baseline LDCT 08/20/2013, 7 year LDCT 12/02/2019, 12 month LDCT 12/15/2020, 12 month LDCT 01/19/2022,  12 month LDCT 01/2023,  3 month LDCT 04/2023    Recommendations: PET CT and thoracic surgery consultation if appropriate per medical evaluation. Please note that these are only recommendations which the final recommendations will be determined by the ordering provider (Dr. Sallyanne Kuster) and her discussion with Andrew Oconnell.     Appointment:  Pending review by Dr. Sallyanne Kuster. Will schedule next follow-up per Dr. Sallyanne Kuster recommendations. Sent recommendations to Dr. Sallyanne Kuster and her nurse Joni Reining RN) on 05/09/2023.      Legrand Pitts RN  Nurse Navigator  [facility] Multidisciplinary Lung Screening Program  T: [phone]

## 2023-05-10 DIAGNOSIS — J449 Chronic obstructive pulmonary disease, unspecified: Principal | ICD-10-CM

## 2023-05-10 DIAGNOSIS — Z129 Encounter for screening for malignant neoplasm, site unspecified: Principal | ICD-10-CM

## 2023-05-10 DIAGNOSIS — R918 Other nonspecific abnormal finding of lung field: Principal | ICD-10-CM

## 2023-05-10 DIAGNOSIS — Z720 Tobacco use: Principal | ICD-10-CM

## 2023-05-10 NOTE — Unmapped (Addendum)
Received message from patient of Dr Sallyanne Kuster' that he would like his PET scan scheduled that was ordered by covering provider, Dr Lurena Nida.     Reached out to scheduling, was advised by scheduling to change order to PET/CT Skull base to Thigh. Pended order to this note, as there are questions re: radiopharmaceuticals.     Note/order sent to Dr Lurena Nida.     05/10/23 2:17 PM  New order placed by Dr Lurena Nida. Scheduled patient for [facility].     Message sent to patient with appointment information re: PET/CT scan.     Shelba Flake Gentry Fitz, RN  CF Nurse Coordinator   [phone]

## 2023-05-10 NOTE — Unmapped (Signed)
Mr. Bitting -   I can see that you have already seen this CT scan result; this is the unfortunate effect of patients being sent their results before their providers have seen them.  Dr. Sallyanne Kuster is on vacation and I am helping cover for her patients while she is away.   Would you prefer for me to go ahead and send the referral to thoracic surgery and the additional scan that the radiologist recommended, or would you rather wait to discuss with Dr. Sallyanne Kuster when she returns next week?   Dr. Curley Spice

## 2023-05-23 MED ORDER — SERTRALINE 25 MG TABLET
ORAL_TABLET | ORAL | 0 refills | 33 days | Status: CP
Start: 2023-05-23 — End: 2023-06-25
  Filled 2023-05-31: qty 60, 33d supply, fill #0

## 2023-05-23 NOTE — Unmapped (Signed)
[facility] Refill Coordination Note    Specialty Medication(s) to be Shipped:   Infectious Disease: Descovy and Tivicay    Other medication(s) to be shipped:  Anoro, sertraline, calcium  600 + D3     Andrew Oconnell, DOB: 31-Jan-1956  Phone: [phone] (home)       All above HIPAA information was verified with patient.     Was a Nurse, learning disability used for this call? No    Completed refill call assessment today to schedule patient's medication shipment from the [facility] [phone]).  All relevant notes have been reviewed.     Specialty medication(s) and dose(s) confirmed: Regimen is correct and unchanged.   Changes to medications: Lamaj reports no changes at this time.  Changes to insurance: No  New side effects reported not previously addressed with a pharmacist or physician: None reported  Questions for the pharmacist: No    Confirmed patient received a Conservation officer, historic buildings and a Surveyor, mining with first shipment. The patient will receive a drug information handout for each medication shipped and additional FDA Medication Guides as required.       DISEASE/MEDICATION-SPECIFIC INFORMATION        N/A    SPECIALTY MEDICATION ADHERENCE     Medication Adherence    Specialty Medication: DESCOVY 200-25 mg tablet (emtricitabine-tenofovir alafen)  Patient is on additional specialty medications: Yes  Additional Specialty Medications: TIVICAY 50 mg TABLET (dolutegravir)  Patient Reported Additional Medication X Missed Doses in the Last Month: 0  Patient is on more than two specialty medications: No  Any gaps in refill history greater than 2 weeks in the last 3 months: no  Demonstrates understanding of importance of adherence: yes  Informant: patient  Confirmed plan for next specialty medication refill: delivery by pharmacy  Refills needed for supportive medications: not needed          Refill Coordination    Has the Patients' Contact Information Changed: No  Is the Shipping Address Different: No         Were doses missed due to medication being on hold? No    TIVICAY 50  mg: 10 days of medicine on hand   DESCOVY 200-25  mg: 10 days of medicine on hand       REFERRAL TO PHARMACIST     Referral to the pharmacist: Not needed      [facility]     Shipping address confirmed in Epic.       Delivery Scheduled: Yes, Expected medication delivery date: 06/01/2023.     Medication will be delivered via UPS to the prescription address in Epic WAM.    [name]   [facility] Specialty Technician

## 2023-05-31 MED FILL — ANORO ELLIPTA 62.5 MCG-25 MCG/ACTUATION POWDER FOR INHALATION: RESPIRATORY_TRACT | 30 days supply | Qty: 60 | Fill #6

## 2023-05-31 MED FILL — TIVICAY 50 MG TABLET: ORAL | 30 days supply | Qty: 30 | Fill #5

## 2023-05-31 MED FILL — CALCIUM 600 + D(3) 600 MG-5 MCG (200 UNIT) CAPSULE: 90 days supply | Qty: 90 | Fill #2

## 2023-05-31 MED FILL — DESCOVY 200 MG-25 MG TABLET: ORAL | 30 days supply | Qty: 30 | Fill #5

## 2023-06-01 ENCOUNTER — Ambulatory Visit: Admit: 2023-06-01 | Discharge: 2023-06-02 | Payer: MEDICARE | Attending: Psychiatry | Primary: Psychiatry

## 2023-06-01 DIAGNOSIS — F32A Depression, unspecified depression type: Principal | ICD-10-CM

## 2023-06-01 DIAGNOSIS — F3131 Bipolar disorder, current episode depressed, mild: Principal | ICD-10-CM

## 2023-06-01 DIAGNOSIS — F4310 Post-traumatic stress disorder, unspecified: Secondary | ICD-10-CM | POA: Diagnosis not present

## 2023-06-01 DIAGNOSIS — N521 Erectile dysfunction due to diseases classified elsewhere: Secondary | ICD-10-CM | POA: Diagnosis not present

## 2023-06-01 MED ORDER — SILDENAFIL 50 MG TABLET
ORAL_TABLET | 0 refills | 0 days | Status: CP
Start: 2023-06-01 — End: ?

## 2023-06-01 MED ORDER — SERTRALINE 100 MG TABLET
ORAL_TABLET | Freq: Every day | ORAL | 5 refills | 30 days | Status: CP
Start: 2023-06-01 — End: ?
  Filled 2023-06-07: qty 30, 30d supply, fill #0
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
## 2023-06-05 DIAGNOSIS — N521 Erectile dysfunction due to diseases classified elsewhere: Principal | ICD-10-CM

## 2023-06-05 MED ORDER — SILDENAFIL 50 MG TABLET
ORAL_TABLET | 0 refills | 0 days | Status: CP
Start: 2023-06-05 — End: ?
  Filled 2023-06-07: qty 4, 30d supply, fill #0

## 2023-06-07 MED FILL — LAMOTRIGINE 200 MG TABLET: ORAL | 90 days supply | Qty: 90 | Fill #2

## 2023-06-07 MED FILL — ATORVASTATIN 10 MG TABLET: ORAL | 90 days supply | Qty: 90 | Fill #1

## 2023-06-07 MED FILL — LOSARTAN 25 MG TABLET: ORAL | 90 days supply | Qty: 90 | Fill #2

## 2023-06-16 ENCOUNTER — Ambulatory Visit: Admit: 2023-06-16 | Payer: MEDICARE

## 2023-06-23 ENCOUNTER — Ambulatory Visit: Admit: 2023-06-23 | Discharge: 2023-06-24 | Payer: MEDICARE

## 2023-06-23 DIAGNOSIS — R918 Other nonspecific abnormal finding of lung field: Secondary | ICD-10-CM | POA: Diagnosis not present

## 2023-06-23 MED ADMIN — Fluorine F-18 FDG 4-40 mCi IV: 5 | INTRAVENOUS | @ 16:00:00 | Stop: 2023-06-23

## 2023-06-26 NOTE — Unmapped (Signed)
[facility] Refill Coordination Note    Specialty Medication(s) to be Shipped:   Infectious Disease: Descovy and Tivicay    Other medication(s) to be shipped:  Andrew Oconnell, DOB: Nov 29, 1955  Phone: [phone] (home)       All above HIPAA information was verified with patient.     Was a Nurse, learning disability used for this call? No    Completed refill call assessment today to schedule patient's medication shipment from the [facility] [phone]).  All relevant notes have been reviewed.     Specialty medication(s) and dose(s) confirmed: Regimen is correct and unchanged.   Changes to medications: Antaeus reports no changes at this time.  Changes to insurance: No  New side effects reported not previously addressed with a pharmacist or physician: None reported  Questions for the pharmacist: No    Confirmed patient received a Conservation officer, historic buildings and a Surveyor, mining with first shipment. The patient will receive a drug information handout for each medication shipped and additional FDA Medication Guides as required.       DISEASE/MEDICATION-SPECIFIC INFORMATION        N/A    SPECIALTY MEDICATION ADHERENCE     Medication Adherence    Patient reported X missed doses in the last month: 0  Specialty Medication: DESCOVY 200-25 mg tablet (emtricitabine-tenofovir alafen)  Patient is on additional specialty medications: Yes  Additional Specialty Medications: TIVICAY 50 mg TABLET (dolutegravir)  Patient Reported Additional Medication X Missed Doses in the Last Month: 0  Patient is on more than two specialty medications: No  Any gaps in refill history greater than 2 weeks in the last 3 months: no  Demonstrates understanding of importance of adherence: yes  Informant: patient  Confirmed plan for next specialty medication refill: delivery by pharmacy  Refills needed for supportive medications: not needed          Refill Coordination    Has the Patients' Contact Information Changed: No  Is the Shipping Address Different: No         Were doses missed due to medication being on hold? No    TIVICAY 50  mg: 7 days of medicine on hand   DESCOVY 200-25  mg: 7 days of medicine on hand       REFERRAL TO PHARMACIST     Referral to the pharmacist: Not needed      [facility]     Shipping address confirmed in Epic.       Delivery Scheduled: Yes, Expected medication delivery date: 07/04/2023.     Medication will be delivered via UPS to the prescription address in Epic WAM.    [name]   [facility] Specialty Technician

## 2023-06-28 DIAGNOSIS — R911 Solitary pulmonary nodule: Principal | ICD-10-CM

## 2023-06-28 NOTE — Unmapped (Signed)
Reviewed results of PET-CT with patient. 2.7 x 1.2 cm sub-pleural nodule from CT chest in July is still visualized but is minimally FDG-avid (SUV 1.15), so less likely that it represents malignancy. Will continue to keep a very close eye on it, plan for repeat CT chest in 3 months. Order placed today and patient agrees with plan.

## 2023-07-04 MED FILL — ANORO ELLIPTA 62.5 MCG-25 MCG/ACTUATION POWDER FOR INHALATION: RESPIRATORY_TRACT | 30 days supply | Qty: 60 | Fill #7

## 2023-07-04 MED FILL — DESCOVY 200 MG-25 MG TABLET: ORAL | 30 days supply | Qty: 30 | Fill #6

## 2023-07-04 MED FILL — TIVICAY 50 MG TABLET: ORAL | 30 days supply | Qty: 30 | Fill #6

## 2023-07-21 MED FILL — SERTRALINE 100 MG TABLET: ORAL | 30 days supply | Qty: 30 | Fill #1

## 2023-07-21 MED FILL — TRAZODONE 100 MG TABLET: ORAL | 90 days supply | Qty: 225 | Fill #1

## 2023-07-21 MED FILL — SILDENAFIL 50 MG TABLET: 30 days supply | Qty: 4 | Fill #1

## 2023-07-25 DIAGNOSIS — B20 Human immunodeficiency virus [HIV] disease: Principal | ICD-10-CM

## 2023-07-25 MED ORDER — TIVICAY 50 MG TABLET
ORAL_TABLET | Freq: Every day | ORAL | 6 refills | 30 days
Start: 2023-07-25 — End: ?

## 2023-07-25 MED ORDER — DESCOVY 200 MG-25 MG TABLET
ORAL_TABLET | Freq: Every day | ORAL | 6 refills | 30 days
Start: 2023-07-25 — End: ?

## 2023-07-25 MED ORDER — ARIPIPRAZOLE 5 MG TABLET
ORAL_TABLET | Freq: Every day | ORAL | 0 refills | 90 days
Start: 2023-07-25 — End: ?

## 2023-07-25 NOTE — Unmapped (Signed)
No longer under prescriber care

## 2023-07-26 DIAGNOSIS — F3170 Bipolar disorder, currently in remission, most recent episode unspecified: Principal | ICD-10-CM

## 2023-07-26 MED ORDER — ARIPIPRAZOLE 5 MG TABLET
ORAL_TABLET | Freq: Every day | ORAL | 3 refills | 90 days | Status: CP
Start: 2023-07-26 — End: ?
  Filled 2023-07-31: qty 90, 90d supply, fill #0

## 2023-07-26 MED ORDER — DESCOVY 200 MG-25 MG TABLET
ORAL_TABLET | Freq: Every day | ORAL | 6 refills | 30.00000 days | Status: CN
Start: 2023-07-26 — End: ?

## 2023-07-26 MED ORDER — TIVICAY 50 MG TABLET
ORAL_TABLET | Freq: Every day | ORAL | 6 refills | 30.00000 days | Status: CN
Start: 2023-07-26 — End: ?

## 2023-07-26 NOTE — Unmapped (Signed)
Medication should be filled by pt's PCP, Jodi Marble at [facility]

## 2023-07-27 ENCOUNTER — Telehealth: Admit: 2023-07-27 | Discharge: 2023-07-28 | Payer: MEDICARE | Attending: Psychiatry | Primary: Psychiatry

## 2023-07-27 DIAGNOSIS — F99 Mental disorder, not otherwise specified: Principal | ICD-10-CM

## 2023-07-27 DIAGNOSIS — F5105 Insomnia due to other mental disorder: Principal | ICD-10-CM

## 2023-07-27 DIAGNOSIS — F3176 Bipolar disorder, in full remission, most recent episode depressed: Secondary | ICD-10-CM | POA: Diagnosis not present

## 2023-07-27 NOTE — Unmapped (Signed)
[facility] Delivery Pharm[facility] Clinical Assessment & Refill Coordination Note    Andrew Oconnell, DOB: Jan 31, 1956  Phone: [phone] (home)     All above HIPAA information was verified with patient.     Was a Nurse, learning disability used for this call? No    Specialty Medication(s):   Infectious Disease: Descovy and Tivicay     Current Outpatient Medications   Medication Sig Dispense Refill    albuterol HFA 90 mcg/actuation inhaler Inhale 1 puff every six (6) hours as needed for wheezing or shortness of breath. 8.5 g 0    aripiprazole (ABILIFY) 5 MG tablet Take 1 tablet (5 mg total) by mouth daily. 90 tablet 3    atorvastatin (LIPITOR) 10 MG tablet Take 1 tablet (10 mg total) by mouth daily for high cholesterol. 100 tablet 3    calcium carbonate-vitamin D3 (CALCIUM 600 + D,3,) 600 mg-5 mcg (200 unit) cap Take 1 capsule by mouth once a day either take with FOOD and your Tivicay+Descovy or 6 hours after Tivicay+descovy 100 capsule 11    dolutegravir (TIVICAY) 50 mg TABLET Take 1 tablet (50 mg total) by mouth daily. 30 tablet 6    emtricitabine-tenofovir alafen (DESCOVY) 200-25 mg tablet Take 1 tablet by mouth daily. 30 tablet 6    lamoTRIgine (LAMICTAL) 200 MG tablet Take 1 tablet (200 mg total) by mouth daily. 90 tablet 3    losartan (COZAAR) 25 MG tablet Take 1 tablet by mouth once a day 90 tablet 1    losartan (COZAAR) 25 MG tablet Take 1 tablet by mouth once a day 90 tablet 3    naproxen sodium (ALEVE) 220 MG tablet Take 1 tablet (220 mg total) by mouth daily as needed for pain.      sertraline (ZOLOFT) 100 MG tablet Take 1 tablet (100 mg total) by mouth daily. 30 tablet 5    sildenafil (VIAGRA) 50 MG tablet Take 1 to 2 tablets 45 minutes prior to sexual activity 30 tablet 0    traZODone (DESYREL) 100 MG tablet Take 2 and 1/2  tablets (250 mg total) by mouth nightly. 225 tablet 1    umeclidinium-vilanterol (ANORO ELLIPTA) 62.5-25 mcg/actuation inhaler Inhale 1 puff daily. 180 each 3     No current facility-administered medications for this visit.        Changes to medications: Deward reports no changes at this time.    Allergies   Allergen Reactions    Pollen Extracts      Congestion      Sulfa (Sulfonamide Antibiotics) Rash       Changes to allergies: No    SPECIALTY MEDICATION ADHERENCE     Descovy 200-25 mg: approximately 10 days of medicine on hand   Tivicay 50 mg: approximately 10 days of medicine on hand       Medication Adherence    Patient reported X missed doses in the last month: 0  Specialty Medication: Descovy 200-25mg   Patient is on additional specialty medications: Yes  Additional Specialty Medications: Tivicay 50mg   Patient Reported Additional Medication X Missed Doses in the Last Month: 0  Patient is on more than two specialty medications: No  Any gaps in refill history greater than 2 weeks in the last 3 months: no  Demonstrates understanding of importance of adherence: yes  Informant: patient  Provider-estimated medication adherence level: good  Patient is at risk for Non-Adherence: No  Confirmed plan for next specialty medication refill: delivery by pharmacy  Refills needed for supportive  medications: yes, ordered or provider notified          Specialty medication(s) dose(s) confirmed: Regimen is correct and unchanged.     Are there any concerns with adherence? No    Adherence counseling provided? Not needed    CLINICAL MANAGEMENT AND INTERVENTION      Clinical Benefit Assessment:    Do you feel the medicine is effective or helping your condition? Yes    HIV ASSOCIATED LABS:     Lab Results   Component Value Date/Time    HIVRS Detected (A) 01/12/2023 09:53 AM    HIVRS Detected (A) 03/15/2022 11:31 AM    HIVRS Detected (A) 10/14/2021 10:09 AM    HIVRS <40 08/24/2017 12:00 AM    HIVRS Not Detected 07/29/2014 12:50 PM    HIVRS Not Detected 02/18/2014 11:42 AM    HIVCP <20 (H) 01/12/2023 09:53 AM    HIVCP <20 (H) 03/15/2022 11:31 AM    HIVCP <40 (H) 10/14/2021 10:09 AM    HIVCP <40 copies/mL 03/25/2020 12:00 AM HIVCP <40 11/29/2016 12:00 AM    HIVCP <40 12/01/2015 12:00 AM    RCD4 36.1 03/25/2020 12:00 AM    RCD4 31.5 06/28/2016 12:00 AM    RCD4 29.6 12/01/2015 12:00 AM    ACD4 252 (L) 01/12/2023 09:53 AM    ACD4 444 (L) 03/15/2022 11:31 AM    ACD4 429 (L) 10/14/2021 10:09 AM    ACD4 433 (A) 03/25/2020 12:00 AM    ACD4 630 06/28/2016 12:00 AM    ACD4 710 12/01/2015 12:00 AM       Clinical Benefit counseling provided? Labs from 01/12/23 show evidence of clinical benefit    Adverse Effects Assessment:    Are you experiencing any side effects? No    Are you experiencing difficulty administering your medicine? No    Quality of Life Assessment:    How many days over the past month did your HIV  keep you from your normal activities? For example, brushing your teeth or getting up in the morning. 0    Have you discussed this with your provider? Not needed    Acute Infection Status:    Acute infections noted within Epic:  No active infections  Patient reported infection: None    Therapy Appropriateness:    Is therapy appropriate based on current medication list, adverse reactions, adherence, clinical benefit and progress toward achieving therapeutic goals? Yes, therapy is appropriate and should be continued     DISEASE/MEDICATION-SPECIFIC INFORMATION      N/A    HIV: Not Applicable    PATIENT SPECIFIC NEEDS     Does the patient have any physical, cognitive, or cultural barriers? No    Is the patient high risk? No    Did the patient require a clinical intervention? No    Does the patient require physician intervention or other additional services (i.e., nutrition, smoking cessation, social work)? No    SOCIAL DETERMINANTS OF HEALTH     At the [facility], we have learned that life circumstances - like trouble affording food, housing, utilities, or transportation can affect the health of many of our patients.   That is why we wanted to ask: are you currently experiencing any life circumstances that are negatively impacting your health and/or quality of life? Patient declined to answer    Social Determinants of Health     Food Insecurity: No Food Insecurity (08/26/2022)    Received from [facility], [facility]  Hunger Vital Sign     Worried About Running Out of Food in the Last Year: Never true     Ran Out of Food in the Last Year: Never true   Internet Connectivity: Not on file   Housing/Utilities: Unknown (01/21/2021)    Housing/Utilities     Within the past 12 months, have you ever stayed: outside, in a car, in a tent, in an overnight shelter, or temporarily in someone else's home (i.e. couch-surfing)?: No     Are you worried about losing your housing?: Not on file     Within the past 12 months, have you been unable to get utilities (heat, electricity) when it was really needed?: Not on file   Tobacco Use: High Risk (06/01/2023)    Patient History     Smoking Tobacco Use: Every Day     Smokeless Tobacco Use: Never     Passive Exposure: Not on file   Transportation Needs: No Transportation Needs (08/26/2022)    Received from [facility], [facility]    [facility] - Transportation     Lack of Transportation (Medical): No     Lack of Transportation (Non-Medical): No   Alcohol Use: Not on file   Interpersonal Safety: Unknown (07/26/2023)    Interpersonal Safety     Unsafe Where You Currently Live: Not on file     Physically Hurt by Anyone: Not on file     Abused by Anyone: Not on file   Physical Activity: Not on file   Intimate Partner Violence: Not At Risk (08/26/2022)    Received from [facility], [facility]    Humiliation, Afraid, Rape, and Kick questionnaire     Fear of Current or Ex-Partner: No     Emotionally Abused: No     Physically Abused: No     Sexually Abused: No   Stress: Not on file   Substance Use: Not on file   Social Connections: Not on file   Financial Resource Strain: Not on file   Depression: Not on file   Health Literacy: Low Risk  (01/21/2021)    Health Literacy     : Never       Would you be willing to receive help with any of the needs that you have identified today? Not applicable       SHIPPING     Specialty Medication(s) to be Shipped:   Infectious Disease: Descovy and Tivicay    Other medication(s) to be shipped:  Anoro Ellipta 62.5-25mcg and aripiprazole 5mg      Changes to insurance: No    Delivery Scheduled: Yes, Expected medication delivery date: 08/01/23.  However, Rx request for refills was sent to the provider as there are none remaining.     Medication will be delivered via UPS to the confirmed prescription address in [facility].    The patient will receive a drug information handout for each medication shipped and additional FDA Medication Guides as required.  Verified that patient has previously received a Conservation officer, historic buildings and a Surveyor, mining.    The patient or caregiver noted above participated in the development of this care plan and knows that they can request review of or adjustments to the care plan at any time.      All of the patient's questions and concerns have been addressed.    [name], PharmD   [facility] Specialty Pharmacist

## 2023-07-27 NOTE — Unmapped (Signed)
[facility] Checklist     Type of visit:  video    Are you located in [place]? yes    Reason for visit: follow up    Questions / Concerns that need to be addressed: no concerns    General Consent to Treat (GCT) for epic video visits only: Verbal consent    HCDM reviewed and updated in Epic:    We are working to make sure all of our patients??? wishes are updated in Epic and part of that is documenting a Environmental health practitioner for each patient  A Health Care Decision Maker is someone you choose who can make health care decisions for you if you are not able - who would you most want to do this for you????  is already up to date.    HCDM (patient stated preference): Weyman,Laurie - Sister - [phone]    COVID-19 Vaccine Summary  Which COVID-19 Vaccine was administered  Moderna  Type:  Dates Given:  09/26/2022                   If no: Are you interested in scheduling? Declines vaccine  Over the last 2 weeks, how often have you been bothered by any of the following problems?  Please put a number beside each question.  Scale:  0= Not at all  1= Several days  2= More than half the days  3= Nearly every day      Little interest or pleasure in doing things:0  Feeling down, depressed, or hopeless:0  Trouble falling or staying asleep, or sleeping too much:1  Feeling tired or having little energy:1  Poor appetite or overeating:0  Feeling bad about yourself- or that you are a failure or have let yourself or your family down:0  Trouble concentrating on things, such as reading the newspaper or watching television:0  Moving or speaking so slowly that other people could have noticed. Or the opposite- being so fidgety or restless that you have been moving around a lot more than usual: 0 Over the last 2 weeks, how often have you been bothered by any of the following problems?  Please put a number beside each question.  Scale:  0= Not at all  1= Several days  2= More than half the days  3= Nearly every day      Little interest or pleasure in doing things:  Feeling down, depressed, or hopeless:  Trouble falling or staying asleep, or sleeping too much:  Feeling tired or having little energy:  Poor appetite or overeating:  Feeling bad about yourself- or that you are a failure or have let yourself or your family down:  Trouble concentrating on things, such as reading the newspaper or watching television:  Moving or speaking so slowly that other people could have noticed. Or the opposite- being so fidgety or restless that you have been moving around a lot more than usual:
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
## 2023-07-31 MED ORDER — DESCOVY 200 MG-25 MG TABLET
ORAL_TABLET | 6 refills | 0 days
Start: 2023-07-31 — End: ?

## 2023-07-31 MED ORDER — TIVICAY 50 MG TABLET
ORAL_TABLET | 6 refills | 0 days
Start: 2023-07-31 — End: ?

## 2023-07-31 MED FILL — ANORO ELLIPTA 62.5 MCG-25 MCG/ACTUATION POWDER FOR INHALATION: RESPIRATORY_TRACT | 30 days supply | Qty: 60 | Fill #8

## 2023-07-31 MED FILL — TIVICAY 50 MG TABLET: 30 days supply | Qty: 30 | Fill #0

## 2023-07-31 MED FILL — DESCOVY 200 MG-25 MG TABLET: 30 days supply | Qty: 30 | Fill #0

## 2023-08-31 NOTE — Unmapped (Signed)
[facility] Specialty and Home Delivery Pharmacy Refill Coordination Note    Specialty Medication(s) to be Shipped:   Infectious Disease: Descovy and Tivicay    Other medication(s) to be shipped:  sertraline , sildenafil , calcium, anoro, lamotrigine and losartan      Andrew Oconnell, DOB: Nov 22, 1955  Phone: [phone] (home)       All above HIPAA information was verified with patient.     Was a Nurse, learning disability used for this call? No    Completed refill call assessment today to schedule patient's medication shipment from the [facility] and Home Delivery Pharmacy  [phone]).  All relevant notes have been reviewed.     Specialty medication(s) and dose(s) confirmed: Regimen is correct and unchanged.   Changes to medications: [name] reports no changes at this time.  Changes to insurance: No  New side effects reported not previously addressed with a pharmacist or physician: None reported  Questions for the pharmacist: No    Confirmed patient received a Conservation officer, historic buildings and a Surveyor, mining with first shipment. The patient will receive a drug information handout for each medication shipped and additional FDA Medication Guides as required.       DISEASE/MEDICATION-SPECIFIC INFORMATION        N/A    SPECIALTY MEDICATION ADHERENCE     Medication Adherence    Patient reported X missed doses in the last month: 0  Specialty Medication: DESCOVY 200-25 mg tablet (emtricitabine-tenofovir alafen)  Patient is on additional specialty medications: Yes  Additional Specialty Medications: TIVICAY 50 mg TABLET (dolutegravir)  Patient Reported Additional Medication X Missed Doses in the Last Month: 0  Patient is on more than two specialty medications: No              Were doses missed due to medication being on hold? No    Tivicay 50 mg: 10 days of medicine on hand   Descovy 200-25 mg: 10 days of medicine on hand       REFERRAL TO PHARMACIST     Referral to the pharmacist: Not needed      [facility]     Shipping address confirmed in Epic. Delivery Scheduled: Yes, Expected medication delivery date: 09/08/23.     Medication will be delivered via UPS to the prescription address in Epic WAM.    [name]   [facility] Specialty and Home Delivery Pharmacy  Specialty Technician

## 2023-09-07 MED FILL — SERTRALINE 100 MG TABLET: ORAL | 30 days supply | Qty: 30 | Fill #2

## 2023-09-07 MED FILL — DESCOVY 200 MG-25 MG TABLET: 30 days supply | Qty: 30 | Fill #1

## 2023-09-07 MED FILL — ANORO ELLIPTA 62.5 MCG-25 MCG/ACTUATION POWDER FOR INHALATION: RESPIRATORY_TRACT | 30 days supply | Qty: 60 | Fill #9

## 2023-09-07 MED FILL — CALCIUM 600 + D(3) 600 MG-5 MCG (200 UNIT) CAPSULE: 90 days supply | Qty: 90 | Fill #3

## 2023-09-07 MED FILL — TIVICAY 50 MG TABLET: 30 days supply | Qty: 30 | Fill #1

## 2023-09-07 MED FILL — LOSARTAN 25 MG TABLET: ORAL | 90 days supply | Qty: 90 | Fill #3

## 2023-09-07 MED FILL — SILDENAFIL 50 MG TABLET: 30 days supply | Qty: 4 | Fill #2

## 2023-09-07 MED FILL — LAMOTRIGINE 200 MG TABLET: ORAL | 90 days supply | Qty: 90 | Fill #3

## 2023-09-28 NOTE — Unmapped (Signed)
[facility] Specialty and Home Delivery Pharmacy Refill Coordination Note    Specialty Medication(s) to be Shipped:   Infectious Disease: Descovy and Tivicay    Other medication(s) to be shipped:  sildenafil, Anoro, sertraline,      Andrew Oconnell, DOB: Mar 17, 1956  Phone: [phone] (home)       All above HIPAA information was verified with patient.     Was a Nurse, learning disability used for this call? No    Completed refill call assessment today to schedule patient's medication shipment from the [facility] and Home Delivery Pharmacy  [phone]).  All relevant notes have been reviewed.     Specialty medication(s) and dose(s) confirmed: Regimen is correct and unchanged.   Changes to medications: Gaelen reports no changes at this time.  Changes to insurance: No  New side effects reported not previously addressed with a pharmacist or physician: None reported  Questions for the pharmacist: No    Confirmed patient received a Conservation officer, historic buildings and a Surveyor, mining with first shipment. The patient will receive a drug information handout for each medication shipped and additional FDA Medication Guides as required.       DISEASE/MEDICATION-SPECIFIC INFORMATION        N/A    SPECIALTY MEDICATION ADHERENCE     Medication Adherence    Patient reported X missed doses in the last month: 0  Specialty Medication: DESCOVY 200-25 mg tablet (emtricitabine-tenofovir alafen)  Patient is on additional specialty medications: Yes  Additional Specialty Medications: TIVICAY 50 mg TABLET (dolutegravir)  Patient Reported Additional Medication X Missed Doses in the Last Month: 0  Patient is on more than two specialty medications: No  Any gaps in refill history greater than 2 weeks in the last 3 months: no  Demonstrates understanding of importance of adherence: yes  Informant: patient  Confirmed plan for next specialty medication refill: delivery by pharmacy  Refills needed for supportive medications: not needed          Refill Coordination    Has the Patients' Contact Information Changed: No  Is the Shipping Address Different: No         Were doses missed due to medication being on hold? No    TIVICAY 50  mg: 10 days of medicine on hand   DESCOVY 200-25  mg: 10 days of medicine on hand       REFERRAL TO PHARMACIST     Referral to the pharmacist: Not needed      [facility]     Shipping address confirmed in Epic.       Delivery Scheduled: Yes, Expected medication delivery date: 10/04/23.     Medication will be delivered via UPS to the prescription address in Epic WAM.    [name]   [facility] Specialty and Home Delivery Pharmacy  Specialty Technician

## 2023-10-03 MED FILL — TIVICAY 50 MG TABLET: 30 days supply | Qty: 30 | Fill #2

## 2023-10-03 MED FILL — ANORO ELLIPTA 62.5 MCG-25 MCG/ACTUATION POWDER FOR INHALATION: RESPIRATORY_TRACT | 30 days supply | Qty: 60 | Fill #10

## 2023-10-03 MED FILL — SERTRALINE 100 MG TABLET: ORAL | 30 days supply | Qty: 30 | Fill #3

## 2023-10-03 MED FILL — SILDENAFIL 50 MG TABLET: 30 days supply | Qty: 4 | Fill #3

## 2023-10-03 MED FILL — DESCOVY 200 MG-25 MG TABLET: 30 days supply | Qty: 30 | Fill #2

## 2023-10-21 DIAGNOSIS — B20 Human immunodeficiency virus [HIV] disease: Principal | ICD-10-CM

## 2023-10-21 MED ORDER — TRAZODONE 100 MG TABLET
ORAL_TABLET | Freq: Every evening | ORAL | 1 refills | 90.00 days
Start: 2023-10-21 — End: ?

## 2023-10-21 MED ORDER — TIVICAY 50 MG TABLET
ORAL_TABLET | Freq: Every day | ORAL | 6 refills | 30.00 days
Start: 2023-10-21 — End: ?

## 2023-10-23 NOTE — Unmapped (Signed)
Medication Requested: Tivicay       No future appointments. Has cancel multiple times.   Per Provider Note: Overall doing well. Current regimen: Descovy (FTC/TAF) and dolutegravir  Misses doses of ARVs never Denies any missed doses since last visit.    Standing order protocol requirements met?: No    Sent to: Provider for signing    Days Supply Given: TBD by provider   Number of Refills: TBD by provider

## 2023-10-23 NOTE — Unmapped (Signed)
Medication Requested: Trazodone       No future appointments. No upcoming appointment listed   Per Provider Note: ContinueTrazodone to 250mg  nightly for refractory insomnia, he notes he may be sleeping a bit better, but that it does not work well every night. He denies side effects.     Standing order protocol requirements met?: No    Sent to: Provider for signing    Days Supply Given: TBD by provider   Number of Refills: TBD by provider

## 2023-10-24 MED ORDER — TRAZODONE 100 MG TABLET
ORAL_TABLET | Freq: Every evening | ORAL | 1 refills | 90.00 days | Status: CP
Start: 2023-10-24 — End: ?
  Filled 2023-11-03: qty 225, 90d supply, fill #0

## 2023-10-30 DIAGNOSIS — B20 Human immunodeficiency virus [HIV] disease: Principal | ICD-10-CM

## 2023-11-01 NOTE — Unmapped (Signed)
The [facility] has made a second and final attempt to reach this patient to refill the following medication:DESCOVY and TIVICAY.      We have left voicemails on the following phone numbers: [phone] and have sent a text message to the following phone numbers: [phone] .    Dates contacted: 10/25/23 and 11/01/23  Last scheduled delivery: 10/02/24    The patient may be at risk of non-compliance with this medication. The patient should call the [facility] at [phone]  Option 4, then Option 4: Infectious Disease, Transplant to refill medication.    [name]   [facility] Specialty and [facility]

## 2023-11-01 NOTE — Unmapped (Signed)
[facility] Specialty and Home Delivery Pharmacy Refill Coordination Note    Specialty Medication(s) to be Shipped:   Infectious Disease: Descovy and Tivicay    Other medication(s) to be shipped:  trazodone     Andrew Oconnell, DOB: 1956/02/28  Phone: [phone] (home)       All above HIPAA information was verified with patient.     Was a Nurse, learning disability used for this call? No    Completed refill call assessment today to schedule patient's medication shipment from the [facility] and Home Delivery Pharmacy  [phone]).  All relevant notes have been reviewed.     Specialty medication(s) and dose(s) confirmed: Regimen is correct and unchanged.   Changes to medications: Pilot reports no changes at this time.  Changes to insurance: No  New side effects reported not previously addressed with a pharmacist or physician: None reported  Questions for the pharmacist: No    Confirmed patient received a Conservation officer, historic buildings and a Surveyor, mining with first shipment. The patient will receive a drug information handout for each medication shipped and additional FDA Medication Guides as required.       DISEASE/MEDICATION-SPECIFIC INFORMATION        N/A    SPECIALTY MEDICATION ADHERENCE     Medication Adherence    Patient reported X missed doses in the last month: 0  Specialty Medication: TIVICAY 50 mg TABLET (dolutegravir)  Patient is on additional specialty medications: Yes  Additional Specialty Medications: DESCOVY 200-25 mg tablet (emtricitabine-tenofovir alafen)  Patient Reported Additional Medication X Missed Doses in the Last Month: 0  Patient is on more than two specialty medications: No  Informant: patient              Were doses missed due to medication being on hold? No     dolutegravir: TIVICAY 50 mg TABLET: 6 days of medicine on hand    emtricitabine-tenofovir alafen: DESCOVY 200-25 mg tablet: 6 days of medicine on hand       REFERRAL TO PHARMACIST     Referral to the pharmacist: Not needed      SHIPPING     Shipping address confirmed in Epic.       Delivery Scheduled: Yes, Expected medication delivery date: 10/1723.     Medication will be delivered via UPS to the prescription address in Epic WAM.    Craige Cotta   [facility] Specialty and Home Delivery Pharmacy  Specialty Technician

## 2023-11-03 MED FILL — DESCOVY 200 MG-25 MG TABLET: 30 days supply | Qty: 30 | Fill #3

## 2023-11-03 MED FILL — TIVICAY 50 MG TABLET: 30 days supply | Qty: 30 | Fill #3

## 2023-11-07 NOTE — Unmapped (Addendum)
I spoke with Stephenie Acres with [place] [employer]. I let him know that his care was now with Jodi Marble. He said he knew they were just taking a long tome to call back.  They are waiting for the call now.    -------------------------------------------------------------------------------------------------          ----- Message from Varney Daily, FNP sent at 11/07/2023 11:23 AM EST -----    ----- Message -----  From: Salem Senate, RN  Sent: 11/07/2023  10:48 AM EST  To: Mariana Single, FNP      ----- Message -----  From: Cassie Freer  Sent: 11/07/2023  10:47 AM EST  To: #    Incoming Call  Caller: [employer]  Best callback number: [phone]  Reason for call: He stated that patient has passed and needs someone to sign off on death certificate. He has been trying to reach pcp, but no luck.        Thank you!

## 2023-11-09 NOTE — Unmapped (Signed)
Error

## 2023-11-09 NOTE — Unmapped (Signed)
Patient has sadly passed away. [facility] confirmed that patient passed away  on Nov 12, 2023 @9 :36am. Patient will not be routinely followed by [facility].     Duration of Service: 5 minutes    Matty Vanroekel  Linkage and [facility]

## 2023-11-18 DEATH — deceased
